# Patient Record
Sex: Male | Born: 1945
Health system: Southern US, Community
[De-identification: ages and names within clinical notes are randomized; demographics above are authoritative.]

## PROBLEM LIST (undated history)

## (undated) DIAGNOSIS — F988 Other specified behavioral and emotional disorders with onset usually occurring in childhood and adolescence: Secondary | ICD-10-CM

## (undated) DIAGNOSIS — F419 Anxiety disorder, unspecified: Secondary | ICD-10-CM

## (undated) DIAGNOSIS — E785 Hyperlipidemia, unspecified: Secondary | ICD-10-CM

## (undated) DIAGNOSIS — B019 Varicella without complication: Secondary | ICD-10-CM

## (undated) DIAGNOSIS — I10 Essential (primary) hypertension: Secondary | ICD-10-CM

## (undated) DIAGNOSIS — K635 Polyp of colon: Secondary | ICD-10-CM

## (undated) DIAGNOSIS — M1990 Unspecified osteoarthritis, unspecified site: Secondary | ICD-10-CM

## (undated) DIAGNOSIS — R011 Cardiac murmur, unspecified: Secondary | ICD-10-CM

## (undated) DIAGNOSIS — J302 Other seasonal allergic rhinitis: Secondary | ICD-10-CM

## (undated) DIAGNOSIS — Z5189 Encounter for other specified aftercare: Secondary | ICD-10-CM

## (undated) DIAGNOSIS — K8020 Calculus of gallbladder without cholecystitis without obstruction: Secondary | ICD-10-CM

## (undated) DIAGNOSIS — E039 Hypothyroidism, unspecified: Secondary | ICD-10-CM

## (undated) DIAGNOSIS — R739 Hyperglycemia, unspecified: Secondary | ICD-10-CM

## (undated) DIAGNOSIS — E119 Type 2 diabetes mellitus without complications: Secondary | ICD-10-CM

## (undated) DIAGNOSIS — K5790 Diverticulosis of intestine, part unspecified, without perforation or abscess without bleeding: Secondary | ICD-10-CM

## (undated) HISTORY — DX: Unspecified osteoarthritis, unspecified site: M19.90

## (undated) HISTORY — DX: Varicella without complication: B01.9

## (undated) HISTORY — DX: Polyp of colon: K63.5

## (undated) HISTORY — DX: Essential (primary) hypertension: I10

## (undated) HISTORY — DX: Hyperlipidemia, unspecified: E78.5

## (undated) HISTORY — DX: Hyperglycemia, unspecified: R73.9

## (undated) HISTORY — DX: Other specified behavioral and emotional disorders with onset usually occurring in childhood and adolescence: F98.8

## (undated) HISTORY — PX: OTHER SURGICAL HISTORY: SHX169

## (undated) HISTORY — DX: Anxiety disorder, unspecified: F41.9

## (undated) HISTORY — DX: Other seasonal allergic rhinitis: J30.2

## (undated) HISTORY — DX: Cardiac murmur, unspecified: R01.1

## (undated) HISTORY — DX: Calculus of gallbladder without cholecystitis without obstruction: K80.20

## (undated) HISTORY — DX: Encounter for other specified aftercare: Z51.89

## (undated) HISTORY — DX: Diverticulosis of intestine, part unspecified, without perforation or abscess without bleeding: K57.90

## (undated) HISTORY — DX: Hypothyroidism, unspecified: E03.9

## (undated) HISTORY — DX: Type 2 diabetes mellitus without complications: E11.9

---
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
## 2006-01-21 HISTORY — PX: BACK SURGERY: SHX140

## 2007-01-22 HISTORY — PX: OTHER SURGICAL HISTORY: SHX169

## 2010-08-02 ENCOUNTER — Encounter: Payer: Self-pay | Admitting: Family Medicine

## 2012-01-22 HISTORY — PX: OTHER SURGICAL HISTORY: SHX169

## 2012-01-22 HISTORY — PX: CHOLECYSTECTOMY: SHX55

## 2013-07-07 LAB — HM DEXA SCAN

## 2013-10-18 LAB — HM COLONOSCOPY

## 2015-06-30 LAB — HEPATIC FUNCTION PANEL
ALT: 40 (ref 10–40)
AST: 40 (ref 14–40)
BILIRUBIN, TOTAL: 1.3

## 2015-06-30 LAB — BASIC METABOLIC PANEL
BUN: 18 (ref 4–21)
Creatinine: 0.8 (ref 0.6–1.3)
POTASSIUM: 4.5 (ref 3.4–5.3)
Sodium: 137 (ref 137–147)

## 2016-11-01 ENCOUNTER — Encounter: Payer: Self-pay | Admitting: Family Medicine

## 2017-04-08 ENCOUNTER — Encounter: Payer: Self-pay | Admitting: Family Medicine

## 2017-04-08 LAB — HEPATIC FUNCTION PANEL
ALT: 52 — AB (ref 10–40)
AST: 42 — AB (ref 14–40)
Alkaline Phosphatase: 71 (ref 25–125)
Bilirubin, Total: 0.9

## 2017-04-08 LAB — BASIC METABOLIC PANEL
BUN: 18 (ref 4–21)
Creatinine: 0.8 (ref 0.6–1.3)
Glucose: 84
Potassium: 4 (ref 3.4–5.3)
SODIUM: 140 (ref 137–147)

## 2017-04-08 LAB — CBC AND DIFFERENTIAL
HEMATOCRIT: 45 (ref 41–53)
HEMOGLOBIN: 14.9 (ref 13.5–17.5)
Neutrophils Absolute: 3
Platelets: 243 (ref 150–399)
WBC: 5.6

## 2017-04-08 LAB — HEMOGLOBIN A1C: Hemoglobin A1C: 5.9

## 2017-04-08 LAB — PSA: PSA: 1.22

## 2017-04-08 LAB — LIPID PANEL
CHOLESTEROL: 139 (ref 0–200)
HDL: 44 (ref 35–70)
LDL Cholesterol: 84
Triglycerides: 53 (ref 40–160)

## 2017-04-08 LAB — TSH: TSH: 0.02 — AB (ref 0.41–5.90)

## 2017-05-13 ENCOUNTER — Encounter: Payer: Self-pay | Admitting: Family Medicine

## 2017-06-02 ENCOUNTER — Telehealth: Payer: Self-pay | Admitting: Surgical

## 2017-06-02 NOTE — Telephone Encounter (Signed)
This is the patient that I spoke with you about. I have scheduled him to see you on 07/07/17 at 2:45 PM. Thank you for seeing this patient. His daughter is going to come sign release of records to try to get records before appointment.

## 2017-06-02 NOTE — Telephone Encounter (Signed)
Perfect! Thanks. I am willing to accept patient.

## 2017-07-07 ENCOUNTER — Ambulatory Visit (INDEPENDENT_AMBULATORY_CARE_PROVIDER_SITE_OTHER): Payer: PPO | Admitting: Family Medicine

## 2017-07-07 ENCOUNTER — Encounter: Payer: Self-pay | Admitting: Family Medicine

## 2017-07-07 VITALS — BP 126/82 | HR 90 | Temp 98.3°F | Ht 69.0 in | Wt 171.8 lb

## 2017-07-07 DIAGNOSIS — F988 Other specified behavioral and emotional disorders with onset usually occurring in childhood and adolescence: Secondary | ICD-10-CM | POA: Diagnosis not present

## 2017-07-07 DIAGNOSIS — R739 Hyperglycemia, unspecified: Secondary | ICD-10-CM | POA: Diagnosis not present

## 2017-07-07 DIAGNOSIS — E039 Hypothyroidism, unspecified: Secondary | ICD-10-CM | POA: Insufficient documentation

## 2017-07-07 DIAGNOSIS — K635 Polyp of colon: Secondary | ICD-10-CM | POA: Insufficient documentation

## 2017-07-07 DIAGNOSIS — R011 Cardiac murmur, unspecified: Secondary | ICD-10-CM | POA: Diagnosis not present

## 2017-07-07 DIAGNOSIS — J302 Other seasonal allergic rhinitis: Secondary | ICD-10-CM | POA: Insufficient documentation

## 2017-07-07 DIAGNOSIS — I10 Essential (primary) hypertension: Secondary | ICD-10-CM | POA: Diagnosis not present

## 2017-07-07 DIAGNOSIS — M1990 Unspecified osteoarthritis, unspecified site: Secondary | ICD-10-CM | POA: Insufficient documentation

## 2017-07-07 DIAGNOSIS — Z1159 Encounter for screening for other viral diseases: Secondary | ICD-10-CM | POA: Diagnosis not present

## 2017-07-07 DIAGNOSIS — E785 Hyperlipidemia, unspecified: Secondary | ICD-10-CM | POA: Insufficient documentation

## 2017-07-07 DIAGNOSIS — Z8669 Personal history of other diseases of the nervous system and sense organs: Secondary | ICD-10-CM | POA: Diagnosis not present

## 2017-07-07 NOTE — Assessment & Plan Note (Signed)
S:States neurology diagnosed and psychologist confirmed. adderall 30mg - 15mg  BID prescribed by prior pcp A/P: we will try to get records. discussed controlled substance contract, UDS, need for original diagnosis by psychologist or psychiatrist

## 2017-07-07 NOTE — Progress Notes (Signed)
Phone: [phone]  Subjective:  Patient presents today to establish care.  Prior patient in [place], [place]. Chief complaint-noted.   See problem oriented charting  The following were reviewed and entered/updated in epic: Past Medical History:  Diagnosis Date  . Arthritis   . Attention deficit disorder (ADD) in adult    Adderall 30mg  tablet- half tablet twice a day.   . Chicken pox   . Colon polyp    dad died of colon cancer- q5 year colonoscopy. 2018  . Encounter for blood transfusion    related to overtreatment with PCN. became very ill.   [name] Kitchen Heart murmur    Sees cardiology October each year due to murmur after fall- get records. Seen for heart murmur heard 2010. We discused may not need to see cardiology. Every other year stres test  . Hyperglycemia    a1c 5.9 in 03/2017  . Hyperlipidemia    atorvastatin 40mg   . Hypertension    losartan 50, hctz 12.5mg   . Hypothyroidism    levothyroxine 100 mcg 6 days a week as of mid 2019.   . Seasonal allergies    prn antihistamine   Patient Active Problem List   Diagnosis Date Noted  . Attention deficit disorder (ADD) in adult     Priority: High  . Hypertension     Priority: Medium  . Hyperlipidemia     Priority: Medium  . Hypothyroidism     Priority: Medium  . Hyperglycemia     Priority: Medium  . Arthritis 07/07/2017  . Heart murmur   . Seasonal allergies   . Colon polyp    Past Surgical History:  Procedure Laterality Date  . Arthroscopic surgery Left 1986-1996   Damaged cartilage Left knee  . BACK SURGERY Bilateral 2008   Injury due to a fall. 2 fractured vertebrae, 3 fractured ribs fracture L scapulla, mild concussion  . CHOLECYSTECTOMY  2014  . Relocation surgery Right    For right elbow ulna nerve   . REPAIR of torn retina Right 2014  . TONSILECTOMY, ADENOIDECTOMY, BILATERAL MYRINGOTOMY AND TUBES  1958  . vena Laser   2009   Remaoval of varicous veins    Family History  Problem Relation Age of Onset  .  Arthritis Mother        passed at age 34  . Miscarriages / [place] Mother   . Hearing loss Father   . Colon cancer Father        died at 54  . Other Daughter        diabetes educator  . Heart disease Paternal Grandmother   . Miscarriages / [place] Daughter   . Diabetes Mellitus I Daughter        in [place]    Medications- reviewed and updated Current Outpatient Medications  Medication Sig Dispense Refill  . amphetamine-dextroamphetamine (ADDERALL XR) 30 MG 24 hr capsule Take 30 mg by mouth daily.    [name] Kitchen atorvastatin (LIPITOR) 40 MG tablet Take 40 mg by mouth daily.    . calcium carbonate (TUMS - DOSED IN MG ELEMENTAL CALCIUM) 500 MG chewable tablet Chew 1 tablet by mouth daily.    . CHONDROITIN SULFATE PO Take 200 mg by mouth daily.    . DHA-EPA-GLA PO Take 644 mg by mouth daily.    . diclofenac sodium (VOLTAREN) 1 % GEL Apply topically at bedtime.    . Glucosamine HCl 1500 MG TABS Take 1,500 mg by mouth daily.    [name] Kitchen Hyaluronic Acid-Vitamin C (HYALURONIC  ACID PO) Take 3.3 mg by mouth daily.    . Investigational vitamin D 600 UNITS capsule SWOG [id number] Take 625 Units by mouth daily. Take with food.    [name] Kitchen levothyroxine (SYNTHROID, LEVOTHROID) 100 MCG tablet Take 100 mcg by mouth daily before breakfast.    . losartan-hydrochlorothiazide (HYZAAR) 50-12.5 MG tablet Take 1 tablet by mouth daily.    . Magnesium Gluconate (MAGNESIUM 27) 500 (27 Mg) MG TABS Take 20 mg by mouth daily.    . Methylsulfonylmethane (MSM) 750 MG CAPS Take 750 mg by mouth daily.    [name] Kitchen VITAMIN A PO Take 120 mcg by mouth daily.     No current facility-administered medications for this visit.     Allergies-reviewed and updated Not on File  Social History   Social History Narrative   Married. Twin daughters. 4 grandkids (2 each)   Moved from [place] [employer] [place], [place]      Retired from Conservation officer, historic buildings      Hobbies: time with grandkids, enjoys travel    ROS--Full ROS was completed Review of  Systems  Constitutional: Negative for chills and fever.  HENT: Negative for ear discharge and ear pain.   Eyes: Negative for blurred vision and double vision.  Respiratory: Negative for cough and shortness of breath.   Cardiovascular: Negative for chest pain and palpitations.  Gastrointestinal: Negative for abdominal pain and vomiting.  Genitourinary: Negative for dysuria and urgency.  Musculoskeletal: Positive for joint pain. Negative for falls.  Skin: Negative for itching and rash.  Neurological: Negative for tingling and tremors.  Endo/Heme/Allergies: Negative for polydipsia. Does not bruise/bleed easily.  Psychiatric/Behavioral: Negative for hallucinations and substance abuse.   Objective: BP 126/82 (BP Location: Left Arm, Patient Position: Sitting, Cuff Size: Normal)   Pulse 90   Temp 98.3 F (36.8 C) (Oral)   Ht 5\' 9"  (1.753 m)   Wt 171 lb 12.8 oz (77.9 kg)   SpO2 97%   BMI 25.37 kg/m  Gen: NAD, resting comfortably HEENT: Mucous membranes are moist. Oropharynx normal. TM normal. Eyes: sclera and lids normal, PERRLA Neck: no thyromegaly, no cervical lymphadenopathy CV: RRR other than occasional ectopic beat. no murmurs rubs or gallops Lungs: CTAB no crackles, wheeze, rhonchi Abdomen: soft/nontender/nondistended/normal bowel sounds. No rebound or guarding.  Ext: no edema Skin: warm, dry Neuro: 5/5 strength in upper and lower extremities, normal gait, normal reflexes  Assessment/Plan:  Other notes: 1.Patient has been trying to get records from  [facility] in [place], [place]. Epic would not let me request records from outside vendor/careeverywhere. Patient reports website [email or web address]. Hoping we have records by follow up 2. Patient states Gets blood work 6 months and due  sept 2019- tsh, lipids, liver) 3. Reports 03/2017 lumbar, thoracic, cervical films- t11 compression fracture s/p post plate and screw fixation, multilevel cervical degerenative disc  disease, some degenerative disc disease lumbar spine 4. High LFTs- Vitamin E per Dr. Almond Lint 800 IU per day and PCP continued in 2014. Patient wants to continue  Hypertension S: controlled on losartan 50, hctz 12.5mg . Does not want combo pill BP Readings from Last 3 Encounters:  07/07/17 126/82  A/P: We discussed blood pressure goal of <140/90. Continue current meds    Hyperlipidemia S:  controlled on atorvastatin 40mg . Last LDL was 84  A/P: continue current medication. We discussed LFTs being checked with this- he has had to drop atorvastatin level in the past due to LFT elevation and apparently  took vitamin E     Hypothyroidism S: On thyroid medication-levothyroxine 100 mcg 6 days a week- was on 7 days a week but tsh was low A/P: update tsh today  Attention deficit disorder (ADD) in adult S:States neurology diagnosed and psychologist confirmed. adderall 30mg - 15mg  BID prescribed by prior pcp A/P: we will try to get records. discussed controlled substance contract, UDS, need for original diagnosis by psychologist or psychiatrist   Hyperglycemia S:a1c 5.9 in 03/2017.  Has been as high as 6.1 in recent years A/P: Likely update A1c with labs next visit   Heart murmur Sees cardiology October each year due to murmur after fall- get records. Seen for heart murmur heard 2010. We discused may not need to see cardiology. Every other year stress test reported need futher history to determine if needed in future. He seems strongly interested in seeing cardiology  He also mentions possible irregular heartbeat-denies atrial fibrillation-we will get history from records.   Future Appointments  Date Time Provider [place]  10/10/2017  8:15 AM [name], MD [facility]   Return in about 3 months (around 10/07/2017) for physical, come fasting.   Lab/Order associations: History of retinal tear - Plan: Ambulatory referral to Ophthalmology  Hypothyroidism, unspecified  type - Plan: TSH  Encounter for hepatitis C screening test for low risk patient - Plan: Hepatitis C antibody  Return precautions advised. [name], MD

## 2017-07-07 NOTE — Assessment & Plan Note (Signed)
S:  controlled on atorvastatin 40mg . Last LDL was 84  A/P: continue current medication. We discussed LFTs being checked with this- he has had to drop atorvastatin level in the past due to LFT elevation and apparently took vitamin E

## 2017-07-07 NOTE — Assessment & Plan Note (Signed)
S: controlled on losartan 50, hctz 12.5mg . Does not want combo pill BP Readings from Last 3 Encounters:  07/07/17 126/82  A/P: We discussed blood pressure goal of <140/90. Continue current meds

## 2017-07-07 NOTE — Assessment & Plan Note (Signed)
S:a1c 5.9 in 03/2017.  Has been as high as 6.1 in recent years A/P: Likely update A1c with labs next visit

## 2017-07-07 NOTE — Patient Instructions (Addendum)
Health Maintenance Due  Topic Date Due  . Hepatitis C Screening - Today at office visit 11-Feb-1945  . TETANUS/TDAP -Patient Report 2015. Will get records 08/27/1963  . COLONOSCOPY - Patient reported 2018. Will get records 08/27/1994  . PNA vac Low Risk Adult (1 of 2 - PCV13) - Patient Reported 2015. Will get records 08/26/2009   We will call you within two weeks about your referral to Dr. Zenia Resides office. If you do not hear within 3 weeks, give [place] a call.   Please stop by lab before you go

## 2017-07-07 NOTE — Assessment & Plan Note (Addendum)
Sees cardiology October each year due to murmur after fall- get records. Seen for heart murmur heard 2010. We discused may not need to see cardiology. Every other year stress test reported need futher history to determine if needed in future. He seems strongly interested in seeing cardiology  He also mentions possible irregular heartbeat-denies atrial fibrillation-we will get history from records.

## 2017-07-07 NOTE — Assessment & Plan Note (Signed)
S: On thyroid medication-levothyroxine 100 mcg 6 days a week- was on 7 days a week but tsh was low A/P: update tsh today

## 2017-07-08 LAB — HEPATITIS C ANTIBODY
HEP C AB: NONREACTIVE
SIGNAL TO CUT-OFF: 0.04 (ref <1.00)

## 2017-07-08 LAB — TSH: TSH: 0.06 u[IU]/mL — AB (ref 0.35–4.50)

## 2017-07-08 NOTE — Progress Notes (Signed)
Your Hepatitis C test was negative.   Thyroid remains slightly overtreated. Please adjust levothyroxine to 75 mcg and help her set up a repeat tsh under hypothyroidism in 6-8 weeks. Please discontinue his 100 mcg dose.

## 2017-07-10 ENCOUNTER — Other Ambulatory Visit: Payer: Self-pay

## 2017-07-10 ENCOUNTER — Encounter: Payer: Self-pay | Admitting: Family Medicine

## 2017-07-10 DIAGNOSIS — E0590 Thyrotoxicosis, unspecified without thyrotoxic crisis or storm: Secondary | ICD-10-CM

## 2017-07-10 MED ORDER — LEVOTHYROXINE SODIUM 75 MCG PO TABS: 75.0000 ug | ORAL_TABLET | Freq: Every day | ORAL | 3 refills | Status: DC

## 2017-07-10 NOTE — Telephone Encounter (Signed)
I have adjusted the records request for the patient and had a front office representative at [facility] scan the document in the system. Request has been faxed to the information provided by the patient.

## 2017-07-17 ENCOUNTER — Encounter: Payer: Self-pay | Admitting: Family Medicine

## 2017-08-07 ENCOUNTER — Encounter (INDEPENDENT_AMBULATORY_CARE_PROVIDER_SITE_OTHER): Payer: PPO | Admitting: Ophthalmology

## 2017-08-07 DIAGNOSIS — I10 Essential (primary) hypertension: Secondary | ICD-10-CM | POA: Diagnosis not present

## 2017-08-07 DIAGNOSIS — H2513 Age-related nuclear cataract, bilateral: Secondary | ICD-10-CM | POA: Diagnosis not present

## 2017-08-07 DIAGNOSIS — H33021 Retinal detachment with multiple breaks, right eye: Secondary | ICD-10-CM | POA: Diagnosis not present

## 2017-08-07 DIAGNOSIS — H43813 Vitreous degeneration, bilateral: Secondary | ICD-10-CM | POA: Diagnosis not present

## 2017-08-07 DIAGNOSIS — H35033 Hypertensive retinopathy, bilateral: Secondary | ICD-10-CM | POA: Diagnosis not present

## 2017-08-12 ENCOUNTER — Encounter: Payer: Self-pay | Admitting: Family Medicine

## 2017-08-13 ENCOUNTER — Other Ambulatory Visit: Payer: Self-pay

## 2017-08-13 DIAGNOSIS — Z0100 Encounter for examination of eyes and vision without abnormal findings: Secondary | ICD-10-CM

## 2017-08-21 ENCOUNTER — Other Ambulatory Visit: Payer: PPO

## 2017-08-29 ENCOUNTER — Other Ambulatory Visit (INDEPENDENT_AMBULATORY_CARE_PROVIDER_SITE_OTHER): Payer: PPO

## 2017-08-29 DIAGNOSIS — E0590 Thyrotoxicosis, unspecified without thyrotoxic crisis or storm: Secondary | ICD-10-CM | POA: Diagnosis not present

## 2017-08-29 LAB — TSH: TSH: 1.02 u[IU]/mL (ref 0.35–4.50)

## 2017-09-06 ENCOUNTER — Encounter: Payer: Self-pay | Admitting: Family Medicine

## 2017-09-08 ENCOUNTER — Encounter: Payer: Self-pay | Admitting: Family Medicine

## 2017-09-08 ENCOUNTER — Other Ambulatory Visit: Payer: Self-pay

## 2017-09-08 MED ORDER — AMPHETAMINE-DEXTROAMPHETAMINE 30 MG PO TABS: 15.0000 mg | ORAL_TABLET | Freq: Two times a day (BID) | ORAL | 0 refills | Status: DC

## 2017-09-08 MED ORDER — LOSARTAN POTASSIUM-HCTZ 50-12.5 MG PO TABS: 1.0000 | ORAL_TABLET | Freq: Every day | ORAL | 3 refills | Status: DC

## 2017-09-08 NOTE — Telephone Encounter (Signed)
Pt last seen 07/07/2017. See message.

## 2017-09-11 ENCOUNTER — Other Ambulatory Visit: Payer: Self-pay

## 2017-09-11 MED ORDER — LOSARTAN POTASSIUM 50 MG PO TABS: 50.0000 mg | ORAL_TABLET | Freq: Every day | ORAL | 3 refills | Status: DC

## 2017-09-11 MED ORDER — HYDROCHLOROTHIAZIDE 12.5 MG PO TABS: 12.5000 mg | ORAL_TABLET | Freq: Every day | ORAL | 3 refills | Status: DC

## 2017-09-25 ENCOUNTER — Encounter: Payer: Self-pay | Admitting: Family Medicine

## 2017-10-07 ENCOUNTER — Encounter: Payer: Self-pay | Admitting: Family Medicine

## 2017-10-10 ENCOUNTER — Encounter: Payer: PPO | Admitting: Family Medicine

## 2017-10-10 ENCOUNTER — Ambulatory Visit (INDEPENDENT_AMBULATORY_CARE_PROVIDER_SITE_OTHER): Payer: PPO

## 2017-10-10 DIAGNOSIS — Z23 Encounter for immunization: Secondary | ICD-10-CM | POA: Diagnosis not present

## 2017-10-10 NOTE — Patient Instructions (Signed)
Health Maintenance Due  Topic Date Due  . TETANUS/TDAP  08/27/1963  . COLONOSCOPY  08/27/1994  . PNA vac Low Risk Adult (1 of 2 - PCV13) 08/26/2009    No flowsheet data found.

## 2017-10-10 NOTE — Progress Notes (Signed)
Patient in today for Flu shot. VIS given. Administered in left arm. Patient tolerated well

## 2017-10-13 ENCOUNTER — Ambulatory Visit: Payer: PPO

## 2017-10-23 ENCOUNTER — Ambulatory Visit (INDEPENDENT_AMBULATORY_CARE_PROVIDER_SITE_OTHER): Payer: PPO | Admitting: Family Medicine

## 2017-10-23 ENCOUNTER — Encounter: Payer: Self-pay | Admitting: Family Medicine

## 2017-10-23 VITALS — BP 138/78 | HR 71 | Temp 98.0°F | Ht 69.0 in | Wt 174.8 lb

## 2017-10-23 DIAGNOSIS — Z0000 Encounter for general adult medical examination without abnormal findings: Secondary | ICD-10-CM

## 2017-10-23 DIAGNOSIS — Z125 Encounter for screening for malignant neoplasm of prostate: Secondary | ICD-10-CM

## 2017-10-23 DIAGNOSIS — R739 Hyperglycemia, unspecified: Secondary | ICD-10-CM | POA: Diagnosis not present

## 2017-10-23 DIAGNOSIS — R011 Cardiac murmur, unspecified: Secondary | ICD-10-CM

## 2017-10-23 DIAGNOSIS — Z79899 Other long term (current) drug therapy: Secondary | ICD-10-CM | POA: Diagnosis not present

## 2017-10-23 DIAGNOSIS — E039 Hypothyroidism, unspecified: Secondary | ICD-10-CM | POA: Diagnosis not present

## 2017-10-23 DIAGNOSIS — E785 Hyperlipidemia, unspecified: Secondary | ICD-10-CM

## 2017-10-23 DIAGNOSIS — M79671 Pain in right foot: Secondary | ICD-10-CM

## 2017-10-23 LAB — LIPID PANEL
Cholesterol: 131 mg/dL (ref 0–200)
HDL: 42.9 mg/dL (ref >39.00)
LDL Cholesterol: 73 mg/dL (ref 0–99)
NONHDL: 88.59
Total CHOL/HDL Ratio: 3
Triglycerides: 79 mg/dL (ref 0.0–149.0)
VLDL: 15.8 mg/dL (ref 0.0–40.0)

## 2017-10-23 LAB — COMPREHENSIVE METABOLIC PANEL
ALK PHOS: 49 U/L (ref 39–117)
ALT: 39 U/L (ref 0–53)
AST: 40 U/L — AB (ref 0–37)
Albumin: 4.1 g/dL (ref 3.5–5.2)
BILIRUBIN TOTAL: 1 mg/dL (ref 0.2–1.2)
BUN: 17 mg/dL (ref 6–23)
CO2: 35 mEq/L — ABNORMAL HIGH (ref 19–32)
CREATININE: 1 mg/dL (ref 0.40–1.50)
Calcium: 9 mg/dL (ref 8.4–10.5)
Chloride: 99 mEq/L (ref 96–112)
GFR: 77.82 mL/min (ref >60.00)
GLUCOSE: 89 mg/dL (ref 70–99)
Potassium: 3.9 mEq/L (ref 3.5–5.1)
SODIUM: 137 meq/L (ref 135–145)
TOTAL PROTEIN: 6.9 g/dL (ref 6.0–8.3)

## 2017-10-23 LAB — CBC
HCT: 44.8 % (ref 39.0–52.0)
HEMOGLOBIN: 15.1 g/dL (ref 13.0–17.0)
MCHC: 33.7 g/dL (ref 30.0–36.0)
MCV: 89.9 fl (ref 78.0–100.0)
Platelets: 259 10*3/uL (ref 150.0–400.0)
RBC: 4.98 Mil/uL (ref 4.22–5.81)
RDW: 13.9 % (ref 11.5–15.5)
WBC: 5.2 10*3/uL (ref 4.0–10.5)

## 2017-10-23 LAB — TSH: TSH: 0.91 u[IU]/mL (ref 0.35–4.50)

## 2017-10-23 LAB — HEMOGLOBIN A1C: Hgb A1c MFr Bld: 6 % (ref 4.6–6.5)

## 2017-10-23 MED ORDER — ATORVASTATIN CALCIUM 40 MG PO TABS: 40.0000 mg | ORAL_TABLET | Freq: Every day | ORAL | 3 refills | Status: DC

## 2017-10-23 NOTE — Progress Notes (Addendum)
Phone: [phone]  Subjective:  Patient presents today for their annual physical. Chief complaint-noted.   See problem oriented charting- ROS- full  review of systems was completed and negative except for: post nasal drip  The following were reviewed and entered/updated in epic: Past Medical History:  Diagnosis Date  . Arthritis   . Attention deficit disorder (ADD) in adult    Adderall 53m tablet- half tablet twice a day.   . Chicken pox   . Colon polyp    dad died of colon cancer- q5 year colonoscopy. 2018  . Encounter for blood transfusion    related to overtreatment with PCN. became very ill.   .[name] KitchenHeart murmur    Sees cardiology October each year due to murmur after fall- get records. Seen for heart murmur heard 2010. We discused may not need to see cardiology. Every other year stres test  . Hyperglycemia    a1c 5.9 in 03/2017  . Hyperlipidemia    atorvastatin 459m . Hypertension    losartan 50, hctz 12.42m442m. Hypothyroidism    levothyroxine 100 mcg 6 days a week as of mid 2019.   . Seasonal allergies    prn antihistamine   Patient Active Problem List   Diagnosis Date Noted  . Attention deficit disorder (ADD) in adult     Priority: High  . Heart murmur     Priority: High  . Arthritis 07/07/2017    Priority: Medium  . Hypertension     Priority: Medium  . Hyperlipidemia     Priority: Medium  . Hypothyroidism     Priority: Medium  . Hyperglycemia     Priority: Medium  . Seasonal allergies     Priority: Low  . Colon polyp     Priority: Low   Past Surgical History:  Procedure Laterality Date  . Arthroscopic surgery Left 1986-1996   Damaged cartilage Left knee  . BACK SURGERY Bilateral 2008   Injury due to a fall. 2 fractured vertebrae, 3 fractured ribs fracture L scapulla, mild concussion  . CHOLECYSTECTOMY  2014  . Relocation surgery Right    For right elbow ulna nerve   . REPAIR of torn retina Right 2014  . TONSILECTOMY, ADENOIDECTOMY, BILATERAL  MYRINGOTOMY AND TUBES  1958  . vena Laser   2009   Remaoval of varicous veins    Family History  Problem Relation Age of Onset  . Arthritis Mother        passed at age 67 49 Miscarriages / [place]   . Hearing loss Father   . Colon cancer Father        died at 69 78 Other Daughter        diabetes educator  . Heart disease Paternal Grandmother   . Miscarriages / [place]   . Diabetes Mellitus [place] Daughter        [place] Medications- reviewed and updated Current Outpatient Medications  Medication Sig Dispense Refill  . aspirin EC 81 MG tablet Take 81 mg by mouth daily.    . aMarland Kitchenorvastatin (LIPITOR) 40 MG tablet Take 40 mg by mouth daily.    . calcium carbonate (TUMS - DOSED IN MG ELEMENTAL CALCIUM) 500 MG chewable tablet Chew 1 tablet by mouth daily.    . CHONDROITIN SULFATE PO Take 200 mg by mouth daily.    . DHA-EPA-GLA PO Take 690 mg by mouth daily.     . diclofenac sodium (VOLTAREN) 1 % GEL  Apply topically at bedtime.    . Glucosamine HCl 1500 MG TABS Take 1,500 mg by mouth daily.    [name] Kitchen Hyaluronic Acid-Vitamin C (HYALURONIC ACID PO) Take 3.3 mg by mouth daily.    . hydrochlorothiazide (HYDRODIURIL) 12.5 MG tablet Take 1 tablet (12.5 mg total) by mouth daily. 90 tablet 3  . Investigational vitamin D 600 UNITS capsule SWOG S0812 Take 625 Units by mouth daily. Take with food.    [name] Kitchen levothyroxine (SYNTHROID, LEVOTHROID) 75 MCG tablet Take 1 tablet (75 mcg total) by mouth daily. 90 tablet 3  . losartan (COZAAR) 50 MG tablet Take 1 tablet (50 mg total) by mouth daily. 90 tablet 3  . Magnesium Gluconate (MAGNESIUM 27) 500 (27 Mg) MG TABS Take 20 mg by mouth daily.    . Misc Natural Products (TART CHERRY ADVANCED PO) Take 800 mg by mouth daily.    . Probiotic Product (PROBIOTIC ADVANCED PO) Take 1 tablet by mouth daily.    . vitamin E 400 UNIT capsule Take 800 Units by mouth daily.    [name] Kitchen amphetamine-dextroamphetamine (ADDERALL) 30 MG tablet Take 0.5 tablets by  mouth 2 (two) times daily. 30 tablet 0  . amphetamine-dextroamphetamine (ADDERALL) 30 MG tablet Take 0.5 tablets by mouth 2 (two) times daily. 30 tablet 0  . amphetamine-dextroamphetamine (ADDERALL) 30 MG tablet Take 0.5 tablets by mouth 2 (two) times daily. 30 tablet 0   No current facility-administered medications for this visit.     Allergies-reviewed and updated No Known Allergies  Social History   Social History Narrative   Married. Twin daughters. 4 grandkids (2 each)   Moved from [place], [place]      Retired from Production manager: time with grandkids, enjoys travel    Objective: BP 138/78   Pulse 71   Temp 98 F (36.7 C) (Oral)   Ht '5\' 9"'$  (1.753 m)   Wt 174 lb 12.8 oz (79.3 kg)   SpO2 98%   BMI 25.81 kg/m  Gen: NAD, resting comfortably HEENT: Mucous membranes are moist. Oropharynx normal Neck: no thyromegaly CV: RRR no murmurs rubs or gallops Lungs: CTAB no crackles, wheeze, rhonchi Abdomen: soft/nontender/nondistended/normal bowel sounds. No rebound or guarding.  Ext: no edema Skin: warm, dry Neuro: grossly normal, moves all extremities, PERRLA MSK: corn noted at 1st MTP right foot- mild pain outside of this   Rectal deferred  Assessment/Plan:  72 y.o. male presenting for annual physical.  Health Maintenance counseling: 1. Anticipatory guidance: Patient counseled regarding regular dental exams -q6 months (looking at options), eye exams -yearly with [facility], regular optho later this month with [facility], wearing seatbelts.  2. Risk factor reduction:  Advised patient of need for regular exercise and diet rich and fruits and vegetables to reduce risk of heart attack and stroke. Exercise- active in house and yard- no intentional exercise- encouraged him to start this. Diet-Weight up 3 lbs since last visit, thinks weight up due to being slightly less active.  Wt Readings from Last 3 Encounters:  10/23/17 174 lb 12.8 oz  (79.3 kg)  07/07/17 171 lb 12.8 oz (77.9 kg)  3. Immunizations/screenings/ancillary studies- discussed shingrix - had first and will get repeat in 2-6 months..  Immunization History  Administered Date(s) Administered  . Influenza, High Dose Seasonal PF 10/10/2017  . MMR 04/14/2013  . Pneumococcal Conjugate-13 07/01/2013  . Pneumococcal Polysaccharide-23 01/03/2012  . Tdap 05/17/2013  . Zoster 08/02/2010  . Zoster Recombinat (  Shingrix) 10/12/2017  4. Prostate cancer screening- he would like to do at least once a year- we discussed guidelines stop at 70- sounds like he at least wants to go through 75.  PSA trend is low risk- will opt out of rectal until next CPE Lab Results  Component Value Date   PSA 1.22 04/08/2017   5. Colon cancer screening - 2018 colonoscopy with 3 year repeat per prior MD due to father with colon cancer. He will want to do that locally.  6. Skin cancer screening- looking at options for dermatology locally. advised regular sunscreen use. Denies worrisome, changing, or new skin lesions.   Status of chronic or acute concerns   Adult ADD- patient is on 15 mg BID of adderall (splits 45m tablet). This is controlling ADD well- can call in 2 months for next refill -Dr. HBlane Oharaof psychology diagnosed him.  - controlled substance contract today  [place] 1[place] rx through me has been filled  HTN- controlled on hctz 12.574mand losartan 5039mOn repeat- high normal so want to add exercise to get lower on ADD meds. Does not want combo pill.   HLD- on atorvastatin 68m37mpdate lipid panel. Had to drop statin in past due to LFT elevations. Wants to monitor q6 months. Has had liver ultrasound- apparently had gallstone and had to have gallbladder removed  Hypothyroidism- controlled on 75 mcg levothyroxine. Had been on 100 mcg 6 days a week per prior MD  Hyperglycemia- a1c 5.29 March 2017. Update today, encouraged exercise  Heart murmur- has seen  cardiology each October. Echo 10/2016 shows mild mitral regurgitation only. I told him he   Right foot pain S: ruptured tibialis anterior tendon. Has been through PT. Dr. MeekBlanchie Serve prior PCP sent him to orthopedist- CathPhilomena Dohenyatient had molded arch supports made as well as AFO brace- he couldn't wear the AFO. Apparently pronation was cast into mold- started to get corns and callouses at 1st MTP joint. He has customized his own insoles A/P:  Can continue his own custom insoles for now but will refer to podiatry for further evaluation and consideration of new custom insoles.   Future Appointments  Date Time Provider [place]24/2020  8:00 AM MattHayden Pedro [facility] None   Return in about 6 months (around 04/24/2018).  Lab/Order associations: Fasting Preventative health care - Plan: CBC, Comprehensive metabolic panel, Lipid panel, Hemoglobin A1c, TSH, Pain Mgmt, Profile 8 w/Conf, U  Hyperlipidemia, unspecified hyperlipidemia type - Plan: CBC, Comprehensive metabolic panel, Lipid panel  Right foot pain - Plan: Ambulatory referral to Sports Medicine  Screening for prostate cancer  Hyperglycemia - Plan: Hemoglobin A1c  Hypothyroidism, unspecified type - Plan: TSH  High risk medication use - Plan: Pain Mgmt, Profile 8 w/Conf, U  Heart murmur - Plan: Ambulatory referral to Cardiology  Return precautions advised.  StepGarret Reddish

## 2017-10-23 NOTE — Patient Instructions (Addendum)
Please stop by lab before you go  We will call you within two weeks about your referral to Dr. Caryl Comes (they may put you with a different cardiologist but I did specifically request him). If you do not hear within 3 weeks, give [place] a call.   Please schedule a visit with our excellent sports medicine physician Dr. Paulla Fore before you leave at the check out desk so he can further evaluate your right foot pain/callous formation/potential inserts needs  Lets work on exercise to see if we can keep BP even lower so we can remain on ADD meds  If I dont write anything about your atorvastatin new prescription on mychart- send me a message back   Team- please completed controlled substance contract for ADD meds

## 2017-10-23 NOTE — Addendum Note (Signed)
Addended by: [name] on: 10/23/2017 09:07 PM   Modules accepted: Orders

## 2017-10-26 LAB — PAIN MGMT, PROFILE 8 W/CONF, U
6 Acetylmorphine: NEGATIVE ng/mL (ref <10)
ALCOHOL METABOLITES: NEGATIVE ng/mL (ref <500)
Amphetamine: 2485 ng/mL — ABNORMAL HIGH (ref <250)
Amphetamines: POSITIVE ng/mL — AB (ref <500)
BENZODIAZEPINES: NEGATIVE ng/mL (ref <100)
Buprenorphine, Urine: NEGATIVE ng/mL (ref <5)
CREATININE: 135.3 mg/dL
Cocaine Metabolite: NEGATIVE ng/mL (ref <150)
MDMA: NEGATIVE ng/mL (ref <500)
METHAMPHETAMINE: NEGATIVE ng/mL (ref <250)
Marijuana Metabolite: NEGATIVE ng/mL (ref <20)
OXYCODONE: NEGATIVE ng/mL (ref <100)
Opiates: NEGATIVE ng/mL (ref <100)
Oxidant: NEGATIVE ug/mL (ref <200)
PH: 6.84 (ref 4.5–9.0)

## 2017-10-30 ENCOUNTER — Encounter: Payer: Self-pay | Admitting: Sports Medicine

## 2017-10-30 ENCOUNTER — Ambulatory Visit (INDEPENDENT_AMBULATORY_CARE_PROVIDER_SITE_OTHER): Payer: PPO | Admitting: Sports Medicine

## 2017-10-30 VITALS — BP 140/84 | HR 78 | Ht 69.0 in | Wt 178.0 lb

## 2017-10-30 DIAGNOSIS — M7741 Metatarsalgia, right foot: Secondary | ICD-10-CM | POA: Diagnosis not present

## 2017-10-30 DIAGNOSIS — M216X1 Other acquired deformities of right foot: Secondary | ICD-10-CM | POA: Diagnosis not present

## 2017-10-30 DIAGNOSIS — S86811S Strain of other muscle(s) and tendon(s) at lower leg level, right leg, sequela: Secondary | ICD-10-CM

## 2017-10-30 DIAGNOSIS — S86811A Strain of other muscle(s) and tendon(s) at lower leg level, right leg, initial encounter: Secondary | ICD-10-CM | POA: Insufficient documentation

## 2017-10-30 NOTE — Progress Notes (Signed)
Juanda Bond. Cabria Micalizzi, [place] at [facility] [phone]  Marvell Fuller Leevon Upperman. - 72 y.o. male MRN [id number]  Date of birth: 10-25-1945  Visit Date: 10/30/2017  PCP: [name], MD   Referred by: [name], MD   Scribe(s) for today's visit: Josepha Pigg, CMA  SUBJECTIVE:  Aundria Mems. is here for Follow-up (R foot pain)  Referred by: Dr. [name]  HPI: His R foot pain symptoms INITIALLY: Began 2016-2017 and began after tibialis anterior tendon rupture.  Described as mild-severe pain depending on orthotics and activities. He reports a lot of pain when walking while wearing AFO brace.   Worsened with bad insoles.  Improved with appropriate pressure on the ball his foot.  Additional associated symptoms include: Hx of ruptured tibialis anterior tendon, tried AFO brace, caused corns and calloses at first MTP, tenderness to touch skin. He also reports sensitivity on the top of his foot at times. He denies pain when bare footed. Dr. Frann Rider (orhto) said that he was beyond tendon repair.     At this time symptoms are improving compared to onset. He just made adj to current insoles and so fare sx have improved. He reports that this has happened in the past with other adjustments with his insoles but he normally only gets about 1 week of relief until the pain returns.  He has been to PT, tried molded arch supports. He has been wearing a corn pad recently and this seems to have helped with his corns. His current insole does help with pronation. Before he made the last adjustment he started noticing that the pain was spreading across his foot.    REVIEW OF SYSTEMS: Denies night time disturbances. Denies fevers, chills, or night sweats. Denies unexplained weight loss. Denies personal history of cancer. Denies changes in bowel or bladder habits. Denies recent unreported falls. Denies new or worsening dyspnea or  wheezing. Denies headaches or dizziness.  Denies numbness, tingling or weakness  In the extremities.  Denies dizziness or presyncopal episodes Denies lower extremity edema    HISTORY:  Prior history reviewed and updated per electronic medical record.  Social History   Occupational History  . Occupation: Retired   Tobacco Use  . Smoking status: Never Smoker  . Smokeless tobacco: Never Used  Substance and Sexual Activity  . Alcohol use: Not Currently  . Drug use: Never  . Sexual activity: Not Currently   Social History   Social History Narrative   Married. Twin daughters. 4 grandkids (2 each)   Moved from [place], [place]      Retired from Conservation officer, historic buildings      Hobbies: time with grandkids, enjoys travel     Past Medical History:  Diagnosis Date  . Arthritis   . Attention deficit disorder (ADD) in adult    Adderall 30mg  tablet- half tablet twice a day.   . Chicken pox   . Colon polyp    dad died of colon cancer- q5 year colonoscopy. 2018  . Encounter for blood transfusion    related to overtreatment with PCN. became very ill.   [name] Kitchen Heart murmur    Sees cardiology October each year due to murmur after fall- get records. Seen for heart murmur heard 2010. We discused may not need to see cardiology. Every other year stres test  . Hyperglycemia    a1c 5.9 in 03/2017  . Hyperlipidemia  atorvastatin 40mg   . Hypertension    losartan 50, hctz 12.5mg   . Hypothyroidism    levothyroxine 100 mcg 6 days a week as of mid 2019.   . Seasonal allergies    prn antihistamine   Past Surgical History:  Procedure Laterality Date  . Arthroscopic surgery Left 1986-1996   Damaged cartilage Left knee  . BACK SURGERY Bilateral 2008   Injury due to a fall. 2 fractured vertebrae, 3 fractured ribs fracture L scapulla, mild concussion  . CHOLECYSTECTOMY  2014  . Relocation surgery Right    For right elbow ulna nerve   . REPAIR of torn retina Right 2014  . TONSILECTOMY,  ADENOIDECTOMY, BILATERAL MYRINGOTOMY AND TUBES  1958  . vena Laser   2009   Remaoval of varicous veins   family history includes Arthritis in his mother; Colon cancer in his father; Diabetes Mellitus I in his daughter; Hearing loss in his father; Heart disease in his paternal grandmother; 58 / [place] in his daughter and mother; Other in his daughter.  DATA OBTAINED & REVIEWED:   Recent Labs    04/08/17 10/23/17 0858  HGBA1C 5.9 6.0   .   OBJECTIVE:  VS:  HT:5\' 9"  (175.3 cm)   WT:178 lb (80.7 kg)  BMI:26.27    BP:140/84  HR:78bpm  TEMP: ( )  RESP:97 %   PHYSICAL EXAM: CONSTITUTIONAL: Well-developed, Well-nourished and In no acute distress PSYCHIATRIC: Alert & appropriately interactive. and Not depressed or anxious appearing. RESPIRATORY: No increased work of breathing and Trachea Midline EYES: Pupils are equal., EOM intact without nystagmus. and No scleral icterus.  VASCULAR EXAM: Warm and well perfused NEURO: unremarkable  MSK Exam: Right Foot & Ankle Exam: No significant rashes/lesions/ulcerations overlying the examined area No overlying erythema/ecchymosis.  normal nails without lesions General Alignment: Normal alignment & Contours Long Arch: High, rigid Hind Foot Alignment: Normal Posterior Tibialis Recruitment: normal Transverse Arch:   Splay Toe present: No  Hammer Toes present: early Bunion present: early Bunionette present: No  Morton's Callus present: Yes  Palpation:   Navicular: nontender Base of the 5th metatarsal: nontender Posterior aspect of the MEDIAL Malleolus: nontender Posterior aspect of the LATERAL Malleolus: nontender  ROM: Normal, no significant limitations Strength: No significant weakness with Inversion, eversion, dorsiflexion and plantar flexion Ankle Stablity: stable to testing and No significant pain with midfoot abduction  Moderate midfoot bossing.  No focal TTP  ASSESSMENT   1. Metatarsalgia of right foot   2.  Loss of transverse plantar arch of right foot   3. Strain of right tibialis anterior muscle, sequela     PLAN:  Pertinent additional documentation may be included in corresponding procedure notes, imaging studies, problem based documentation and patient instructions.  Procedures:  . None  Medications:  No orders of the defined types were placed in this encounter.  Discussion/Instructions: No problem-specific Assessment & Plan notes found for this encounter.  . Moderate midfoot breakdown. Anterior tib tendon is slightly irregular but inact.  Good home padding construction mickicking a longitudinal/transverse arch support. Continue padding. . Discussed red flag symptoms that warrant earlier emergent evaluation and patient voices understanding. . Activity modifications and the importance of avoiding exacerbating activities (limiting pain to no more than a 4 / 10 during or following activity) recommended and discussed. . >50% of this 30 minutes minute visit spent in direct patient counseling and/or coordination of care. Discussion was focused on education regarding the in discussing the pathoetiology and anticipated clinical course of the above condition.  Follow-up:  . Return if symptoms worsen or fail to improve, for as needed for ongoing issues.   . If any lack of improvement consider: further diagnostic evaluation with plain film X-rays of the foot and/or MSK US of the foot/ankle - Consider custom insoles if persistent symptoms      CMA/ATC served as scribe during this visit. History, Physical, and Plan performed by medical provider. Documentation and orders reviewed and attested to.      Gerda Diss, [place] [facility] Physician

## 2017-10-30 NOTE — Patient Instructions (Signed)
It was great to meet you.  Look [email or web address] for insole options  We can also do custom cushion insoles for you if you are continuing to have issues.

## 2017-11-07 DIAGNOSIS — H43813 Vitreous degeneration, bilateral: Secondary | ICD-10-CM | POA: Diagnosis not present

## 2017-11-07 DIAGNOSIS — H2513 Age-related nuclear cataract, bilateral: Secondary | ICD-10-CM | POA: Diagnosis not present

## 2017-12-29 ENCOUNTER — Encounter: Payer: Self-pay | Admitting: Family Medicine

## 2017-12-29 ENCOUNTER — Ambulatory Visit: Payer: PPO | Admitting: Cardiovascular Disease

## 2017-12-29 ENCOUNTER — Encounter: Payer: Self-pay | Admitting: Cardiovascular Disease

## 2017-12-29 VITALS — BP 138/84 | HR 72 | Ht 68.5 in | Wt 174.4 lb

## 2017-12-29 DIAGNOSIS — I10 Essential (primary) hypertension: Secondary | ICD-10-CM | POA: Diagnosis not present

## 2017-12-29 DIAGNOSIS — E7800 Pure hypercholesterolemia, unspecified: Secondary | ICD-10-CM

## 2017-12-29 DIAGNOSIS — R011 Cardiac murmur, unspecified: Secondary | ICD-10-CM | POA: Diagnosis not present

## 2017-12-29 DIAGNOSIS — I491 Atrial premature depolarization: Secondary | ICD-10-CM | POA: Diagnosis not present

## 2017-12-29 NOTE — Progress Notes (Signed)
Cardiology Office Note:    Date:  01/03/2018   ID:  Darius Fuller Keigan Tafoya., DOB November 08, 1945, MRN [id number]  PCP:  [name], MD  Cardiologist:  No primary care provider on file.  Electrophysiologist:  None   Referring MD: [name], MD   Chief Complaint  Patient presents with  . New Patient (Initial Visit)    referred by Dr [name] for heart murmur. noted ~10 years ago. moved from [place] this May.  Darius Long. is a 72 y.o. male who is being seen today for the evaluation of murmur at the request of [name], MD.   History of Present Illness:    Darius Trier. is a 72 y.o. male with a hx of a cardiac murmur that has been followed by his cardiologist in [place] for the last 10 years (Dr. Ashley Mariner, [facility]).  He reports having yearly echoes and a stress test every other year for this.  Additional problems include hypercholesterolemia, mild hyperglycemia, systemic hypertension, treated hypothyroidism.  Unsure about the results of his cardiac work-up and the cause of the murmur.  He is a retired Civil engineer, contracting.  The patient specifically denies any chest pain at rest exertion, dyspnea at rest or with exertion, orthopnea, paroxysmal nocturnal dyspnea, syncope, palpitations, focal neurological deficits, intermittent claudication, lower extremity edema, unexplained weight gain, cough, hemoptysis or wheezing.  He relates his murmur to a severe fall off a balcony that occurred about 10 years ago.  The murmur was detected shortly after that.  He wonders whether trauma could have caused it.  Additional medical problems include adult ADD, prediabetes, hyperlipidemia on statin therapy but he does not have a history of CAD or peripheral vascular disease, stroke, arrhythmia, syncope.  He had laser vein surgery for varicose veins of his lower extremities in 2009.  He did not have evidence of carotid stenosis on duplex ultrasound performed in  2016.  His most recent labs show a hemoglobin A1c of 5.9%, total cholesterol 139, HDL 44, LDL 84, triglycerides 53 (April 08, 2017).  He has treated hypothyroidism and the dose of thyroid supplement was recently adjusted.    He has had a variety of musculoskeletal problems and is seeing a sports medicine specialist.  He has had arthroscopic surgery at least twice in his left knee.  He had multiple fractured vertebrae and a fracture of the left scapula after his fall 10 years ago  Past Medical History:  Diagnosis Date  . Arthritis   . Attention deficit disorder (ADD) in adult    Adderall 30mg  tablet- half tablet twice a day.   . Chicken pox   . Colon polyp    dad died of colon cancer- q5 year colonoscopy. 2018  . Encounter for blood transfusion    related to overtreatment with PCN. became very ill.   [name] Kitchen Heart murmur    Sees cardiology October each year due to murmur after fall- get records. Seen for heart murmur heard 2010. We discused may not need to see cardiology. Every other year stres test  . Hyperglycemia    a1c 5.9 in 03/2017  . Hyperlipidemia    atorvastatin 40mg   . Hypertension    losartan 50, hctz 12.5mg   . Hypothyroidism    levothyroxine 100 mcg 6 days a week as of mid 2019.   . Seasonal allergies    prn antihistamine    Past Surgical History:  Procedure Laterality Date  . Arthroscopic surgery Left  [id number]   Damaged cartilage Left knee  . BACK SURGERY Bilateral 2008   Injury due to a fall. 2 fractured vertebrae, 3 fractured ribs fracture L scapulla, mild concussion  . CHOLECYSTECTOMY  2014  . Relocation surgery Right    For right elbow ulna nerve   . REPAIR of torn retina Right 2014  . TONSILECTOMY, ADENOIDECTOMY, BILATERAL MYRINGOTOMY AND TUBES  1958  . vena Laser   2009   Remaoval of varicous veins    Current Medications: Current Meds  Medication Sig  . amphetamine-dextroamphetamine (ADDERALL) 30 MG tablet Take 0.5 tablets by mouth 2 (two) times daily.  [name] Kitchen  aspirin EC 81 MG tablet Take 81 mg by mouth daily.  [name] Kitchen atorvastatin (LIPITOR) 40 MG tablet Take 1 tablet (40 mg total) by mouth daily.  . calcium carbonate (TUMS - DOSED IN MG ELEMENTAL CALCIUM) 500 MG chewable tablet Chew 1 tablet by mouth daily.  . CHONDROITIN SULFATE PO Take 200 mg by mouth daily.  . DHA-EPA-GLA PO Take 690 mg by mouth daily.   . diclofenac sodium (VOLTAREN) 1 % GEL Apply topically at bedtime.  . Glucosamine HCl 1500 MG TABS Take 1,500 mg by mouth daily.  [name] Kitchen Hyaluronic Acid-Vitamin C (HYALURONIC ACID PO) Take 3.3 mg by mouth daily.  . hydrochlorothiazide (HYDRODIURIL) 12.5 MG tablet Take 1 tablet (12.5 mg total) by mouth daily.  . Investigational vitamin D 600 UNITS capsule SWOG B3419 Take 625 Units by mouth daily. Take with food.  [name] Kitchen levothyroxine (SYNTHROID, LEVOTHROID) 75 MCG tablet Take 1 tablet (75 mcg total) by mouth daily.  [name] Kitchen losartan (COZAAR) 50 MG tablet Take 1 tablet (50 mg total) by mouth daily.  . Magnesium Gluconate (MAGNESIUM 27) 500 (27 Mg) MG TABS Take 20 mg by mouth daily.  . Misc Natural Products (TART CHERRY ADVANCED PO) Take 800 mg by mouth daily.  . Probiotic Product (PROBIOTIC ADVANCED PO) Take 1 tablet by mouth daily.  . vitamin E 400 UNIT capsule Take 800 Units by mouth daily.     Allergies:   Patient has no known allergies.   Social History   Socioeconomic History  . Marital status: Married    Spouse name: Not on file  . Number of children: Not on file  . Years of education: Not on file  . Highest education level: Not on file  Occupational History  . Occupation: Retired   Scientific laboratory technician  . Financial resource strain: Not on file  . Food insecurity:    Worry: Not on file    Inability: Not on file  . Transportation needs:    Medical: Not on file    Non-medical: Not on file  Tobacco Use  . Smoking status: Never Smoker  . Smokeless tobacco: Never Used  Substance and Sexual Activity  . Alcohol use: Not Currently  . Drug use: Never  .  Sexual activity: Not Currently  Lifestyle  . Physical activity:    Days per week: Not on file    Minutes per session: Not on file  . Stress: Not on file  Relationships  . Social connections:    Talks on phone: Not on file    Gets together: Not on file    Attends religious service: Not on file    Active member of club or organization: Not on file    Attends meetings of clubs or organizations: Not on file    Relationship status: Not on file  Other Topics Concern  . Not on file  Social  History Narrative   Married. Twin daughters. 4 grandkids (2 each)   Moved from [place], [place]      Retired from Conservation officer, historic buildings      Hobbies: time with grandkids, enjoys travel     Family History: The patient's family history includes Arthritis in his mother; Colon cancer in his father; Diabetes Mellitus I in his daughter; Hearing loss in his father; Heart disease in his paternal grandmother; Miscarriages / [place] in his daughter and mother; Other in his daughter.  ROS:   Please see the history of present illness.     All other systems reviewed and are negative.  EKGs/Labs/Other Studies Reviewed:    The following studies were reviewed today: Notes from his primary care provider from [place], labs.  His echocardiograms and stress test are not available for review, no notes available from his cardiologist in [place].  EKG:  EKG is  ordered today.  The ekg ordered today demonstrates sinus rhythm with atrial bigeminy and rightward axis, otherwise normal tracing, QTC 438 ms  Recent Labs: 10/23/2017: ALT 39; BUN 17; Creatinine, Ser 1.00; Hemoglobin 15.1; Platelets 259.0; Potassium 3.9; Sodium 137; TSH 0.91  Recent Lipid Panel    Component Value Date/Time   CHOL 131 10/23/2017 0858   TRIG 79.0 10/23/2017 0858   HDL 42.90 10/23/2017 0858   CHOLHDL 3 10/23/2017 0858   VLDL 15.8 10/23/2017 0858   LDLCALC 73 10/23/2017 0858    Physical Exam:    VS:  BP 138/84    Pulse 72   Ht 5' 8.5" (1.74 m)   Wt 174 lb 6.4 oz (79.1 kg)   BMI 26.13 kg/m     Wt Readings from Last 3 Encounters:  01/01/18 174 lb 3.2 oz (79 kg)  12/29/17 174 lb 6.4 oz (79.1 kg)  10/30/17 178 lb (80.7 kg)     GEN: Appears fit and younger than stated age, well nourished, well developed in no acute distress HEENT: Normal NECK: No JVD; No carotid bruits LYMPHATICS: No lymphadenopathy CARDIAC: Bigeminal rhythm alternating with regular rhythm, early peaking 2/6 aortic ejection murmur radiating towards the carotids but not quite reaching the neck, no diastolic murmurs, rubs, gallops RESPIRATORY:  Clear to auscultation without rales, wheezing or rhonchi  ABDOMEN: Soft, non-tender, non-distended MUSCULOSKELETAL:  No edema; No deformity  SKIN: Warm and dry NEUROLOGIC:  Alert and oriented x 3 PSYCHIATRIC:  Normal affect   ASSESSMENT:    1. Cardiac murmur   2. Hypercholesterolemia   3. Premature atrial contractions   4. Essential hypertension    PLAN:    In order of problems listed above:  1. Murmur: Compatible with aortic sclerosis of mild aortic stenosis (presume the latter since he has had such frequent echoes).  He reports it has been a year since his last study so we will reevaluate with another echocardiogram here. 2. HLP: All lipid parameters are at target range for a patient without known vascular disease. 3. PACs: Asymptomatic, they do not require specific therapy or further investigation at this time. 4. HTN: Slightly above target of 130/80.  At his appointment with Dr. Yong Channel his blood pressure was close to the target range at 126/82.   Medication Adjustments/Labs and Tests Ordered: Current medicines are reviewed at length with the patient today.  Concerns regarding medicines are outlined above.  Orders Placed This Encounter  Procedures  . EKG 12-Lead  . ECHOCARDIOGRAM COMPLETE   No orders of the defined types were placed in this  encounter.   Patient  Instructions  Medication Instructions:  Dr Sallyanne Kuster recommends that you continue on your current medications as directed. Please refer to the Current Medication list given to you today.  If you need a refill on your cardiac medications before your next appointment, please call your pharmacy.   Testing/Procedures: Your physician has requested that you have an echocardiogram. Echocardiography is a painless test that uses sound waves to create images of your heart. It provides your doctor with information about the size and shape of your heart and how well your heart's chambers and valves are working. This procedure takes approximately one hour. There are no restrictions for this procedure.  >> This will be performed at our [facility] location [place], [place] [address] [phone]  Follow-Up: Dr Sallyanne Kuster recommends that you follow-up with him as needed.    Signed, Sanda Klein, MD  01/03/2018 12:44 PM    [place]

## 2017-12-29 NOTE — Telephone Encounter (Signed)
See note  Copied from [place] [phone]. Topic: General - Other >> Dec 29, 2017  1:07 PM Cecelia Byars, NT wrote: Reason for CRM: Marcie Bal from [facility] called and would like a return call the regarding the patient . He states Dr Yong Channel has received his records from his previous provider . Please call her at   , [phone]

## 2017-12-29 NOTE — Patient Instructions (Signed)
Medication Instructions:  Dr Sallyanne Kuster recommends that you continue on your current medications as directed. Please refer to the Current Medication list given to you today.  If you need a refill on your cardiac medications before your next appointment, please call your pharmacy.   Testing/Procedures: Your physician has requested that you have an echocardiogram. Echocardiography is a painless test that uses sound waves to create images of your heart. It provides your doctor with information about the size and shape of your heart and how well your heart's chambers and valves are working. This procedure takes approximately one hour. There are no restrictions for this procedure.  >> This will be performed at our [facility] location [place], [place] [address] [phone]  Follow-Up: Dr Sallyanne Kuster recommends that you follow-up with him as needed.

## 2017-12-30 NOTE — Telephone Encounter (Signed)
Called and spoke with patient and explained that HIM nor Dr Yong Channel have his records. I suggested that he go to [facility] to sign a new Records Release in order for his new cardiologist to get these records. Patient stated understanding.

## 2017-12-30 NOTE — Telephone Encounter (Signed)
Called and spoke with Marcie Bal at [facility]. Neither her or I see records in media tab that have been scanned in. Patient told Marcie Bal that Dr Yong Channel had his records in his hand at this office visit. Will send to Dr Yong Channel to see if he has records.

## 2018-01-01 ENCOUNTER — Encounter: Payer: Self-pay | Admitting: Family Medicine

## 2018-01-01 ENCOUNTER — Ambulatory Visit (INDEPENDENT_AMBULATORY_CARE_PROVIDER_SITE_OTHER): Payer: PPO | Admitting: Family Medicine

## 2018-01-01 VITALS — BP 136/78 | HR 66 | Temp 97.9°F | Ht 68.5 in | Wt 174.2 lb

## 2018-01-01 DIAGNOSIS — I10 Essential (primary) hypertension: Secondary | ICD-10-CM | POA: Diagnosis not present

## 2018-01-01 DIAGNOSIS — F429 Obsessive-compulsive disorder, unspecified: Secondary | ICD-10-CM | POA: Diagnosis not present

## 2018-01-01 MED ORDER — ESCITALOPRAM OXALATE 10 MG PO TABS: 10.0000 mg | ORAL_TABLET | Freq: Every day | ORAL | 2 refills | Status: DC

## 2018-01-01 NOTE — Patient Instructions (Addendum)
I want you to start Lexapro 5 mg-I sent in 10 mg tablets but I want you to break these in half if you can.  Lets reevaluate in 4 to 6 weeks.  If you feel like you are doing really well at 2 weeks without increased anxiety and want to increase to 10 mg then you can  Blood pressure looks okay-continue current medications for now-hopefully will improve with improvements in anxiety  Also want you to try to start regular exercise even if just 10 minutes 5 days a week with goal of 30 minutes 5 days a week  Consider counseling with either [facility] or someone else in the area you feel comfortable with-would love if you have been established by next visit  Taking the medicine as directed and not missing any doses is one of the best things you can do to treat your depression/anxiety/OCD.  Here are some things to keep in mind:  1) Side effects (stomach upset, some increased anxiety) may happen before you notice a benefit.  These side effects typically go away over time. 2) Changes to your dose of medicine or a change in medication all together is sometimes necessary 3) Most people need to be on medication at least 6-12 months 4) Many people will notice an improvement within two weeks but the full effect of the medication can take up to 4-6 weeks 5) Stopping the medication when you start feeling better often results in a return of symptoms 6) If you start having thoughts of hurting yourself or others after starting this medicine, call our office immediately at [phone] or seek care through 911.

## 2018-01-01 NOTE — Assessment & Plan Note (Signed)
S: controlled on losartan 50 mg, hydrochlorothiazide 12.5 mg.  Patient is on ADD medications which can raise blood pressure. Goal last visit was to add exercise- but he has only done this minimally.   Home #s in 130s or low 161W and diastolics in the 96E.  BP Readings from Last 3 Encounters:  01/01/18 136/78  12/29/17 138/84  10/30/17 140/84  A/P: We discussed blood pressure goal of <140/90. Continue current meds: Wants to work on anxiety to see if improved levels will lower blood pressure further

## 2018-01-01 NOTE — Progress Notes (Signed)
Subjective:  Darius Long. is a 72 y.o. year old very pleasant male patient who presents for/with See problem oriented charting ROS-admits to anxiety.  Denies chest pain or shortness of breath.  No fever or chills reported.  No edema.  Past Medical History-  Patient Active Problem List   Diagnosis Date Noted  . Attention deficit disorder (ADD) in adult     Priority: High  . Heart murmur     Priority: High  . Arthritis 07/07/2017    Priority: Medium  . Hypertension     Priority: Medium  . Hyperlipidemia     Priority: Medium  . Hypothyroidism     Priority: Medium  . Hyperglycemia     Priority: Medium  . Seasonal allergies     Priority: Low  . Colon polyp     Priority: Low  . OCD (obsessive compulsive disorder) 01/01/2018  . Strain of right tibialis anterior muscle 10/30/2017    Medications- reviewed and updated Current Outpatient Medications  Medication Sig Dispense Refill  . aspirin EC 81 MG tablet Take 81 mg by mouth daily.    [name] Kitchen atorvastatin (LIPITOR) 40 MG tablet Take 1 tablet (40 mg total) by mouth daily. 90 tablet 3  . calcium carbonate (TUMS - DOSED IN MG ELEMENTAL CALCIUM) 500 MG chewable tablet Chew 1 tablet by mouth daily.    . calcium citrate-vitamin D (CITRACAL+D) 315-200 MG-UNIT tablet Take 1 tablet by mouth 2 (two) times daily.    . CHONDROITIN SULFATE PO Take 200 mg by mouth daily.    . DHA-EPA-GLA PO Take 690 mg by mouth daily.     . diclofenac sodium (VOLTAREN) 1 % GEL Apply topically at bedtime.    . Glucosamine HCl 1500 MG TABS Take 1,500 mg by mouth daily.    [name] Kitchen Hyaluronic Acid-Vitamin C (HYALURONIC ACID PO) Take 3.3 mg by mouth daily.    . hydrochlorothiazide (HYDRODIURIL) 12.5 MG tablet Take 1 tablet (12.5 mg total) by mouth daily. 90 tablet 3  . Investigational vitamin D 600 UNITS capsule SWOG S0812 Take 625 Units by mouth daily. Take with food.    [name] Kitchen levothyroxine (SYNTHROID, LEVOTHROID) 75 MCG tablet Take 1 tablet (75 mcg total) by mouth daily.  90 tablet 3  . losartan (COZAAR) 50 MG tablet Take 1 tablet (50 mg total) by mouth daily. 90 tablet 3  . Magnesium Gluconate (MAGNESIUM 27) 500 (27 Mg) MG TABS Take 20 mg by mouth daily.    . Misc Natural Products (TART CHERRY ADVANCED PO) Take 800 mg by mouth daily.    . Probiotic Product (PROBIOTIC ADVANCED PO) Take 1 tablet by mouth daily.    . vitamin E 400 UNIT capsule Take 800 Units by mouth daily.    [name] Kitchen amphetamine-dextroamphetamine (ADDERALL) 30 MG tablet Take 0.5 tablets by mouth 2 (two) times daily. 30 tablet 0  . escitalopram (LEXAPRO) 10 MG tablet Take 1 tablet (10 mg total) by mouth daily. 30 tablet 2   No current facility-administered medications for this visit.     Objective: BP 136/78 (BP Location: Left Arm, Patient Position: Sitting, Cuff Size: Large)   Pulse 66   Temp 97.9 F (36.6 C) (Oral)   Ht 5' 8.5" (1.74 m)   Wt 174 lb 3.2 oz (79 kg)   SpO2 95%   BMI 26.10 kg/m  Gen: NAD, resting comfortably CV: RRR no murmurs rubs or gallops Lungs: CTAB no crackles, wheeze, rhonchi Abdomen: soft/nontender/nondistended/normal bowel sounds. No rebound or guarding.  Ext:  no edema Skin: warm, dry Neuro: Speech normal, moves all extremities Appears somewhat anxious  Assessment/Plan:  Hypertension S: controlled on losartan 50 mg, hydrochlorothiazide 12.5 mg.  Patient is on ADD medications which can raise blood pressure. Goal last visit was to add exercise- but he has only done this minimally.   Home #s in 130s or low 993Z and diastolics in the 16R.  BP Readings from Last 3 Encounters:  01/01/18 136/78  12/29/17 138/84  10/30/17 140/84  A/P: We discussed blood pressure goal of <140/90. Continue current meds: Wants to work on anxiety to see if improved levels will lower blood pressure further  OCD (obsessive compulsive disorder) S: patient admits to high stress in general. He has not felt like he can deal with stress in productive manner for last 1-2 years if not longer per  wife. In the last 4 months he feels it has been worse and feels like blood pressure has been higher.  Apparently had some anger issues in past. Also treated for ocd/agitation in past with lexapro and helpful- had a stressful event around 2012. Takes it out on his wife when he gets frustrated. Seems to get overfocused on events. Had tried other meds but lexapro most helpful. Does worsen his ED  Counseling in [place] helpful to certain extent.  A/P: Patient reports prior diagnosis of OCD- appears to have poor control right now.  Also appears to have some anxiety-with GAD-7 of 7 and I suspect underreported as well as mild depressed mood with PHQ 9 of 4.  -Add counseling - Start Lexapro 5 mg-see AVS with intention to go to 10 mg - 4 to 6-week follow-up - Add exercise -Discussed possibly reducing Adderall to help with anxiety and blood pressure   Future Appointments  Date Time Provider [place]  01/02/2018  9:30 AM [facility] ECHO 5 [id number]  05/15/2018  8:00 AM Hayden Pedro, MD [facility] None   Meds ordered this encounter  Medications  . escitalopram (LEXAPRO) 10 MG tablet    Sig: Take 1 tablet (10 mg total) by mouth daily.    Dispense:  30 tablet    Refill:  2   Return precautions advised.  [name], MD

## 2018-01-01 NOTE — Assessment & Plan Note (Addendum)
S: patient admits to high stress in general. He has not felt like he can deal with stress in productive manner for last 1-2 years if not longer per wife. In the last 4 months he feels it has been worse and feels like blood pressure has been higher.  Apparently had some anger issues in past. Also treated for ocd/agitation in past with lexapro and helpful- had a stressful event around 2012. Takes it out on his wife when he gets frustrated. Seems to get overfocused on events. Had tried other meds but lexapro most helpful. Does worsen his ED  Counseling in [place] helpful to certain extent.  A/P: Patient reports prior diagnosis of OCD- appears to have poor control right now.  Also appears to have some anxiety-with GAD-7 of 7 and I suspect underreported as well as mild depressed mood with PHQ 9 of 4.  -Add counseling - Start Lexapro 5 mg-see AVS with intention to go to 10 mg - 4 to 6-week follow-up - Add exercise -Discussed possibly reducing Adderall to help with anxiety and blood pressure

## 2018-01-02 ENCOUNTER — Other Ambulatory Visit: Payer: Self-pay

## 2018-01-02 ENCOUNTER — Ambulatory Visit (HOSPITAL_COMMUNITY): Payer: PPO | Attending: Cardiology

## 2018-01-02 DIAGNOSIS — R011 Cardiac murmur, unspecified: Secondary | ICD-10-CM | POA: Diagnosis not present

## 2018-01-12 ENCOUNTER — Encounter: Payer: Self-pay | Admitting: Family Medicine

## 2018-01-15 MED ORDER — AMPHETAMINE-DEXTROAMPHETAMINE 30 MG PO TABS: 15.0000 mg | ORAL_TABLET | Freq: Two times a day (BID) | ORAL | 0 refills | Status: DC

## 2018-02-05 ENCOUNTER — Encounter: Payer: Self-pay | Admitting: Family Medicine

## 2018-02-05 ENCOUNTER — Ambulatory Visit (INDEPENDENT_AMBULATORY_CARE_PROVIDER_SITE_OTHER): Payer: PPO | Admitting: Family Medicine

## 2018-02-05 VITALS — BP 138/72 | HR 82 | Temp 98.3°F | Ht 68.5 in | Wt 175.0 lb

## 2018-02-05 DIAGNOSIS — I10 Essential (primary) hypertension: Secondary | ICD-10-CM | POA: Diagnosis not present

## 2018-02-05 DIAGNOSIS — F988 Other specified behavioral and emotional disorders with onset usually occurring in childhood and adolescence: Secondary | ICD-10-CM

## 2018-02-05 DIAGNOSIS — F429 Obsessive-compulsive disorder, unspecified: Secondary | ICD-10-CM | POA: Diagnosis not present

## 2018-02-05 NOTE — Assessment & Plan Note (Signed)
S: Patient seems to be doing well on Adderall 15 mg twice a day.  He states being able to focus helps him with his anxiety A/P: Stable-continue current medications.  Watching blood pressure carefully-see hypertension section.  Would prefer to reduce ADD meds instead of add blood pressure medication if needed

## 2018-02-05 NOTE — Progress Notes (Signed)
Subjective:  Darius Long. is a 73 y.o. year old very pleasant male patient who presents for/with See problem oriented charting ROS-anxiety improved, no edema.  No suicidal ideation.  No chest pain reported.  Past Medical History-  Patient Active Problem List   Diagnosis Date Noted  . Attention deficit disorder (ADD) in adult     Priority: High  . Heart murmur     Priority: High  . Arthritis 07/07/2017    Priority: Medium  . Hypertension     Priority: Medium  . Hyperlipidemia     Priority: Medium  . Hypothyroidism     Priority: Medium  . Hyperglycemia     Priority: Medium  . Seasonal allergies     Priority: Low  . Colon polyp     Priority: Low  . OCD (obsessive compulsive disorder) 01/01/2018  . Strain of right tibialis anterior muscle 10/30/2017    Medications- reviewed and updated Current Outpatient Medications  Medication Sig Dispense Refill  . amphetamine-dextroamphetamine (ADDERALL) 30 MG tablet Take 0.5 tablets by mouth 2 (two) times daily. 90 tablet 0  . aspirin EC 81 MG tablet Take 81 mg by mouth daily.    [name] Kitchen atorvastatin (LIPITOR) 40 MG tablet Take 1 tablet (40 mg total) by mouth daily. 90 tablet 3  . calcium carbonate (TUMS - DOSED IN MG ELEMENTAL CALCIUM) 500 MG chewable tablet Chew 1 tablet by mouth daily.    . calcium citrate-vitamin D (CITRACAL+D) 315-200 MG-UNIT tablet Take 1 tablet by mouth 2 (two) times daily.    . CHONDROITIN SULFATE PO Take 200 mg by mouth daily.    . DHA-EPA-GLA PO Take 690 mg by mouth daily.     . diclofenac sodium (VOLTAREN) 1 % GEL Apply topically at bedtime.    [name] Kitchen escitalopram (LEXAPRO) 10 MG tablet Take 1 tablet (10 mg total) by mouth daily. 30 tablet 2  . Glucosamine HCl 1500 MG TABS Take 1,500 mg by mouth daily.    [name] Kitchen Hyaluronic Acid-Vitamin C (HYALURONIC ACID PO) Take 3.3 mg by mouth daily.    . hydrochlorothiazide (HYDRODIURIL) 12.5 MG tablet Take 1 tablet (12.5 mg total) by mouth daily. 90 tablet 3  . Investigational  vitamin D 600 UNITS capsule SWOG S0812 Take 625 Units by mouth daily. Take with food.    [name] Kitchen levothyroxine (SYNTHROID, LEVOTHROID) 75 MCG tablet Take 1 tablet (75 mcg total) by mouth daily. 90 tablet 3  . losartan (COZAAR) 50 MG tablet Take 1 tablet (50 mg total) by mouth daily. 90 tablet 3  . Misc Natural Products (TART CHERRY ADVANCED PO) Take 800 mg by mouth daily.    . Probiotic Product (PROBIOTIC ADVANCED PO) Take 1 tablet by mouth daily.    . vitamin E 400 UNIT capsule Take 800 Units by mouth daily.     No current facility-administered medications for this visit.     Objective: BP 138/72 (BP Location: Left Arm, Patient Position: Sitting, Cuff Size: Large)   Pulse 82   Temp 98.3 F (36.8 C) (Oral)   Ht 5' 8.5" (1.74 m)   Wt 175 lb (79.4 kg)   SpO2 97%   BMI 26.22 kg/m  Gen: NAD, resting comfortably CV: RRR no murmurs rubs or gallops Lungs: CTAB no crackles, wheeze, rhonchi Abdomen: soft/nontender/nondistended Ext: no edema Skin: warm, dry  Assessment/Plan:  Hypertension S: controlled on  losartan 50mg , hcz 12.5mg . monitoring closely due to ADD meds- home #s usaully under 140 after some rest (only time that didn't  happen is when he held diuretic for a few days because he didn't have as easy access to the bathroom BP Readings from Last 3 Encounters:  02/05/18 138/72  01/01/18 136/78  12/29/17 138/84  A/P: We discussed blood pressure goal of <140/90. Continue current meds: He asks about increasing medication-I told him lets focus on regular exercise and healthy eating.  I would more strongly consider stopping Adderall and adding more blood pressure medicine- he is worried if he stops adderall that it will increase his anxiety  OCD (obsessive compulsive disorder) S:  Patient has noted a moderate tempering of his anxiety since last visit on 10mg  of lexapro (started at 5 mg).  Anxiety likely related OCD.  PHQ 9 not elevated.  No suicidal ideation - set up 3 appointments with Trey Paula in February - tried to be more active but his objective was to get to [facility] twice a week and walk if couldn't do that- he is discouraged as doesn't feel he has made much progress. Wife has orthopedic issue he has had to help her with - he is going to try not to schedule things in the morning to give him time for exercise.  A/P: Appears slightly improved- we are going to give this some more time in addition to adding counseling.  We opted to follow-up in 2 months to reassess after his visits with Trey Paula of [place] [facility]  Attention deficit disorder (ADD) in adult S: Patient seems to be doing well on Adderall 15 mg twice a day.  He states being able to focus helps him with his anxiety A/P: Stable-continue current medications.  Watching blood pressure carefully-see hypertension section.  Would prefer to reduce ADD meds instead of add blood pressure medication if needed   Future Appointments  Date Time Provider Tool  02/23/2018  2:00 PM Shelor Sherrilyn Rist, [place] [facility] None  03/02/2018 11:00 AM Shelor Sherrilyn Rist, [place] [facility] None  03/09/2018  2:00 PM Shelor Sherrilyn Rist, [place] [facility] None  03/23/2018  1:40 PM [name], MD [facility]  05/15/2018  8:00 AM Hayden Pedro, MD [facility] None   Return precautions advised.  [name], MD

## 2018-02-05 NOTE — Patient Instructions (Addendum)
Glad blood pressure is staying <140/90 overall- thanks for the great job on home monitoring  Seems like lexapro is helping anxiety some. Lets see how things go with Lattie Haw and then check back in 2-3 months from today

## 2018-02-05 NOTE — Assessment & Plan Note (Signed)
S:  Patient has noted a moderate tempering of his anxiety since last visit on 10mg  of lexapro (started at 5 mg).  Anxiety likely related OCD.  PHQ 9 not elevated.  No suicidal ideation - set up 3 appointments with Trey Paula in February - tried to be more active but his objective was to get to [facility] twice a week and walk if couldn't do that- he is discouraged as doesn't feel he has made much progress. Wife has orthopedic issue he has had to help her with - he is going to try not to schedule things in the morning to give him time for exercise.  A/P: Appears slightly improved- we are going to give this some more time in addition to adding counseling.  We opted to follow-up in 2 months to reassess after his visits with Trey Paula of [place] [facility]

## 2018-02-05 NOTE — Assessment & Plan Note (Signed)
S: controlled on  losartan 50mg , hcz 12.5mg . monitoring closely due to ADD meds- home #s usaully under 140 after some rest (only time that didn't happen is when he held diuretic for a few days because he didn't have as easy access to the bathroom BP Readings from Last 3 Encounters:  02/05/18 138/72  01/01/18 136/78  12/29/17 138/84  A/P: We discussed blood pressure goal of <140/90. Continue current meds: He asks about increasing medication-I told him lets focus on regular exercise and healthy eating.  I would more strongly consider stopping Adderall and adding more blood pressure medicine- he is worried if he stops adderall that it will increase his anxiety

## 2018-02-23 ENCOUNTER — Ambulatory Visit (INDEPENDENT_AMBULATORY_CARE_PROVIDER_SITE_OTHER): Payer: PPO | Admitting: Psychology

## 2018-02-23 DIAGNOSIS — F411 Generalized anxiety disorder: Secondary | ICD-10-CM

## 2018-03-02 ENCOUNTER — Ambulatory Visit: Payer: PPO | Admitting: Psychology

## 2018-03-02 DIAGNOSIS — F411 Generalized anxiety disorder: Secondary | ICD-10-CM

## 2018-03-09 ENCOUNTER — Ambulatory Visit: Payer: PPO | Admitting: Psychology

## 2018-03-09 DIAGNOSIS — F411 Generalized anxiety disorder: Secondary | ICD-10-CM

## 2018-03-23 ENCOUNTER — Encounter: Payer: Self-pay | Admitting: Family Medicine

## 2018-03-23 ENCOUNTER — Ambulatory Visit (INDEPENDENT_AMBULATORY_CARE_PROVIDER_SITE_OTHER): Payer: PPO | Admitting: Family Medicine

## 2018-03-23 VITALS — BP 122/72 | HR 69 | Temp 98.4°F | Ht 68.5 in | Wt 179.0 lb

## 2018-03-23 DIAGNOSIS — E785 Hyperlipidemia, unspecified: Secondary | ICD-10-CM

## 2018-03-23 DIAGNOSIS — I10 Essential (primary) hypertension: Secondary | ICD-10-CM

## 2018-03-23 DIAGNOSIS — E039 Hypothyroidism, unspecified: Secondary | ICD-10-CM | POA: Diagnosis not present

## 2018-03-23 DIAGNOSIS — F429 Obsessive-compulsive disorder, unspecified: Secondary | ICD-10-CM | POA: Diagnosis not present

## 2018-03-23 DIAGNOSIS — R739 Hyperglycemia, unspecified: Secondary | ICD-10-CM | POA: Diagnosis not present

## 2018-03-23 MED ORDER — AMPHETAMINE-DEXTROAMPHETAMINE 30 MG PO TABS: 15.0000 mg | ORAL_TABLET | Freq: Two times a day (BID) | ORAL | 0 refills | Status: DC

## 2018-03-23 MED ORDER — ESCITALOPRAM OXALATE 10 MG PO TABS: 10.0000 mg | ORAL_TABLET | Freq: Every day | ORAL | 3 refills | Status: DC

## 2018-03-23 NOTE — Assessment & Plan Note (Signed)
S: Patient has had several meetings with Mirna Mires of [place] [facility].  He remains on Lexapro 10 mg.  He still has OCD tendencies but improved.  Anxiety related to OCD.  GAD7 down from 13 to 9 today. He feels like snapping with his wife has been better with above treatment. He is enjoying working with Trey Paula.  A/P: improved control- opted to continue lexapro 10mg  and continue follow up with Trey Paula, LCSW as this seems to have been helpful.

## 2018-03-23 NOTE — Assessment & Plan Note (Signed)
S: well controlled on atorvastatin 40mg  late last year A/P: Stable. Continue current medications.

## 2018-03-23 NOTE — Patient Instructions (Addendum)
10/24/2018 or later for physical  You can do your annual wellness visit now but we do not yet have a new nurse/healthcoach hired for these yet.   Schedule a lab visit for sometime in April for 6 month labs  No other changes today

## 2018-03-23 NOTE — Progress Notes (Signed)
Phone [phone]   Subjective:  Darius Ibe. is a 73 y.o. year old very pleasant male patient who presents for/with See problem oriented charting ROS- No chest pain or shortness of breath. No headache or blurry vision. No SI.   Past Medical History-  Patient Active Problem List   Diagnosis Date Noted  . OCD (obsessive compulsive disorder) 01/01/2018    Priority: High  . Attention deficit disorder (ADD) in adult     Priority: High  . Heart murmur     Priority: High  . Arthritis 07/07/2017    Priority: Medium  . Hypertension     Priority: Medium  . Hyperlipidemia     Priority: Medium  . Hypothyroidism     Priority: Medium  . Hyperglycemia     Priority: Medium  . Seasonal allergies     Priority: Low  . Colon polyp     Priority: Low  . Strain of right tibialis anterior muscle 10/30/2017    Medications- reviewed and updated Current Outpatient Medications  Medication Sig Dispense Refill  . aspirin EC 81 MG tablet Take 81 mg by mouth daily.    [name] Kitchen atorvastatin (LIPITOR) 40 MG tablet Take 1 tablet (40 mg total) by mouth daily. 90 tablet 3  . calcium carbonate (TUMS - DOSED IN MG ELEMENTAL CALCIUM) 500 MG chewable tablet Chew 1 tablet by mouth daily.    . CHONDROITIN SULFATE PO Take 200 mg by mouth daily.    . DHA-EPA-GLA PO Take 690 mg by mouth daily.     . diclofenac sodium (VOLTAREN) 1 % GEL Apply topically at bedtime.    [name] Kitchen escitalopram (LEXAPRO) 10 MG tablet Take 1 tablet (10 mg total) by mouth daily. 30 tablet 2  . Glucosamine HCl 1500 MG TABS Take 1,500 mg by mouth daily.    [name] Kitchen Hyaluronic Acid-Vitamin C (HYALURONIC ACID PO) Take 3.3 mg by mouth daily.    . hydrochlorothiazide (HYDRODIURIL) 12.5 MG tablet Take 1 tablet (12.5 mg total) by mouth daily. 90 tablet 3  . Investigational vitamin D 600 UNITS capsule SWOG S0812 Take 625 Units by mouth daily. Take with food.    [name] Kitchen levothyroxine (SYNTHROID, LEVOTHROID) 75 MCG tablet Take 1 tablet (75 mcg total) by mouth  daily. 90 tablet 3  . losartan (COZAAR) 50 MG tablet Take 1 tablet (50 mg total) by mouth daily. 90 tablet 3  . Misc Natural Products (TART CHERRY ADVANCED PO) Take 800 mg by mouth daily.    . Probiotic Product (PROBIOTIC ADVANCED PO) Take 1 tablet by mouth daily.    . vitamin E 400 UNIT capsule Take 800 Units by mouth daily.    [name] Kitchen amphetamine-dextroamphetamine (ADDERALL) 30 MG tablet Take 0.5 tablets by mouth 2 (two) times daily. 90 tablet 0   No current facility-administered medications for this visit.      Objective:  BP 122/72 (BP Location: Left Arm, Patient Position: Sitting, Cuff Size: Large)   Pulse 69   Temp 98.4 F (36.9 C) (Oral)   Ht 5' 8.5" (1.74 m)   Wt 179 lb (81.2 kg)   SpO2 97%   BMI 26.82 kg/m  Gen: NAD, resting comfortably CV: RRR no murmurs rubs or gallops Lungs: CTAB no crackles, wheeze, rhonchi Abdomen: soft/nontender/nondistended/slightly overweight Ext: no edema Skin: warm, dry Neuro: normal gait and speech    Assessment and Plan  #Hypertension S: Compliant with losartan 50 mg, hydrochlorothiazide 12.5 mg (already makes urinating more difficult at times. Home readings with wrist cuff primarily  in 130s and 140s- to me appears average is still under 140. Averaged over 33 and was 138.9  He is going to [facility] 3x a week to try to help A/P: controlled today. Continue current rx.  He is going to try an upper arm cuff instead of wrist cuff on home readings   OCD (obsessive compulsive disorder)  S: Patient has had several meetings with Mirna Mires of [place] [facility].  He remains on Lexapro 10 mg.  He still has OCD tendencies but improved.  Anxiety related to OCD.  GAD7 down from 13 to 9 today. He feels like snapping with his wife has been better with above treatment. He is enjoying working with Trey Paula.  A/P: improved control- opted to continue lexapro 10mg  and continue follow up with Trey Paula, LCSW as this seems to have been helpful.    Hyperlipidemia S: well controlled on atorvastatin 40mg  late last year A/P: Stable. Continue current medications.    Future Appointments  Date Time Provider [place]  04/20/2018  1:00 PM Shelor Sherrilyn Rist, [place] [facility] None  04/27/2018  1:00 PM Shelor Sherrilyn Rist, [place] [facility] None  05/15/2018  8:00 AM Hayden Pedro, MD [facility] None   No follow-ups on file.  Lab/Order associations: Obsessive-compulsive disorder, unspecified type  Hyperlipidemia, unspecified hyperlipidemia type  No orders of the defined types were placed in this encounter.   Return precautions advised.  [name], MD

## 2018-03-25 ENCOUNTER — Encounter: Payer: Self-pay | Admitting: Family Medicine

## 2018-04-19 ENCOUNTER — Encounter: Payer: Self-pay | Admitting: Family Medicine

## 2018-04-20 ENCOUNTER — Ambulatory Visit: Payer: PPO | Admitting: Psychology

## 2018-04-22 ENCOUNTER — Other Ambulatory Visit: Payer: Self-pay

## 2018-04-22 MED ORDER — AMPHETAMINE-DEXTROAMPHETAMINE 30 MG PO TABS: 15.0000 mg | ORAL_TABLET | Freq: Two times a day (BID) | ORAL | 0 refills | Status: DC

## 2018-04-22 NOTE — Telephone Encounter (Signed)
Adderall 30mg   #90/0

## 2018-04-27 ENCOUNTER — Ambulatory Visit: Payer: PPO | Admitting: Psychology

## 2018-05-07 ENCOUNTER — Other Ambulatory Visit: Payer: PPO

## 2018-05-15 ENCOUNTER — Encounter (INDEPENDENT_AMBULATORY_CARE_PROVIDER_SITE_OTHER): Payer: PPO | Admitting: Ophthalmology

## 2018-06-02 ENCOUNTER — Encounter: Payer: Self-pay | Admitting: Family Medicine

## 2018-06-09 ENCOUNTER — Encounter: Payer: Self-pay | Admitting: Family Medicine

## 2018-06-11 ENCOUNTER — Encounter (INDEPENDENT_AMBULATORY_CARE_PROVIDER_SITE_OTHER): Payer: PPO | Admitting: Ophthalmology

## 2018-06-29 ENCOUNTER — Other Ambulatory Visit: Payer: Self-pay

## 2018-06-29 ENCOUNTER — Encounter: Payer: Self-pay | Admitting: Family Medicine

## 2018-06-29 MED ORDER — LEVOTHYROXINE SODIUM 75 MCG PO TABS: 75.0000 ug | ORAL_TABLET | Freq: Every day | ORAL | 3 refills | Status: DC

## 2018-06-29 NOTE — Telephone Encounter (Signed)
You may refill and inform patient- please send to pharmacy he wants- HT was loaded- but looks like he may be requesting [place]

## 2018-06-30 MED ORDER — DICLOFENAC SODIUM 1 % TD GEL: TRANSDERMAL | 1 refills | Status: DC

## 2018-07-01 ENCOUNTER — Other Ambulatory Visit: Payer: Self-pay

## 2018-07-01 ENCOUNTER — Other Ambulatory Visit: Payer: Self-pay | Admitting: Family Medicine

## 2018-07-02 ENCOUNTER — Other Ambulatory Visit: Payer: Self-pay

## 2018-07-02 MED ORDER — DICLOFENAC SODIUM 1 % TD GEL: 2.0000 g | Freq: Every day | TRANSDERMAL | 1 refills | Status: DC

## 2018-07-15 ENCOUNTER — Encounter: Payer: Self-pay | Admitting: Family Medicine

## 2018-07-17 MED ORDER — AMPHETAMINE-DEXTROAMPHETAMINE 30 MG PO TABS: 15.0000 mg | ORAL_TABLET | Freq: Two times a day (BID) | ORAL | 0 refills | Status: DC

## 2018-07-24 ENCOUNTER — Encounter: Payer: Self-pay | Admitting: Family Medicine

## 2018-08-04 ENCOUNTER — Encounter (INDEPENDENT_AMBULATORY_CARE_PROVIDER_SITE_OTHER): Payer: PPO | Admitting: Ophthalmology

## 2018-08-05 ENCOUNTER — Encounter: Payer: Self-pay | Admitting: Family Medicine

## 2018-08-08 ENCOUNTER — Encounter: Payer: Self-pay | Admitting: Family Medicine

## 2018-08-18 ENCOUNTER — Other Ambulatory Visit (INDEPENDENT_AMBULATORY_CARE_PROVIDER_SITE_OTHER): Payer: PPO

## 2018-08-18 ENCOUNTER — Other Ambulatory Visit: Payer: Self-pay

## 2018-08-18 DIAGNOSIS — E785 Hyperlipidemia, unspecified: Secondary | ICD-10-CM | POA: Diagnosis not present

## 2018-08-18 DIAGNOSIS — E039 Hypothyroidism, unspecified: Secondary | ICD-10-CM | POA: Diagnosis not present

## 2018-08-18 DIAGNOSIS — R739 Hyperglycemia, unspecified: Secondary | ICD-10-CM | POA: Diagnosis not present

## 2018-08-18 LAB — COMPREHENSIVE METABOLIC PANEL
ALT: 39 U/L (ref 0–53)
AST: 41 U/L — ABNORMAL HIGH (ref 0–37)
Albumin: 4.2 g/dL (ref 3.5–5.2)
Alkaline Phosphatase: 48 U/L (ref 39–117)
BUN: 21 mg/dL (ref 6–23)
CO2: 34 mEq/L — ABNORMAL HIGH (ref 19–32)
Calcium: 8.9 mg/dL (ref 8.4–10.5)
Chloride: 102 mEq/L (ref 96–112)
Creatinine, Ser: 1.01 mg/dL (ref 0.40–1.50)
GFR: 72.21 mL/min (ref >60.00)
Glucose, Bld: 86 mg/dL (ref 70–99)
Potassium: 4.5 mEq/L (ref 3.5–5.1)
Sodium: 138 mEq/L (ref 135–145)
Total Bilirubin: 1.2 mg/dL (ref 0.2–1.2)
Total Protein: 6.4 g/dL (ref 6.0–8.3)

## 2018-08-18 LAB — LDL CHOLESTEROL, DIRECT: Direct LDL: 89 mg/dL

## 2018-08-18 LAB — TSH: TSH: 2.19 u[IU]/mL (ref 0.35–4.50)

## 2018-08-18 LAB — HEMOGLOBIN A1C: Hgb A1c MFr Bld: 6.3 % (ref 4.6–6.5)

## 2018-08-25 ENCOUNTER — Encounter: Payer: Self-pay | Admitting: Family Medicine

## 2018-08-27 ENCOUNTER — Encounter: Payer: Self-pay | Admitting: Family Medicine

## 2018-08-27 MED ORDER — METFORMIN HCL 500 MG PO TABS: 250.0000 mg | ORAL_TABLET | Freq: Every day | ORAL | 3 refills | Status: DC

## 2018-09-03 ENCOUNTER — Encounter: Payer: Self-pay | Admitting: Family Medicine

## 2018-09-08 ENCOUNTER — Encounter (INDEPENDENT_AMBULATORY_CARE_PROVIDER_SITE_OTHER): Payer: PPO | Admitting: Ophthalmology

## 2018-09-08 ENCOUNTER — Other Ambulatory Visit: Payer: Self-pay

## 2018-09-08 DIAGNOSIS — H43813 Vitreous degeneration, bilateral: Secondary | ICD-10-CM | POA: Diagnosis not present

## 2018-09-08 DIAGNOSIS — H33301 Unspecified retinal break, right eye: Secondary | ICD-10-CM | POA: Diagnosis not present

## 2018-09-08 DIAGNOSIS — I10 Essential (primary) hypertension: Secondary | ICD-10-CM

## 2018-09-08 DIAGNOSIS — H35033 Hypertensive retinopathy, bilateral: Secondary | ICD-10-CM | POA: Diagnosis not present

## 2018-09-08 DIAGNOSIS — H2513 Age-related nuclear cataract, bilateral: Secondary | ICD-10-CM

## 2018-09-21 ENCOUNTER — Encounter: Payer: Self-pay | Admitting: Family Medicine

## 2018-09-21 DIAGNOSIS — E039 Hypothyroidism, unspecified: Secondary | ICD-10-CM

## 2018-09-21 DIAGNOSIS — E785 Hyperlipidemia, unspecified: Secondary | ICD-10-CM

## 2018-09-21 DIAGNOSIS — Z0000 Encounter for general adult medical examination without abnormal findings: Secondary | ICD-10-CM

## 2018-09-21 DIAGNOSIS — I10 Essential (primary) hypertension: Secondary | ICD-10-CM

## 2018-09-21 DIAGNOSIS — Z79899 Other long term (current) drug therapy: Secondary | ICD-10-CM

## 2018-09-21 DIAGNOSIS — R739 Hyperglycemia, unspecified: Secondary | ICD-10-CM

## 2018-09-22 ENCOUNTER — Encounter: Payer: Self-pay | Admitting: Family Medicine

## 2018-09-22 ENCOUNTER — Ambulatory Visit (INDEPENDENT_AMBULATORY_CARE_PROVIDER_SITE_OTHER): Payer: PPO

## 2018-09-22 ENCOUNTER — Other Ambulatory Visit: Payer: Self-pay

## 2018-09-22 DIAGNOSIS — Z23 Encounter for immunization: Secondary | ICD-10-CM | POA: Diagnosis not present

## 2018-09-23 NOTE — Telephone Encounter (Signed)
Called pt, schedule lab visit and placed future lab orders.

## 2018-09-27 ENCOUNTER — Other Ambulatory Visit: Payer: Self-pay | Admitting: Family Medicine

## 2018-10-19 ENCOUNTER — Other Ambulatory Visit: Payer: Self-pay | Admitting: Family Medicine

## 2018-10-19 ENCOUNTER — Encounter: Payer: Self-pay | Admitting: Family Medicine

## 2018-10-19 MED ORDER — ATORVASTATIN CALCIUM 40 MG PO TABS: 40.0000 mg | ORAL_TABLET | Freq: Every day | ORAL | 1 refills | Status: DC

## 2018-10-19 NOTE — Telephone Encounter (Signed)
Last OV 03/23/18 Last refill 07/17/18 #90/0 Next OV 02/02/19  Forwarding to Dr. Yong Channel.

## 2018-10-20 NOTE — Telephone Encounter (Signed)
ADD meds need every 6 month office visit.  He needs to be scheduled for follow-up

## 2018-10-20 NOTE — Telephone Encounter (Signed)
Please schedule pt for 42month f/u per Dr.Hunter.

## 2018-10-21 ENCOUNTER — Encounter: Payer: Self-pay | Admitting: Family Medicine

## 2018-10-22 MED ORDER — AMPHETAMINE-DEXTROAMPHETAMINE 30 MG PO TABS: 15.0000 mg | ORAL_TABLET | Freq: Two times a day (BID) | ORAL | 0 refills | Status: DC

## 2018-10-22 NOTE — Addendum Note (Signed)
Addended by: [name] on: 10/22/2018 01:17 PM   Modules accepted: Orders

## 2018-10-27 ENCOUNTER — Encounter: Payer: PPO | Admitting: Family Medicine

## 2018-10-28 ENCOUNTER — Other Ambulatory Visit: Payer: Self-pay | Admitting: Family Medicine

## 2018-11-10 DIAGNOSIS — H2513 Age-related nuclear cataract, bilateral: Secondary | ICD-10-CM | POA: Diagnosis not present

## 2018-11-10 DIAGNOSIS — H43813 Vitreous degeneration, bilateral: Secondary | ICD-10-CM | POA: Diagnosis not present

## 2018-11-10 DIAGNOSIS — H532 Diplopia: Secondary | ICD-10-CM | POA: Diagnosis not present

## 2018-11-25 ENCOUNTER — Encounter: Payer: Self-pay | Admitting: Family Medicine

## 2018-11-27 ENCOUNTER — Other Ambulatory Visit (INDEPENDENT_AMBULATORY_CARE_PROVIDER_SITE_OTHER): Payer: PPO

## 2018-11-27 ENCOUNTER — Other Ambulatory Visit: Payer: Self-pay

## 2018-11-27 DIAGNOSIS — R739 Hyperglycemia, unspecified: Secondary | ICD-10-CM | POA: Diagnosis not present

## 2018-11-27 LAB — HEMOGLOBIN A1C: Hgb A1c MFr Bld: 5.9 % (ref 4.6–6.5)

## 2018-12-01 ENCOUNTER — Ambulatory Visit (INDEPENDENT_AMBULATORY_CARE_PROVIDER_SITE_OTHER): Payer: PPO

## 2018-12-01 ENCOUNTER — Other Ambulatory Visit: Payer: Self-pay

## 2018-12-01 ENCOUNTER — Ambulatory Visit (INDEPENDENT_AMBULATORY_CARE_PROVIDER_SITE_OTHER): Payer: PPO | Admitting: Family Medicine

## 2018-12-01 ENCOUNTER — Encounter: Payer: Self-pay | Admitting: Family Medicine

## 2018-12-01 VITALS — BP 146/84 | HR 81 | Temp 97.9°F | Ht 68.5 in | Wt 182.8 lb

## 2018-12-01 VITALS — BP 142/74 | Temp 97.9°F | Ht 68.5 in | Wt 182.8 lb

## 2018-12-01 DIAGNOSIS — F429 Obsessive-compulsive disorder, unspecified: Secondary | ICD-10-CM | POA: Diagnosis not present

## 2018-12-01 DIAGNOSIS — E039 Hypothyroidism, unspecified: Secondary | ICD-10-CM | POA: Diagnosis not present

## 2018-12-01 DIAGNOSIS — R011 Cardiac murmur, unspecified: Secondary | ICD-10-CM | POA: Diagnosis not present

## 2018-12-01 DIAGNOSIS — R739 Hyperglycemia, unspecified: Secondary | ICD-10-CM | POA: Diagnosis not present

## 2018-12-01 DIAGNOSIS — F988 Other specified behavioral and emotional disorders with onset usually occurring in childhood and adolescence: Secondary | ICD-10-CM

## 2018-12-01 DIAGNOSIS — E785 Hyperlipidemia, unspecified: Secondary | ICD-10-CM | POA: Diagnosis not present

## 2018-12-01 DIAGNOSIS — I10 Essential (primary) hypertension: Secondary | ICD-10-CM

## 2018-12-01 DIAGNOSIS — Z0000 Encounter for general adult medical examination without abnormal findings: Secondary | ICD-10-CM | POA: Diagnosis not present

## 2018-12-01 NOTE — Progress Notes (Signed)
Phone [phone] In person visit   Subjective:   Darius Long. is a 73 y.o. year old very pleasant male patient who presents for/with See problem oriented charting Chief Complaint  Patient presents with  . Follow-up    ROS- No chest pain or shortness of breath. No headache or blurry vision.    Past Medical History-  Patient Active Problem List   Diagnosis Date Noted  . OCD (obsessive compulsive disorder) 01/01/2018    Priority: High  . Attention deficit disorder (ADD) in adult     Priority: High  . Heart murmur     Priority: High  . Arthritis 07/07/2017    Priority: Medium  . Hypertension     Priority: Medium  . Hyperlipidemia     Priority: Medium  . Hypothyroidism     Priority: Medium  . Hyperglycemia     Priority: Medium  . Seasonal allergies     Priority: Low  . Colon polyp     Priority: Low  . Strain of right tibialis anterior muscle 10/30/2017    Medications- reviewed and updated Current Outpatient Medications  Medication Sig Dispense Refill  . amphetamine-dextroamphetamine (ADDERALL) 30 MG tablet Take 0.5 tablets by mouth 2 (two) times daily. 60 tablet 0  . atorvastatin (LIPITOR) 40 MG tablet Take 1 tablet (40 mg total) by mouth daily. 90 tablet 1  . calcium carbonate (TUMS - DOSED IN MG ELEMENTAL CALCIUM) 500 MG chewable tablet Chew 1 tablet by mouth daily.    . CHONDROITIN SULFATE PO Take 200 mg by mouth daily.    . DHA-EPA-GLA PO Take 690 mg by mouth daily.     . diclofenac sodium (VOLTAREN) 1 % GEL APPLY 2 GRAMS TOPICALLY AT BEDTIME 100 g 2  . escitalopram (LEXAPRO) 10 MG tablet Take 1 tablet (10 mg total) by mouth daily. 90 tablet 3  . Glucosamine HCl 1500 MG TABS Take 1,500 mg by mouth daily.    [name] Kitchen Hyaluronic Acid-Vitamin C (HYALURONIC ACID PO) Take 3.3 mg by mouth daily.    . hydrochlorothiazide (HYDRODIURIL) 12.5 MG tablet TAKE ONE TABLET BY MOUTH DAILY 90 tablet 1  . Investigational vitamin D 600 UNITS capsule SWOG S0812 Take 625 Units  by mouth daily. Take with food.    [name] Kitchen levothyroxine (SYNTHROID) 75 MCG tablet TAKE ONE TABLET BY MOUTH DAILY 90 tablet 1  . losartan (COZAAR) 50 MG tablet TAKE ONE TABLET BY MOUTH DAILY 90 tablet 0  . metFORMIN (GLUCOPHAGE) 500 MG tablet Take 0.5 tablets (250 mg total) by mouth daily with breakfast. 45 tablet 3  . Misc Natural Products (TART CHERRY ADVANCED PO) Take 800 mg by mouth daily.    . Probiotic Product (PROBIOTIC ADVANCED PO) Take 1 tablet by mouth daily.    . vitamin E 400 UNIT capsule Take 800 Units by mouth daily.     No current facility-administered medications for this visit.      Objective:  BP (!) 146/84   Pulse 81   Temp 97.9 F (36.6 C) (Temporal)   Ht 5' 8.5" (1.74 m)   Wt 182 lb 12.8 oz (82.9 kg)   SpO2 95%   BMI 27.39 kg/m  Gen: NAD, resting comfortably, wears mask CV: RRR faint murmur Lungs: CTAB no crackles, wheeze, rhonchi Abdomen: soft/nontender/nondistended Ext: no edema Skin: warm, dry    Assessment and Plan   Hyperglycemia S: Patient with significant improvement in A1c down from 6.3-5.9 on Metformin half tablet with breakfast. -weight is up 7 lbs  from earlier this year- thinks needs to cut down on portoins Lab Results  Component Value Date   HGBA1C 5.9 11/27/2018  A/P: Congratulated patient on improvement-we will continue current dose and recheck in 4 to 6 months -has labs in January but will be too soon for a1c so likely would do sometime mid next year again- perhaps June to july  #Attention deficit disorder (ADD) in adult S:Adderall 30mg  1/2 tab twice a day he does not need refill today will send my chart message when needed.  Since the last visit has the patient had any:  Appetite changes? No Unintentional weight loss? No Is medication working well ? Yes Does patient take drug holidays? No Difficulties falling to sleep or maintaining sleep? No Any anxiety?  Yes but no significant increase from baseline Any cardiac issues (fainting or  paliptations)? No Suicidal thoughts? No Changes in health since last visit? No New medications? No Any illicit substance abuse? No Has the patient taken his medication today? Yes A/P: Doing well-continue current medication-if had increase in anxiety in the future may need to consider psychiatry referral  #hyperlipidemia S: compliant with atorvastatin 40 mg daily Lab Results  Component Value Date   CHOL 131 10/23/2017   HDL 42.90 10/23/2017   LDLCALC 73 10/23/2017   LDLDIRECT 89.0 08/18/2018   TRIG 79.0 10/23/2017   CHOLHDL 3 10/23/2017   A/P: Update lipid panel in January hopefully with LDL 70 or less-likely to focus on healthy eating/regular exercise instead of increasing medicine as could also increase diabetes risk at higher dose  #hypothyroidism S: compliant On thyroid medication-levothyroxine 75 mcg Lab Results  Component Value Date   TSH 2.19 08/18/2018  A/P: Stable. Continue current medications.  Since we are getting labs in january-we will get TSH   #hypertension S: compliant with hydrochlorothiazide 12.5 mg, losartan 50 mg.  Home readings high 130s/80s.  Has been taking some naproxen BP Readings from Last 3 Encounters:  12/01/18 (!) 146/84  03/23/18 122/72  02/05/18 138/72  A/P: mild poor control From avs "Continue hctz 12.5mg , losartan 50mg . Try to cut naproxen, get weight back down, monitor home blood pressure and if still over [id number] systolic or over 90 diastolic- we will increase losartan to 100mg - can send me mychart message if running high. Also love your idea of going with family on the walks. " - did have coffee this AM and naproxen last night which could both potentially increase BP  #OCD/anxiety S: Has seen Trey Paula in the past- last visit pre covid.  Continues on Lexapro 10 mg. Feels reasonable control- projects can get to him. Does find working outside therapeutic  No SI.  A/P: Stable. Continue current medications.  - could go back to [facility] in future if  needed    Recommended follow up:  January physical planned  Future Appointments  Date Time Provider [place]  01/29/2019  9:00 AM [facility]  02/02/2019  9:40 AM [name], MD [facility]  09/14/2019  9:15 AM Hayden Pedro, MD [facility] None    Lab/Order associations:   ICD-10-CM   1. Hyperglycemia  R73.9   2. Attention deficit disorder (ADD) in adult  F98.8   3. Obsessive-compulsive disorder, unspecified type  F42.9   4. Hypothyroidism, unspecified type  E03.9   5. Essential hypertension  I10   6. Hyperlipidemia, unspecified hyperlipidemia type  E78.5    Return precautions advised.  [name], MD

## 2018-12-01 NOTE — Progress Notes (Signed)
I have reviewed and agree with note, evaluation, plan with 1 exception  Courtney-please update your note-patient does not have diabetes.  Has insulin resistance/hyperglycemia/prediabetes but not diabetes.  He is on Metformin for diabetes prevention  [name], MD

## 2018-12-01 NOTE — Progress Notes (Signed)
Subjective:   Darius Long. is a 73 y.o. male who presents for an Initial Medicare Annual Wellness Visit.  Review of Systems   Cardiac Risk Factors include: advanced age (>44mn, >>41women);male gender;hypertension;dyslipidemia   Objective:    Today's Vitals   12/01/18 1132  BP: (!) 142/74  Temp: 97.9 F (36.6 C)  Weight: 182 lb 12.2 oz (82.9 kg)  Height: 5' 8.5" (1.74 m)   Body mass index is 27.38 kg/m.  Advanced Directives 12/01/2018  Does Patient Have a Medical Advance Directive? Yes  Type of Advance Directive Living will;Healthcare Power of Attorney  Does patient want to make changes to medical advance directive? No - Patient declined  [place]? Yes - validated most recent copy scanned in chart (See row information)    Current Medications (verified) Outpatient Encounter Medications as of 12/01/2018  Medication Sig  . amphetamine-dextroamphetamine (ADDERALL) 30 MG tablet Take 0.5 tablets by mouth 2 (two) times daily.  .[name] Kitchenatorvastatin (LIPITOR) 40 MG tablet Take 1 tablet (40 mg total) by mouth daily.  . calcium carbonate (TUMS - DOSED IN MG ELEMENTAL CALCIUM) 500 MG chewable tablet Chew 1 tablet by mouth daily.  . CHONDROITIN SULFATE PO Take 200 mg by mouth daily.  . DHA-EPA-GLA PO Take 690 mg by mouth daily.   . diclofenac sodium (VOLTAREN) 1 % GEL APPLY 2 GRAMS TOPICALLY AT BEDTIME  . escitalopram (LEXAPRO) 10 MG tablet Take 1 tablet (10 mg total) by mouth daily.  . Glucosamine HCl 1500 MG TABS Take 1,500 mg by mouth daily.  .[name] KitchenHyaluronic Acid-Vitamin C (HYALURONIC ACID PO) Take 3.3 mg by mouth daily.  . hydrochlorothiazide (HYDRODIURIL) 12.5 MG tablet TAKE ONE TABLET BY MOUTH DAILY  . Investigational vitamin D 600 UNITS capsule SWOG S0812 Take 625 Units by mouth daily. Take with food.  .[name] Kitchenlevothyroxine (SYNTHROID) 75 MCG tablet TAKE ONE TABLET BY MOUTH DAILY  . losartan (COZAAR) 50 MG tablet TAKE ONE TABLET BY MOUTH  DAILY  . metFORMIN (GLUCOPHAGE) 500 MG tablet Take 0.5 tablets (250 mg total) by mouth daily with breakfast.  . Misc Natural Products (TART CHERRY ADVANCED PO) Take 800 mg by mouth daily.  . Probiotic Product (PROBIOTIC ADVANCED PO) Take 1 tablet by mouth daily.  . vitamin E 400 UNIT capsule Take 800 Units by mouth daily.   No facility-administered encounter medications on file as of 12/01/2018.     Allergies (verified) Patient has no known allergies.   History: Past Medical History:  Diagnosis Date  . Arthritis   . Attention deficit disorder (ADD) in adult    Adderall 357mtablet- half tablet twice a day.   . Chicken pox   . Colon polyp    dad died of colon cancer- q5 year colonoscopy. 2018  . Encounter for blood transfusion    related to overtreatment with PCN. became very ill.   . [name] Kitcheneart murmur    Sees cardiology October each year due to murmur after fall- get records. Seen for heart murmur heard 2010. We discused may not need to see cardiology. Every other year stres test  . Hyperglycemia    a1c 5.9 in 03/2017  . Hyperlipidemia    atorvastatin 40101m. Hypertension    losartan 50, hctz 12.5mg46m Hypothyroidism    levothyroxine 100 mcg 6 days a week as of mid 2019.   . Seasonal allergies    prn antihistamine   Past Surgical History:  Procedure Laterality Date  . Arthroscopic surgery Left 1986-1996   Damaged cartilage Left knee  . BACK SURGERY Bilateral 2008   Injury due to a fall. 2 fractured vertebrae, 3 fractured ribs fracture L scapulla, mild concussion  . CHOLECYSTECTOMY  2014  . Relocation surgery Right    For right elbow ulna nerve   . REPAIR of torn retina Right 2014  . TONSILECTOMY, ADENOIDECTOMY, BILATERAL MYRINGOTOMY AND TUBES  1958  . vena Laser   2009   Remaoval of varicous veins   Family History  Problem Relation Age of Onset  . Arthritis Mother        passed at age 14  . Miscarriages / [place] Mother   . Hearing loss Father   . Colon cancer  Father        died at 14  . Other Daughter        diabetes educator  . Heart disease Paternal Grandmother   . Miscarriages / [place] Daughter   . Diabetes Mellitus I Daughter        in [place] History   Socioeconomic History  . Marital status: Married    Spouse name: Not on file  . Number of children: Not on file  . Years of education: Not on file  . Highest education level: Not on file  Occupational History  . Occupation: Retired   Scientific laboratory technician  . Financial resource strain: Not on file  . Food insecurity    Worry: Not on file    Inability: Not on file  . Transportation needs    Medical: Not on file    Non-medical: Not on file  Tobacco Use  . Smoking status: Never Smoker  . Smokeless tobacco: Never Used  Substance and Sexual Activity  . Alcohol use: Not Currently  . Drug use: Never  . Sexual activity: Not Currently  Lifestyle  . Physical activity    Days per week: Not on file    Minutes per session: Not on file  . Stress: Not on file  Relationships  . Social Herbalist on phone: Not on file    Gets together: Not on file    Attends religious service: Not on file    Active member of club or organization: Not on file    Attends meetings of clubs or organizations: Not on file    Relationship status: Not on file  Other Topics Concern  . Not on file  Social History Narrative   Married. Twin daughters. 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 bring in CBG monitor from home?: No  How often do you need to have someone help you when you read instructions, pamphlets, or other written materials from your doctor or pharmacy?: 1 - Never  Interpreter Needed?:  No  Information entered by :: Denman George LPN  Activities of Daily Living In your present state of health, do you have any difficulty performing the following activities: 12/01/2018  Hearing? N  Vision? N  Difficulty concentrating or making decisions? N  Walking or climbing stairs? N  Dressing or bathing? N  Doing errands, shopping? N  Preparing Food  and eating ? N  Using the Toilet? N  In the past six months, have you accidently leaked urine? N  Do you have problems with loss of bowel control? N  Managing your Medications? N  Managing your Finances? N  Housekeeping or managing your Housekeeping? N  Some recent data might be hidden     Immunizations and Health Maintenance Immunization History  Administered Date(s) Administered  . Fluad Quad(high Dose 65+) 09/22/2018  . Influenza, High Dose Seasonal PF 10/10/2017  . MMR 04/14/2013  . Pneumococcal Conjugate-13 07/01/2013  . Pneumococcal Polysaccharide-23 01/03/2012  . Tdap 05/17/2013  . Zoster 08/02/2010  . Zoster Recombinat (Shingrix) 10/12/2017, 02/17/2018   There are no preventive care reminders to display for this patient.  Patient Care Team: [name], MD as PCP - General (Family Medicine) [facility] as Consulting Physician Croitoru, Dani Gobble, MD as Consulting Physician (Cardiology) Hayden Pedro, MD as Consulting Physician (Ophthalmology)  Indicate any recent Medical Services you may have received from other than [facility] providers in the past year (date may be approximate).    Assessment:   This is a routine wellness examination for Yovan.  Hearing/Vision screen No exam data present  Dietary issues and exercise activities discussed: Current Exercise Habits: Home exercise routine, Type of exercise: walking(yardwork), Time (Minutes): 45, Frequency (Times/Week): 3, Weekly Exercise (Minutes/Week): 135, Intensity: Mild  Goals   None    Depression Screen PHQ 2/9 Scores 02/05/2018  01/01/2018 10/23/2017  PHQ - 2 Score 1 1 0  PHQ- 9 Score 3 4 -    Fall Risk Fall Risk  12/01/2018 10/23/2017  Falls in the past year? 0 No  Injury with Fall? 0 -  Follow up Falls evaluation completed;Education provided;Falls prevention discussed -    Is the patient's home free of loose throw rugs in walkways, pet beds, electrical cords, etc?   yes      Grab bars in the bathroom? yes      Handrails on the stairs?   yes      Adequate lighting?   yes  Timed Get Up and Go performed: completed and within normal timeframe; no gait abnormalities noted   Cognitive Function: no cognitive concerns at this time    6CIT Screen 12/01/2018  What Year? 0 points  What month? 0 points  What time? 0 points  Count back from 20 0 points  Months in reverse 0 points  Repeat phrase 0 points  Total Score 0    Screening Tests Health Maintenance  Topic Date Due  . TETANUS/TDAP  05/18/2023  . COLONOSCOPY  10/19/2023  . INFLUENZA VACCINE  Completed  . Hepatitis C Screening  Completed  . PNA vac Low Risk Adult  Completed    Qualifies for Shingles Vaccine? Shingrix completed   Cancer Screenings: Lung: Low Dose CT Chest recommended if Age 72-80 years, 30 pack-year currently smoking OR have quit w/in 15years. Patient does not qualify. Colorectal: colonoscopy 10/18/13     Plan:  I have personally reviewed and addressed the Medicare Annual Wellness questionnaire and have noted the following in the patient's chart:  A. Medical and social history B. Use of alcohol, tobacco or illicit drugs  C. Current medications and supplements D. Functional ability and status E.  Nutritional status F.  Physical activity G. Advance directives H. List of other physicians I.  Hospitalizations, surgeries, and ER visits in previous 12 months J.  [place] such as hearing and vision if needed, cognitive and  depression L. Referrals, records requested, and appointments- none   In addition, I have reviewed  and discussed with patient certain preventive protocols, quality metrics, and best practice recommendations. A written personalized care plan for preventive services as well as general preventive health recommendations were provided to patient.   Signed,  Denman George, LPN  Nurse Health Advisor   Nurse Notes: no additional

## 2018-12-01 NOTE — Patient Instructions (Addendum)
Continue hctz 12.5mg , losartan 50mg . Try to cut naproxen out, get weight back down, monitor home blood pressure and if still over [id number] systolic or over 90 diastolic- we will increase losartan to 100mg - can send me mychart message if running high. Also love your idea of going with family on the walks.   Glad you have labs planned for January and we will follow up on those a few days later on 02/01/2018

## 2018-12-01 NOTE — Patient Instructions (Signed)
Darius Long , Thank you for taking time to come for your Medicare Wellness Visit. I appreciate your ongoing commitment to your health goals. Please review the following plan we discussed and let me know if I can assist you in the future.   Screening recommendations/referrals: Colorectal Screening: up to date; last colonscopy 10/18/13  Vision and Dental Exams: Recommended annual ophthalmology exams for early detection of glaucoma and other disorders of the eye Recommended annual dental exams for proper oral hygiene  Diabetic Exams: Diabetic Eye Exam: recommended yearly; up to date Diabetic Foot Exam: recommended yearly; done today  Vaccinations: Influenza vaccine: completed 09/22/18 /Pneumococcal vaccine: up to date; last 05/17/13 Tdap vaccine: up to date; last 05/17/13 Shingles vaccine: Shingrix completed   Advanced directives: We have received a copy of your POA (Power of [place]) and/or Living Will. These documents can be located in your chart.  Goals: Recommend to drink at least 6-8 8oz glasses of water per day and consume a balanced diet rich in fresh fruits and vegetables.   Next appointment: Please schedule your Annual Wellness Visit with your Nurse Health Advisor in one year.  Preventive Care 63 Years and Older, Male Preventive care refers to lifestyle choices and visits with your health care provider that can promote health and wellness. What does preventive care include?  A yearly physical exam. This is also called an annual well check.  Dental exams once or twice a year.  Routine eye exams. Ask your health care provider how often you should have your eyes checked.  Personal lifestyle choices, including:  Daily care of your teeth and gums.  Regular physical activity.  Eating a healthy diet.  Avoiding tobacco and drug use.  Limiting alcohol use.  Practicing safe sex.  Taking low doses of aspirin every day if recommended by your health care provider..  Taking  vitamin and mineral supplements as recommended by your health care provider. What happens during an annual well check? The services and screenings done by your health care provider during your annual well check will depend on your age, overall health, lifestyle risk factors, and family history of disease. Counseling  Your health care provider may ask you questions about your:  Alcohol use.  Tobacco use.  Drug use.  Emotional well-being.  Home and relationship well-being.  Sexual activity.  Eating habits.  History of falls.  Memory and ability to understand (cognition).  Work and work Statistician. Screening  You may have the following tests or measurements:  Height, weight, and BMI.  Blood pressure.  Lipid and cholesterol levels. These may be checked every 5 years, or more frequently if you are over 74 years old.  Skin check.  Lung cancer screening. You may have this screening every year starting at age 74 if you have a 30-pack-year history of smoking and currently smoke or have quit within the past 15 years.  Fecal occult blood test (FOBT) of the stool. You may have this test every year starting at age 74.  Flexible sigmoidoscopy or colonoscopy. You may have a sigmoidoscopy every 5 years or a colonoscopy every 10 years starting at age 74.  Prostate cancer screening. Recommendations will vary depending on your family history and other risks.  Hepatitis C blood test.  Hepatitis B blood test.  Sexually transmitted disease (STD) testing.  Diabetes screening. This is done by checking your blood sugar (glucose) after you have not eaten for a while (fasting). You may have this done every 1-3 years.  Abdominal aortic  aneurysm (AAA) screening. You may need this if you are a current or former smoker.  Osteoporosis. You may be screened starting at age 74 if you are at high risk. Talk with your health care provider about your test results, treatment options, and if  necessary, the need for more tests. Vaccines  Your health care provider may recommend certain vaccines, such as:  Influenza vaccine. This is recommended every year.  Tetanus, diphtheria, and acellular pertussis (Tdap, Td) vaccine. You may need a Td booster every 10 years.  Zoster vaccine. You may need this after age 40.  Pneumococcal 13-valent conjugate (PCV13) vaccine. One dose is recommended after age 61.  Pneumococcal polysaccharide (PPSV23) vaccine. One dose is recommended after age 61. Talk to your health care provider about which screenings and vaccines you need and how often you need them. This information is not intended to replace advice given to you by your health care provider. Make sure you discuss any questions you have with your health care provider. Document Released: 02/03/2015 Document Revised: 09/27/2015 Document Reviewed: 11/08/2014 Elsevier Interactive Patient Education  2017 [place] Prevention in the Home Falls can cause injuries. They can happen to people of all ages. There are many things you can do to make your home safe and to help prevent falls. What can I do on the outside of my home?  Regularly fix the edges of walkways and driveways and fix any cracks.  Remove anything that might make you trip as you walk through a door, such as a raised step or threshold.  Trim any bushes or trees on the path to your home.  Use bright outdoor lighting.  Clear any walking paths of anything that might make someone trip, such as rocks or tools.  Regularly check to see if handrails are loose or broken. Make sure that both sides of any steps have handrails.  Any raised decks and porches should have guardrails on the edges.  Have any leaves, snow, or ice cleared regularly.  Use sand or salt on walking paths during winter.  Clean up any spills in your garage right away. This includes oil or grease spills. What can I do in the bathroom?  Use night lights.   Install grab bars by the toilet and in the tub and shower. Do not use towel bars as grab bars.  Use non-skid mats or decals in the tub or shower.  If you need to sit down in the shower, use a plastic, non-slip stool.  Keep the floor dry. Clean up any water that spills on the floor as soon as it happens.  Remove soap buildup in the tub or shower regularly.  Attach bath mats securely with double-sided non-slip rug tape.  Do not have throw rugs and other things on the floor that can make you trip. What can I do in the bedroom?  Use night lights.  Make sure that you have a light by your bed that is easy to reach.  Do not use any sheets or blankets that are too big for your bed. They should not hang down onto the floor.  Have a firm chair that has side arms. You can use this for support while you get dressed.  Do not have throw rugs and other things on the floor that can make you trip. What can I do in the kitchen?  Clean up any spills right away.  Avoid walking on wet floors.  Keep items that you use a lot  in easy-to-reach places.  If you need to reach something above you, use a strong step stool that has a grab bar.  Keep electrical cords out of the way.  Do not use floor polish or wax that makes floors slippery. If you must use wax, use non-skid floor wax.  Do not have throw rugs and other things on the floor that can make you trip. What can I do with my stairs?  Do not leave any items on the stairs.  Make sure that there are handrails on both sides of the stairs and use them. Fix handrails that are broken or loose. Make sure that handrails are as long as the stairways.  Check any carpeting to make sure that it is firmly attached to the stairs. Fix any carpet that is loose or worn.  Avoid having throw rugs at the top or bottom of the stairs. If you do have throw rugs, attach them to the floor with carpet tape.  Make sure that you have a light switch at the top of the  stairs and the bottom of the stairs. If you do not have them, ask someone to add them for you. What else can I do to help prevent falls?  Wear shoes that:  Do not have high heels.  Have rubber bottoms.  Are comfortable and fit you well.  Are closed at the toe. Do not wear sandals.  If you use a stepladder:  Make sure that it is fully opened. Do not climb a closed stepladder.  Make sure that both sides of the stepladder are locked into place.  Ask someone to hold it for you, if possible.  Clearly mark and make sure that you can see:  Any grab bars or handrails.  First and last steps.  Where the edge of each step is.  Use tools that help you move around (mobility aids) if they are needed. These include:  Canes.  Walkers.  Scooters.  Crutches.  Turn on the lights when you go into a dark area. Replace any light bulbs as soon as they burn out.  Set up your furniture so you have a clear path. Avoid moving your furniture around.  If any of your floors are uneven, fix them.  If there are any pets around you, be aware of where they are.  Review your medicines with your doctor. Some medicines can make you feel dizzy. This can increase your chance of falling. Ask your doctor what other things that you can do to help prevent falls. This information is not intended to replace advice given to you by your health care provider. Make sure you discuss any questions you have with your health care provider. Document Released: 11/03/2008 Document Revised: 06/15/2015 Document Reviewed: 02/11/2014 Elsevier Interactive Patient Education  2017 [employer].

## 2018-12-02 NOTE — Progress Notes (Signed)
Note addended- patient is not diabetic.  Updated to reflect prediabetes

## 2018-12-02 NOTE — Progress Notes (Signed)
Note corrected

## 2018-12-16 ENCOUNTER — Other Ambulatory Visit: Payer: Self-pay

## 2018-12-20 ENCOUNTER — Encounter: Payer: Self-pay | Admitting: Family Medicine

## 2018-12-21 MED ORDER — AMPHETAMINE-DEXTROAMPHETAMINE 30 MG PO TABS: 15.0000 mg | ORAL_TABLET | Freq: Two times a day (BID) | ORAL | 0 refills | Status: DC

## 2018-12-21 MED ORDER — LOSARTAN POTASSIUM 50 MG PO TABS: 50.0000 mg | ORAL_TABLET | Freq: Every day | ORAL | 0 refills | Status: DC

## 2018-12-21 NOTE — Telephone Encounter (Signed)
F/u App 02-02-2019 Last app 12/01/2018 Last script 10/22/2018 #60 with no r/f.  Last script was for 0.5 tab twice a day but was given 60?

## 2019-01-29 ENCOUNTER — Other Ambulatory Visit (INDEPENDENT_AMBULATORY_CARE_PROVIDER_SITE_OTHER): Payer: PPO

## 2019-01-29 DIAGNOSIS — Z0000 Encounter for general adult medical examination without abnormal findings: Secondary | ICD-10-CM

## 2019-01-29 DIAGNOSIS — Z79899 Other long term (current) drug therapy: Secondary | ICD-10-CM

## 2019-01-29 DIAGNOSIS — R739 Hyperglycemia, unspecified: Secondary | ICD-10-CM

## 2019-01-29 DIAGNOSIS — E039 Hypothyroidism, unspecified: Secondary | ICD-10-CM | POA: Diagnosis not present

## 2019-01-29 DIAGNOSIS — I10 Essential (primary) hypertension: Secondary | ICD-10-CM

## 2019-01-29 DIAGNOSIS — E785 Hyperlipidemia, unspecified: Secondary | ICD-10-CM

## 2019-01-29 LAB — CBC
HCT: 43.3 % (ref 39.0–52.0)
Hemoglobin: 14.4 g/dL (ref 13.0–17.0)
MCHC: 33.3 g/dL (ref 30.0–36.0)
MCV: 91.2 fl (ref 78.0–100.0)
Platelets: 238 10*3/uL (ref 150.0–400.0)
RBC: 4.74 Mil/uL (ref 4.22–5.81)
RDW: 13.3 % (ref 11.5–15.5)
WBC: 4.6 10*3/uL (ref 4.0–10.5)

## 2019-01-29 LAB — LIPID PANEL
Cholesterol: 132 mg/dL (ref 0–200)
HDL: 41.6 mg/dL (ref >39.00)
LDL Cholesterol: 80 mg/dL (ref 0–99)
NonHDL: 90.24
Total CHOL/HDL Ratio: 3
Triglycerides: 49 mg/dL (ref 0.0–149.0)
VLDL: 9.8 mg/dL (ref 0.0–40.0)

## 2019-01-29 LAB — COMPREHENSIVE METABOLIC PANEL
ALT: 40 U/L (ref 0–53)
AST: 48 U/L — ABNORMAL HIGH (ref 0–37)
Albumin: 4.1 g/dL (ref 3.5–5.2)
Alkaline Phosphatase: 48 U/L (ref 39–117)
BUN: 19 mg/dL (ref 6–23)
CO2: 33 mEq/L — ABNORMAL HIGH (ref 19–32)
Calcium: 8.7 mg/dL (ref 8.4–10.5)
Chloride: 101 mEq/L (ref 96–112)
Creatinine, Ser: 0.91 mg/dL (ref 0.40–1.50)
GFR: 81.35 mL/min (ref >60.00)
Glucose, Bld: 94 mg/dL (ref 70–99)
Potassium: 3.8 mEq/L (ref 3.5–5.1)
Sodium: 139 mEq/L (ref 135–145)
Total Bilirubin: 0.6 mg/dL (ref 0.2–1.2)
Total Protein: 6.2 g/dL (ref 6.0–8.3)

## 2019-01-29 LAB — TSH: TSH: 1.3 u[IU]/mL (ref 0.35–4.50)

## 2019-02-01 NOTE — Progress Notes (Signed)
Phone: [phone]   Subjective:  Patient presents today for their annual physical. Chief complaint-noted.   See problem oriented charting- Review of Systems  Constitutional: Negative.   HENT: Negative.   Eyes: Negative.   Respiratory: Negative.   Cardiovascular: Negative.   Gastrointestinal: Negative.   Genitourinary: Negative.   Musculoskeletal: Negative.   Skin: Negative.   Neurological: Negative.   Endo/Heme/Allergies: Negative.   Psychiatric/Behavioral: Negative.   other than some anxiety noted  The following were reviewed and entered/updated in epic: Past Medical History:  Diagnosis Date  . Arthritis   . Attention deficit disorder (ADD) in adult    Adderall 84m tablet- half tablet twice a day.   . Chicken pox   . Colon polyp    dad died of colon cancer- q5 year colonoscopy. 2018  . Encounter for blood transfusion    related to overtreatment with PCN. became very ill.   .[name] KitchenHeart murmur    Sees cardiology October each year due to murmur after fall- get records. Seen for heart murmur heard 2010. We discused may not need to see cardiology. Every other year stres test  . Hyperglycemia    a1c 5.9 in 03/2017  . Hyperlipidemia    atorvastatin 41m . Hypertension    losartan 50, hctz 12.66m4m. Hypothyroidism    levothyroxine 100 mcg 6 days a week as of mid 2019.   . Seasonal allergies    prn antihistamine   Patient Active Problem List   Diagnosis Date Noted  . OCD (obsessive compulsive disorder) 01/01/2018    Priority: High  . Attention deficit disorder (ADD) in adult     Priority: High  . Heart murmur     Priority: High  . Arthritis 07/07/2017    Priority: Medium  . Hypertension     Priority: Medium  . Hyperlipidemia     Priority: Medium  . Hypothyroidism     Priority: Medium  . Hyperglycemia     Priority: Medium  . Strain of right tibialis anterior muscle 10/30/2017    Priority: Low  . Seasonal allergies     Priority: Low  . Colon polyp    Priority: Low   Past Surgical History:  Procedure Laterality Date  . Arthroscopic surgery Left 1986-1996   Damaged cartilage Left knee  . BACK SURGERY Bilateral 2008   Injury due to a fall. 2 fractured vertebrae, 3 fractured ribs fracture L scapulla, mild concussion  . CHOLECYSTECTOMY  2014  . Relocation surgery Right    For right elbow ulna nerve   . REPAIR of torn retina Right 2014  . TONSILECTOMY, ADENOIDECTOMY, BILATERAL MYRINGOTOMY AND TUBES  1958  . vena Laser   2009   Remaoval of varicous veins    Family History  Problem Relation Age of Onset  . Arthritis Mother        passed at age 26 54 Miscarriages / [place]   . Hearing loss Father   . Colon cancer Father        died at 69 32 Other Daughter        diabetes educator  . Heart disease Paternal Grandmother   . Miscarriages / [place]   . Diabetes Mellitus [place] D[place]        [place] Medications- reviewed and updated Current Outpatient Medications  Medication Sig Dispense Refill  . amphetamine-dextroamphetamine (ADDERALL) 30 MG tablet Take 0.5 tablets by mouth 2 (two) times daily. 60 tablet 0  .  atorvastatin (LIPITOR) 40 MG tablet Take 1 tablet (40 mg total) by mouth daily. 90 tablet 1  . calcium carbonate (TUMS - DOSED IN MG ELEMENTAL CALCIUM) 500 MG chewable tablet Chew 1 tablet by mouth daily.    . CHONDROITIN SULFATE PO Take 200 mg by mouth daily.    . DHA-EPA-GLA PO Take 690 mg by mouth daily.     . diclofenac sodium (VOLTAREN) 1 % GEL APPLY 2 GRAMS TOPICALLY AT BEDTIME 100 g 2  . escitalopram (LEXAPRO) 10 MG tablet Take 1 tablet (10 mg total) by mouth daily. 90 tablet 3  . Glucosamine HCl 1500 MG TABS Take 1,500 mg by mouth daily.    [name] Kitchen Hyaluronic Acid-Vitamin C (HYALURONIC ACID PO) Take 3.3 mg by mouth daily.    . hydrochlorothiazide (HYDRODIURIL) 12.5 MG tablet TAKE ONE TABLET BY MOUTH DAILY 90 tablet 1  . Investigational vitamin D 600 UNITS capsule SWOG S0812 Take 625 Units by  mouth daily. Take with food.    [name] Kitchen levothyroxine (SYNTHROID) 75 MCG tablet TAKE ONE TABLET BY MOUTH DAILY 90 tablet 1  . losartan (COZAAR) 50 MG tablet Take 1 tablet (50 mg total) by mouth daily. 90 tablet 0  . metFORMIN (GLUCOPHAGE) 500 MG tablet Take 0.5 tablets (250 mg total) by mouth daily with breakfast. 45 tablet 3  . Misc Natural Products (TART CHERRY ADVANCED PO) Take 800 mg by mouth daily.    . Probiotic Product (PROBIOTIC ADVANCED PO) Take 1 tablet by mouth daily.    . vitamin E 400 UNIT capsule Take 800 Units by mouth daily.     No current facility-administered medications for this visit.    Allergies-reviewed and updated No Known Allergies  Social History   Social History Narrative   Married. Twin daughters. 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 TM normal. Bridge of nose normal. Eyelids normal.  Neck: no thyromegaly or cervical lymphadenopathy  CV: RRR. faint murmur. No  rubs or gallops Lungs: CTAB no crackles, wheeze, rhonchi Abdomen: soft/nontender/nondistended/normal bowel sounds. No rebound or guarding.  Ext: no edema Skin: warm, dry Neuro: grossly normal, moves all extremities, PERRLA Msk: lipoma on right shoulder- he states unchanged from baseline    Assessment and Plan  74 y.o. male presenting for annual physical.  Health Maintenance counseling: 1. Anticipatory guidance: Patient counseled regarding regular dental exams q6 months, eye exams yearly,  avoiding smoking and second hand smoke , limiting alcohol to 2 beverages per day . Very rarely may have a glass of wine 1 time a year.   2. Risk factor reduction:   Advised patient of need for regular exercise and diet rich and fruits and vegetables to reduce risk of heart attack and stroke. Exercise- Walks as much as he can. He is very active around home. . Diet-Has cut back on carbohydrates due blood sugars. Daughter is diabetic educator and has been working with him on diet. . Weight down 3 lbs from last visit- congratulated on progress.  Wt Readings from Last 3  Encounters:  02/02/19 179 lb (81.2 kg)  12/01/18 182 lb 12.2 oz (82.9 kg)  12/01/18 182 lb 12.8 oz (82.9 kg)  3. Immunizations/screenings/ancillary studies- interested in covid vaccine when available Immunization History  Administered Date(s) Administered  . Fluad Quad(high Dose 65+) 09/22/2018  . Influenza, High Dose Seasonal PF 10/10/2017  . MMR 04/14/2013  . Pneumococcal Conjugate-13 07/01/2013  . Pneumococcal Polysaccharide-23 01/03/2012  . Tdap 05/17/2013  . Zoster 08/02/2010  . Zoster Recombinat (Shingrix) 10/12/2017, 02/17/2018  4. Prostate cancer screening- passed age based screening for prostate cancer  Lab Results  Component Value Date   PSA 1.22 04/08/2017   5. Colon cancer screening -due to family history per his records should be due in October this year. We are requesting records- need these on file to be able to schedule this. Father had colon cancer. 11/12/16 last colonoscopy in [facility]. He prefers to see Dr. Loletha Carrow  6. Skin cancer screening- has seen derm in past- has not set up locally- had been seen in [facility] advised regular sunscreen use. Denies worrisome, changing, or new skin lesions.  7. Never smoker  Status of chronic or acute concerns   Obsessive-compulsive disorder, unspecified type. Compliant with lexapro 10 mg  Attention deficit disorder (ADD) in adult- doing well on medication adderrall 30 mg half tablet twice a day. Refill provided today.  - nccsrs reviewed- only refills through [place] -[facility] was 01/29/2019- says active collected but did not result. To team  "[facility] did not process for some reason- can you check on this- have him back in if we cant get it- or can do next visit" - Dr. Blane Ohara of psychology diagnosed - Controlled substance contract signed 10/23/17  Heart murmur- from Dr. Loletha Grayer on 01/02/18 "Good news on echo. Just some very minor abnormalities related to typical changes with age. No serious valve problems to explain the murmur."  Essential hypertension- reasonable control on losartn 50 mg and hctz 12.5 mg. He brings extensive home records and looks like in general low 140s to high 130s. Range on daily averages from 129 to 149 for the most part- a few outliers. Controlled today- his only concern is waking up 2-3 x a night even with reducing PM fluid intake. - he wants to consider stopping hctz and transitioing to amlodipine 5 mg- he is going to think this over and when gets toward the end of current hctz bottle- will message me about potentially changing to amlodipine 5 mg from hctz 12.5 mg.   Hyperlipidemia, unspecified hyperlipidemia type- reasonable but not ideal control on atorvastatin 40 mg. We did not feel strongly about increasing dose especially with mild LFT elevation - he asks for full lipid panel next visit  Hypothyroidism, unspecified type- thyroid looked good on most recent check- continue levothyroxine 75 mcg Lab Results  Component Value Date   TSH 1.30 01/29/2019   Wants to consider cialis- he is going to talk to wife. Has had some success in past with this. Would want to have BP on stable regimen before change.   Hyperglycemia- a1c improved on last check- continue lifestyle changes and follow - scheduled for 6 month labs   Recommended follow up: Return in about 6 months (around 08/02/2019). Future Appointments  Date Time Provider [place]  09/14/2019  9:15 AM Hayden Pedro, MD [facility] None   Lab/Order associations: not  Fasting- already had labs done   ICD-10-CM   1. Preventative health care  Z00.00   2.  Obsessive-compulsive disorder,  unspecified type  F42.9   3. Attention deficit disorder (ADD) in adult  F98.8   4. Heart murmur  R01.1   5. Essential hypertension  I10   6. Hyperlipidemia, unspecified hyperlipidemia type  E78.5   7. Hypothyroidism, unspecified type  E03.9   8. Hyperglycemia  R73.9   9. Polyp of colon, unspecified part of colon, unspecified type  K63.5     No orders of the defined types were placed in this encounter.   Return precautions advised.  [name], MD

## 2019-02-02 ENCOUNTER — Encounter: Payer: Self-pay | Admitting: Family Medicine

## 2019-02-02 ENCOUNTER — Ambulatory Visit (INDEPENDENT_AMBULATORY_CARE_PROVIDER_SITE_OTHER): Payer: PPO | Admitting: Family Medicine

## 2019-02-02 ENCOUNTER — Other Ambulatory Visit: Payer: Self-pay

## 2019-02-02 VITALS — BP 136/64 | HR 75 | Temp 97.6°F | Ht 68.5 in | Wt 179.0 lb

## 2019-02-02 DIAGNOSIS — F429 Obsessive-compulsive disorder, unspecified: Secondary | ICD-10-CM | POA: Diagnosis not present

## 2019-02-02 DIAGNOSIS — E039 Hypothyroidism, unspecified: Secondary | ICD-10-CM | POA: Diagnosis not present

## 2019-02-02 DIAGNOSIS — Z0000 Encounter for general adult medical examination without abnormal findings: Secondary | ICD-10-CM | POA: Diagnosis not present

## 2019-02-02 DIAGNOSIS — E785 Hyperlipidemia, unspecified: Secondary | ICD-10-CM

## 2019-02-02 DIAGNOSIS — I10 Essential (primary) hypertension: Secondary | ICD-10-CM | POA: Diagnosis not present

## 2019-02-02 DIAGNOSIS — R011 Cardiac murmur, unspecified: Secondary | ICD-10-CM | POA: Diagnosis not present

## 2019-02-02 DIAGNOSIS — F988 Other specified behavioral and emotional disorders with onset usually occurring in childhood and adolescence: Secondary | ICD-10-CM

## 2019-02-02 DIAGNOSIS — R739 Hyperglycemia, unspecified: Secondary | ICD-10-CM | POA: Diagnosis not present

## 2019-02-02 DIAGNOSIS — K635 Polyp of colon: Secondary | ICD-10-CM | POA: Diagnosis not present

## 2019-02-02 LAB — PAIN MGMT, PROFILE 8 W/CONF, U
6 Acetylmorphine: NEGATIVE ng/mL
Alcohol Metabolites: NEGATIVE ng/mL (ref <500)
Amphetamine: 2075 ng/mL
Amphetamines: POSITIVE ng/mL
Benzodiazepines: NEGATIVE ng/mL
Buprenorphine, Urine: NEGATIVE ng/mL
Cocaine Metabolite: NEGATIVE ng/mL
Creatinine: 141.2 mg/dL
MDMA: NEGATIVE ng/mL
Marijuana Metabolite: NEGATIVE ng/mL
Methamphetamine: NEGATIVE ng/mL
Opiates: NEGATIVE ng/mL
Oxidant: NEGATIVE ug/mL
Oxycodone: NEGATIVE ng/mL
pH: 7 (ref 4.5–9.0)

## 2019-02-02 MED ORDER — LEVOTHYROXINE SODIUM 75 MCG PO TABS: 75.0000 ug | ORAL_TABLET | Freq: Every day | ORAL | 1 refills | Status: DC

## 2019-02-02 MED ORDER — ESCITALOPRAM OXALATE 10 MG PO TABS: 10.0000 mg | ORAL_TABLET | Freq: Every day | ORAL | 3 refills | Status: DC

## 2019-02-02 MED ORDER — AMPHETAMINE-DEXTROAMPHETAMINE 30 MG PO TABS: 15.0000 mg | ORAL_TABLET | Freq: Two times a day (BID) | ORAL | 0 refills | Status: DC

## 2019-02-02 MED ORDER — ATORVASTATIN CALCIUM 40 MG PO TABS: 40.0000 mg | ORAL_TABLET | Freq: Every day | ORAL | 1 refills | Status: DC

## 2019-02-02 MED ORDER — HYDROCHLOROTHIAZIDE 12.5 MG PO TABS: 12.5000 mg | ORAL_TABLET | Freq: Every day | ORAL | 1 refills | Status: DC

## 2019-02-02 MED ORDER — LOSARTAN POTASSIUM 50 MG PO TABS: 50.0000 mg | ORAL_TABLET | Freq: Every day | ORAL | 0 refills | Status: DC

## 2019-02-02 NOTE — Patient Instructions (Addendum)
Can schedule labs before next visit in 6 months. If we make changes in blood pressure or blood pressure trends up - we would want to see you 1-2 months after the change.   Recommended follow up: Return in about 6 months (around 08/02/2019).     We are committed to keeping you informed about the COVID-19 vaccine.  As the vaccine continues to become available for each phase, we will ensure that patients who meet the criteria receive the information they need to access vaccination opportunities. Continue to check your MyChart account and [email or web address] for updates. Please review the Phase 1b information below.  Following [place]'s guidelines for the distribution of COVID-19 vaccines, we are pleased to share our plans to begin offering vaccines to those 75 and older (Phase 1b). Here are details of those plans:  [place] COVID-19 Vaccination [facility] . Appointments required. [name] Open to those age 12 or older . Not restricted to [facility] or [facility] residents . Location: [place], [place], [place]  . Offered daily beginning: Saturday, January 30, 2019 . Hours: 8 a.m. to 1 p.m. (10 a.m. to 2 p.m. this Saturday and Sunday, Jan. 9 and 10) . Registration for vaccine clinic appointments is open as of 10 a.m., Friday, January 8 . Please visit [email or web address] to register or call [phone] . There will be no copay required for the vaccine. Insurance information will be requested if available.  [place] will offer this vaccination clinic through Sunday, January 17, after which we will switch to a larger location to offer public vaccination at a larger scale following state guidelines. We will provide additional information as details become available.   Also, people who are 75 years and older can sign up to get vaccinated against COVID-19 at clinics that begin Monday through the [facility]. The  vaccinations are open to all in that age group, regardless of their health condition or living situation.  Appointments are required and can be made beginning at 8 a.m. Friday by calling [phone] and selecting option 2. Walk-ins will not be accepted. Clinic locations are: [name], [place]; [name] [address], [place]; . [place] at [facility], [address], [id number], [place].  Participants are asked to wear a face covering at vaccination sites. Visit [email or web address] and click on the "[id number] Vaccine Info" rectangle for more information about vaccinations.  In addition to the clinics above, we are working in partnership with county health agencies in [place], [place], [place] and [place] counties to ensure continuing vaccination availability in alignment with state guidelines in the weeks and months ahead.   Information on phase 1b COVID-19 vaccination clinics being offered by local county health agencies is provided in the website links below for your convenience:   . [place]  . [place]  . [place] . [place]  . [place] phase 1b vaccination guidelines, prioritizing those 75 and over as the next eligible group to receive the COVID-19 vaccine, are detailed at [email or web address].   Additional information: Those 75 and older who register for 's COVID-19 vaccination clinic at [facility] will be provided with additional information, including the need to be observed for 15 minutes following vaccination for your safety. Our COVID-19 testing clinic will not start at this site until 2 p.m. daily to ensure no people arriving  for vaccination intersect with those seeking a COVID-19 test. [place] rooms at the [facility] are clean and safe, and our staff will  use all appropriate protective equipment to keep you safe. This vaccine clinic will be located in a completely separate area of this facility than where health care is provided. No interaction will occur between patients or care teams at this site and our vaccination clinic.   As we proceed through phases of the vaccine rollout, we remind everyone to remain vigilant in practicing the 3 W's - wear a mask, wash your hands and wait 6 feet apart from others. These safety practices are based in science and are the best tool we have to reduce the spread of the virus.   For our most current information, please visit [email or web address].

## 2019-02-04 MED ORDER — AMLODIPINE BESYLATE 5 MG PO TABS: 5.0000 mg | ORAL_TABLET | Freq: Every day | ORAL | 3 refills | Status: DC

## 2019-02-15 ENCOUNTER — Encounter: Payer: Self-pay | Admitting: Family Medicine

## 2019-02-28 ENCOUNTER — Ambulatory Visit: Payer: PPO | Attending: Internal Medicine

## 2019-02-28 DIAGNOSIS — Z23 Encounter for immunization: Secondary | ICD-10-CM | POA: Insufficient documentation

## 2019-02-28 NOTE — Progress Notes (Signed)
   [id number] Vaccination Clinic  Name:  Darius Long.    MRN: [id number] DOB: [birth year]  02/28/2019  [name] was observed post Covid-19 immunization for 15 minutes without incidence. He was provided with Vaccine Information Sheet and instruction to access the V-Safe system.   [name] was instructed to call 911 with any severe reactions post vaccine: [name] Difficulty breathing  . Swelling of your face and throat  . A fast heartbeat  . A bad rash all over your body  . Dizziness and weakness    Immunizations Administered    Name Date Dose VIS Date Route   Pfizer COVID-19 Vaccine 02/28/2019  2:16 PM 0.3 mL 01/01/2019 Intramuscular   Manufacturer: [place]   Lot: [id number]   [place]: [id number]

## 2019-03-15 ENCOUNTER — Ambulatory Visit: Payer: PPO

## 2019-03-25 ENCOUNTER — Ambulatory Visit: Payer: PPO | Attending: Internal Medicine

## 2019-03-25 DIAGNOSIS — Z23 Encounter for immunization: Secondary | ICD-10-CM

## 2019-03-25 NOTE — Progress Notes (Signed)
   [id number] Vaccination Clinic  Name:  Darius Long.    MRN: [id number] DOB: [birth year]  03/25/2019  [name] was observed post Covid-19 immunization for 15 minutes without incident. He was provided with Vaccine Information Sheet and instruction to access the V-Safe system.   [name] was instructed to call 911 with any severe reactions post vaccine: [name] Difficulty breathing  . Swelling of face and throat  . A fast heartbeat  . A bad rash all over body  . Dizziness and weakness   Immunizations Administered    Name Date Dose VIS Date Route   Pfizer COVID-19 Vaccine 03/25/2019 10:06 AM 0.3 mL 01/01/2019 Intramuscular   Manufacturer: [place]   Lot: [id number]   [place]: [id number]

## 2019-04-03 ENCOUNTER — Other Ambulatory Visit: Payer: Self-pay | Admitting: Family Medicine

## 2019-04-14 ENCOUNTER — Encounter: Payer: Self-pay | Admitting: Family Medicine

## 2019-05-10 ENCOUNTER — Encounter: Payer: Self-pay | Admitting: Family Medicine

## 2019-05-23 ENCOUNTER — Encounter: Payer: Self-pay | Admitting: Family Medicine

## 2019-05-24 ENCOUNTER — Other Ambulatory Visit: Payer: Self-pay

## 2019-05-24 MED ORDER — AMPHETAMINE-DEXTROAMPHETAMINE 30 MG PO TABS: 15.0000 mg | ORAL_TABLET | Freq: Two times a day (BID) | ORAL | 0 refills | Status: DC

## 2019-05-24 NOTE — Telephone Encounter (Signed)
Pt requesting refill Rx Adderall

## 2019-05-24 NOTE — Progress Notes (Signed)
I refilled this 

## 2019-06-02 ENCOUNTER — Encounter: Payer: Self-pay | Admitting: Family Medicine

## 2019-06-03 ENCOUNTER — Other Ambulatory Visit: Payer: Self-pay

## 2019-07-04 ENCOUNTER — Encounter: Payer: Self-pay | Admitting: Family Medicine

## 2019-07-05 ENCOUNTER — Other Ambulatory Visit: Payer: Self-pay

## 2019-07-05 MED ORDER — LOSARTAN POTASSIUM 50 MG PO TABS: 50.0000 mg | ORAL_TABLET | Freq: Every day | ORAL | 1 refills | Status: DC

## 2019-07-07 ENCOUNTER — Other Ambulatory Visit (INDEPENDENT_AMBULATORY_CARE_PROVIDER_SITE_OTHER): Payer: PPO

## 2019-07-07 ENCOUNTER — Other Ambulatory Visit: Payer: Self-pay

## 2019-07-07 DIAGNOSIS — E785 Hyperlipidemia, unspecified: Secondary | ICD-10-CM

## 2019-07-07 DIAGNOSIS — R739 Hyperglycemia, unspecified: Secondary | ICD-10-CM

## 2019-07-07 DIAGNOSIS — E039 Hypothyroidism, unspecified: Secondary | ICD-10-CM | POA: Diagnosis not present

## 2019-07-07 LAB — COMPREHENSIVE METABOLIC PANEL
ALT: 41 U/L (ref 0–53)
AST: 46 U/L — ABNORMAL HIGH (ref 0–37)
Albumin: 4.3 g/dL (ref 3.5–5.2)
Alkaline Phosphatase: 51 U/L (ref 39–117)
BUN: 18 mg/dL (ref 6–23)
CO2: 33 mEq/L — ABNORMAL HIGH (ref 19–32)
Calcium: 8.9 mg/dL (ref 8.4–10.5)
Chloride: 101 mEq/L (ref 96–112)
Creatinine, Ser: 0.88 mg/dL (ref 0.40–1.50)
GFR: 84.46 mL/min (ref >60.00)
Glucose, Bld: 95 mg/dL (ref 70–99)
Potassium: 4.1 mEq/L (ref 3.5–5.1)
Sodium: 137 mEq/L (ref 135–145)
Total Bilirubin: 0.9 mg/dL (ref 0.2–1.2)
Total Protein: 6.5 g/dL (ref 6.0–8.3)

## 2019-07-07 LAB — LIPID PANEL
Cholesterol: 147 mg/dL (ref 0–200)
HDL: 49.9 mg/dL (ref >39.00)
LDL Cholesterol: 86 mg/dL (ref 0–99)
NonHDL: 97.16
Total CHOL/HDL Ratio: 3
Triglycerides: 55 mg/dL (ref 0.0–149.0)
VLDL: 11 mg/dL (ref 0.0–40.0)

## 2019-07-07 LAB — HEMOGLOBIN A1C: Hgb A1c MFr Bld: 6 % (ref 4.6–6.5)

## 2019-07-07 LAB — TSH: TSH: 0.98 u[IU]/mL (ref 0.35–4.50)

## 2019-07-08 ENCOUNTER — Encounter: Payer: Self-pay | Admitting: Family Medicine

## 2019-07-09 ENCOUNTER — Other Ambulatory Visit: Payer: Self-pay

## 2019-07-09 ENCOUNTER — Encounter: Payer: Self-pay | Admitting: Family Medicine

## 2019-07-09 ENCOUNTER — Ambulatory Visit (INDEPENDENT_AMBULATORY_CARE_PROVIDER_SITE_OTHER): Payer: PPO | Admitting: Family Medicine

## 2019-07-09 VITALS — BP 134/62 | HR 64 | Temp 98.3°F | Ht 68.5 in | Wt 174.8 lb

## 2019-07-09 DIAGNOSIS — F988 Other specified behavioral and emotional disorders with onset usually occurring in childhood and adolescence: Secondary | ICD-10-CM | POA: Diagnosis not present

## 2019-07-09 DIAGNOSIS — F429 Obsessive-compulsive disorder, unspecified: Secondary | ICD-10-CM

## 2019-07-09 DIAGNOSIS — I10 Essential (primary) hypertension: Secondary | ICD-10-CM | POA: Diagnosis not present

## 2019-07-09 DIAGNOSIS — E785 Hyperlipidemia, unspecified: Secondary | ICD-10-CM | POA: Diagnosis not present

## 2019-07-09 DIAGNOSIS — E039 Hypothyroidism, unspecified: Secondary | ICD-10-CM

## 2019-07-09 DIAGNOSIS — R739 Hyperglycemia, unspecified: Secondary | ICD-10-CM | POA: Diagnosis not present

## 2019-07-09 MED ORDER — AMPHETAMINE-DEXTROAMPHETAMINE 30 MG PO TABS: 15.0000 mg | ORAL_TABLET | Freq: Two times a day (BID) | ORAL | 0 refills | Status: DC

## 2019-07-09 NOTE — Progress Notes (Signed)
Phone [phone] In person visit   Subjective:   Darius Long. is a 74 y.o. year old very pleasant male patient who presents for/with See problem oriented charting Chief Complaint  Patient presents with  . Hypertension  . Hyperlipidemia    This visit occurred during the SARS-CoV-2 public health emergency.  Safety protocols were in place, including screening questions prior to the visit, additional usage of staff PPE, and extensive cleaning of exam room while observing appropriate contact time as indicated for disinfecting solutions.   Past Medical History-  Patient Active Problem List   Diagnosis Date Noted  . OCD (obsessive compulsive disorder) 01/01/2018    Priority: High  . Attention deficit disorder (ADD) in adult     Priority: High  . Heart murmur     Priority: High  . Arthritis 07/07/2017    Priority: Medium  . Hypertension     Priority: Medium  . Hyperlipidemia     Priority: Medium  . Hypothyroidism     Priority: Medium  . Hyperglycemia     Priority: Medium  . Strain of right tibialis anterior muscle 10/30/2017    Priority: Low  . Seasonal allergies     Priority: Low  . Colon polyp     Priority: Low    Medications- reviewed and updated Current Outpatient Medications  Medication Sig Dispense Refill  . amLODipine (NORVASC) 5 MG tablet Take 1 tablet (5 mg total) by mouth daily. 90 tablet 3  . amphetamine-dextroamphetamine (ADDERALL) 30 MG tablet Take 0.5 tablets by mouth 2 (two) times daily. 90 tablet 0  . atorvastatin (LIPITOR) 40 MG tablet Take 1 tablet (40 mg total) by mouth daily. 90 tablet 1  . calcium carbonate (TUMS - DOSED IN MG ELEMENTAL CALCIUM) 500 MG chewable tablet Chew 1 tablet by mouth daily.    . CHONDROITIN SULFATE PO Take 200 mg by mouth daily.    . DHA-EPA-GLA PO Take 690 mg by mouth daily.     . diclofenac Sodium (VOLTAREN) 1 % GEL APPLY TWO GRAM TOPICALLY EVERY NIGHT AT BEDTIME 100 g 1  . escitalopram (LEXAPRO) 10 MG tablet  Take 1 tablet (10 mg total) by mouth daily. 90 tablet 3  . Glucosamine HCl 1500 MG TABS Take 1,500 mg by mouth daily.    [name] Hyaluronic Acid-Vitamin C (HYALURONIC ACID PO) Take 3.3 mg by mouth daily.    . Investigational vitamin D 600 UNITS capsule SWOG [id number] Take 625 Units by mouth daily. Take with food.    [name] levothyroxine (SYNTHROID) 75 MCG tablet Take 1 tablet (75 mcg total) by mouth daily. 90 tablet 1  . losartan (COZAAR) 50 MG tablet Take 1 tablet (50 mg total) by mouth daily. 90 tablet 1  . metFORMIN (GLUCOPHAGE) 500 MG tablet Take 0.5 tablets (250 mg total) by mouth daily with breakfast. 45 tablet 3  . Misc Natural Products (TART CHERRY ADVANCED PO) Take 800 mg by mouth daily.    . Probiotic Product (PROBIOTIC ADVANCED PO) Take 1 tablet by mouth daily.    . vitamin E 400 UNIT capsule Take 800 Units by mouth daily.     No current facility-administered medications for this visit.     Objective:  BP 134/62   Pulse 64   Temp 98.3 F (36.8 C) (Temporal)   Ht 5' 8.5" (1.74 m)   Wt 174 lb 12.8 oz (79.3 kg)   SpO2 97%   BMI 26.19 kg/m  Gen: NAD, resting comfortably CV: RRR slight murmur  Lungs: CTAB no crackles, wheeze, rhonchi Abdomen: soft/nontender/nondistended/normal bowel sounds. No rebound or guarding.  Ext: no edema Skin: warm, dry     Assessment and Plan   #hyperlipidemia/elevated LFTs- ast hair high at 46 S: Medication: atorvastatin 40mg   Reports ast slightly high for years.   Alcohol- small glass of wine- about 2oz a day Lab Results  Component Value Date   CHOL 147 07/07/2019   HDL 49.90 07/07/2019   LDLCALC 86 07/07/2019   LDLDIRECT 89.0 08/18/2018   TRIG 55.0 07/07/2019   CHOLHDL 3 07/07/2019   A/P: mild elevations- work on lifestyle. Continue current meds With ast high- recommended no alcohol and repeat next visist  #hypothyroidism S: compliant On thyroid medication- levothyroxine 75 mcg Lab Results  Component Value Date   TSH 0.98 07/07/2019    A/P:Stable. Continue current medications.    #hypertension S: medication: reasonable control on losartan 50 mg and Amlodipine 5mg   -prior on hctz now off Home readings #s: average systolic 951.8 on home readings BP Readings from Last 3 Encounters:  07/09/19 134/62  02/02/19 136/64  12/01/18 (!) 142/74  A/P: Reasonable control in office today and average home diastolic is similar-continue current medications  # Hyperglycemia/insulin resistance/prediabetes S:  Medication: metformin 500mg - takes half tablet in AM Exercise and diet- prior to pandemic was workign at [employer]. Has daughter and kids coming to visit and he is active around the home now but not at [employer] like he used to be. Reasonably healthy diet Lab Results  Component Value Date   HGBA1C 6.0 07/07/2019   HGBA1C 5.9 11/27/2018   HGBA1C 6.3 08/18/2018  A/P: reasonable control- continue metformin 250mg   # OCD S:medication: compliant with lexapro 10mg . Allows him to handle stress A/P: reasonable control- continue current medicines   # ADD S:remains on adderall 30mg - half tablet twice daily. Good control.   A/P: Last refill was in May-he does not need at the moment but we went ahead and did a refill-discussed should be able to pick this up anytime between now and 6 months-if pharmacy will not fill at that time I am happy to do a refill again after PDMP review - nccsrs reviewed- only refills through [place]- refills every 3 months -UDS was 01/29/2019- reassuring - Dr. Blane Ohara of psychology diagnosed - Controlled substance contract signed 10/23/17  # high co2- doesn't meet minimum criteria for cpap but could still have mild issues causing slightly high co2  Recommended follow up: January 13th or later for physical Future Appointments  Date Time Provider [place]  08/05/2019  9:40 AM [name], MD [facility]  09/14/2019  9:15 AM Hayden Pedro, MD [facility] None    Lab/Order associations:   ICD-10-CM   1.  Attention deficit disorder (ADD) in adult  F98.8   2. Essential hypertension  I10   3. Hyperlipidemia, unspecified hyperlipidemia type  E78.5   4. Hypothyroidism, unspecified type  E03.9   5. Obsessive-compulsive disorder, unspecified type  F42.9     Meds ordered this encounter  Medications  . amphetamine-dextroamphetamine (ADDERALL) 30 MG tablet    Sig: Take 0.5 tablets by mouth 2 (two) times daily.    Dispense:  90 tablet    Refill:  0   Return precautions advised.  [name], MD

## 2019-07-09 NOTE — Telephone Encounter (Signed)
Can you keep him in mind if something comes open

## 2019-07-09 NOTE — Patient Instructions (Addendum)
No changes today  Recommended follow up: January 13th or later for physical. Schedule labs a few days before.   For team- phq2  Glad you are doing well!

## 2019-07-13 ENCOUNTER — Other Ambulatory Visit: Payer: Self-pay | Admitting: Family Medicine

## 2019-08-02 ENCOUNTER — Ambulatory Visit: Payer: PPO | Admitting: Family Medicine

## 2019-08-05 ENCOUNTER — Ambulatory Visit: Payer: PPO | Admitting: Family Medicine

## 2019-08-13 ENCOUNTER — Encounter: Payer: Self-pay | Admitting: Family Medicine

## 2019-08-13 MED ORDER — METFORMIN HCL 500 MG PO TABS: 250.0000 mg | ORAL_TABLET | Freq: Every day | ORAL | 3 refills | Status: DC

## 2019-08-30 ENCOUNTER — Other Ambulatory Visit: Payer: Self-pay | Admitting: Family Medicine

## 2019-09-13 ENCOUNTER — Encounter: Payer: Self-pay | Admitting: Family Medicine

## 2019-09-14 ENCOUNTER — Encounter (INDEPENDENT_AMBULATORY_CARE_PROVIDER_SITE_OTHER): Payer: PPO | Admitting: Ophthalmology

## 2019-09-14 ENCOUNTER — Other Ambulatory Visit: Payer: Self-pay

## 2019-09-14 DIAGNOSIS — I10 Essential (primary) hypertension: Secondary | ICD-10-CM | POA: Diagnosis not present

## 2019-09-14 DIAGNOSIS — H35033 Hypertensive retinopathy, bilateral: Secondary | ICD-10-CM

## 2019-09-14 DIAGNOSIS — D229 Melanocytic nevi, unspecified: Secondary | ICD-10-CM

## 2019-09-14 DIAGNOSIS — H33301 Unspecified retinal break, right eye: Secondary | ICD-10-CM

## 2019-09-14 DIAGNOSIS — H43813 Vitreous degeneration, bilateral: Secondary | ICD-10-CM

## 2019-09-30 ENCOUNTER — Other Ambulatory Visit: Payer: Self-pay | Admitting: Family Medicine

## 2019-10-13 ENCOUNTER — Encounter: Payer: Self-pay | Admitting: Family Medicine

## 2019-10-13 ENCOUNTER — Ambulatory Visit (INDEPENDENT_AMBULATORY_CARE_PROVIDER_SITE_OTHER): Payer: PPO

## 2019-10-13 ENCOUNTER — Other Ambulatory Visit: Payer: Self-pay

## 2019-10-13 DIAGNOSIS — Z23 Encounter for immunization: Secondary | ICD-10-CM | POA: Diagnosis not present

## 2019-10-17 ENCOUNTER — Other Ambulatory Visit: Payer: Self-pay | Admitting: Family Medicine

## 2019-11-12 DIAGNOSIS — H532 Diplopia: Secondary | ICD-10-CM | POA: Diagnosis not present

## 2019-11-12 DIAGNOSIS — E119 Type 2 diabetes mellitus without complications: Secondary | ICD-10-CM | POA: Diagnosis not present

## 2019-11-12 DIAGNOSIS — H2513 Age-related nuclear cataract, bilateral: Secondary | ICD-10-CM | POA: Diagnosis not present

## 2019-11-12 DIAGNOSIS — H40013 Open angle with borderline findings, low risk, bilateral: Secondary | ICD-10-CM | POA: Diagnosis not present

## 2019-11-12 DIAGNOSIS — H43813 Vitreous degeneration, bilateral: Secondary | ICD-10-CM | POA: Diagnosis not present

## 2019-11-17 DIAGNOSIS — M9903 Segmental and somatic dysfunction of lumbar region: Secondary | ICD-10-CM | POA: Diagnosis not present

## 2019-11-17 DIAGNOSIS — Q667 Congenital pes cavus, unspecified foot: Secondary | ICD-10-CM | POA: Diagnosis not present

## 2019-11-17 DIAGNOSIS — M79672 Pain in left foot: Secondary | ICD-10-CM | POA: Diagnosis not present

## 2019-11-17 DIAGNOSIS — R531 Weakness: Secondary | ICD-10-CM | POA: Diagnosis not present

## 2019-11-17 DIAGNOSIS — M2021 Hallux rigidus, right foot: Secondary | ICD-10-CM | POA: Diagnosis not present

## 2019-11-17 DIAGNOSIS — M9906 Segmental and somatic dysfunction of lower extremity: Secondary | ICD-10-CM | POA: Diagnosis not present

## 2019-11-17 DIAGNOSIS — M19071 Primary osteoarthritis, right ankle and foot: Secondary | ICD-10-CM | POA: Diagnosis not present

## 2019-11-17 DIAGNOSIS — M25552 Pain in left hip: Secondary | ICD-10-CM | POA: Diagnosis not present

## 2019-11-17 DIAGNOSIS — M19072 Primary osteoarthritis, left ankle and foot: Secondary | ICD-10-CM | POA: Diagnosis not present

## 2019-11-17 DIAGNOSIS — M9905 Segmental and somatic dysfunction of pelvic region: Secondary | ICD-10-CM | POA: Diagnosis not present

## 2019-11-17 DIAGNOSIS — M9904 Segmental and somatic dysfunction of sacral region: Secondary | ICD-10-CM | POA: Diagnosis not present

## 2019-11-19 ENCOUNTER — Encounter: Payer: Self-pay | Admitting: Family Medicine

## 2019-11-24 ENCOUNTER — Other Ambulatory Visit: Payer: Self-pay

## 2019-11-24 MED ORDER — HYDROCHLOROTHIAZIDE 12.5 MG PO CAPS: 12.5000 mg | ORAL_CAPSULE | Freq: Every day | ORAL | 3 refills | Status: DC

## 2019-11-29 ENCOUNTER — Encounter: Payer: Self-pay | Admitting: Family Medicine

## 2019-11-29 DIAGNOSIS — D1801 Hemangioma of skin and subcutaneous tissue: Secondary | ICD-10-CM | POA: Diagnosis not present

## 2019-11-29 DIAGNOSIS — L812 Freckles: Secondary | ICD-10-CM | POA: Diagnosis not present

## 2019-11-29 DIAGNOSIS — L821 Other seborrheic keratosis: Secondary | ICD-10-CM | POA: Diagnosis not present

## 2019-11-30 ENCOUNTER — Encounter: Payer: Self-pay | Admitting: Family Medicine

## 2019-11-30 MED ORDER — AMPHETAMINE-DEXTROAMPHETAMINE 30 MG PO TABS: 15.0000 mg | ORAL_TABLET | Freq: Two times a day (BID) | ORAL | 0 refills | Status: DC

## 2019-11-30 NOTE — Telephone Encounter (Signed)
LR: 07-09-2019 Qty: 79 w 0 refills  Last office visit: 07-09-2019 Upcoming appointment: No pending appt

## 2019-11-30 NOTE — Telephone Encounter (Signed)
Patient has a appt scheduled for 12-28-2019.

## 2019-12-01 DIAGNOSIS — M9903 Segmental and somatic dysfunction of lumbar region: Secondary | ICD-10-CM | POA: Diagnosis not present

## 2019-12-01 DIAGNOSIS — M9904 Segmental and somatic dysfunction of sacral region: Secondary | ICD-10-CM | POA: Diagnosis not present

## 2019-12-01 DIAGNOSIS — M19072 Primary osteoarthritis, left ankle and foot: Secondary | ICD-10-CM | POA: Diagnosis not present

## 2019-12-01 DIAGNOSIS — M25552 Pain in left hip: Secondary | ICD-10-CM | POA: Diagnosis not present

## 2019-12-01 DIAGNOSIS — M2021 Hallux rigidus, right foot: Secondary | ICD-10-CM | POA: Diagnosis not present

## 2019-12-01 DIAGNOSIS — M9905 Segmental and somatic dysfunction of pelvic region: Secondary | ICD-10-CM | POA: Diagnosis not present

## 2019-12-01 DIAGNOSIS — M19071 Primary osteoarthritis, right ankle and foot: Secondary | ICD-10-CM | POA: Diagnosis not present

## 2019-12-01 DIAGNOSIS — M9906 Segmental and somatic dysfunction of lower extremity: Secondary | ICD-10-CM | POA: Diagnosis not present

## 2019-12-01 DIAGNOSIS — R531 Weakness: Secondary | ICD-10-CM | POA: Diagnosis not present

## 2019-12-01 DIAGNOSIS — Q667 Congenital pes cavus, unspecified foot: Secondary | ICD-10-CM | POA: Diagnosis not present

## 2019-12-01 DIAGNOSIS — M79672 Pain in left foot: Secondary | ICD-10-CM | POA: Diagnosis not present

## 2019-12-18 ENCOUNTER — Other Ambulatory Visit: Payer: Self-pay | Admitting: Family Medicine

## 2019-12-20 ENCOUNTER — Encounter: Payer: Self-pay | Admitting: Family Medicine

## 2019-12-26 NOTE — Progress Notes (Signed)
Phone [phone] In person visit   Subjective:   Darius Long. is a 74 y.o. year old very pleasant male patient who presents for/with See problem oriented charting Chief Complaint  Patient presents with  . Weight Loss    pt is concerned that he's lost too much weight   . Blood Pressure Follow Up    pt states that he taskes his BP about 6 times in one sitting with 20 seconds inbetween each reading and then averages his BP out .   [name] Posture    Wife states that his posture is getting more and more "humped" over    This visit occurred during the SARS-CoV-2 public health emergency.  Safety protocols were in place, including screening questions prior to the visit, additional usage of staff PPE, and extensive cleaning of exam room while observing appropriate contact time as indicated for disinfecting solutions.   Past Medical History-  Patient Active Problem List   Diagnosis Date Noted  . OCD (obsessive compulsive disorder) 01/01/2018    Priority: High  . Attention deficit disorder (ADD) in adult     Priority: High  . Heart murmur     Priority: High  . Arthritis 07/07/2017    Priority: Medium  . Hypertension     Priority: Medium  . Hyperlipidemia     Priority: Medium  . Hypothyroidism     Priority: Medium  . Hyperglycemia     Priority: Medium  . Strain of right tibialis anterior muscle 10/30/2017    Priority: Low  . Seasonal allergies     Priority: Low  . Colon polyp     Priority: Low    Medications- reviewed and updated Current Outpatient Medications  Medication Sig Dispense Refill  . amphetamine-dextroamphetamine (ADDERALL) 30 MG tablet Take 0.5 tablets by mouth 2 (two) times daily. 90 tablet 0  . atorvastatin (LIPITOR) 40 MG tablet TAKE ONE TABLET BY MOUTH DAILY 90 tablet 1  . bimatoprost (LUMIGAN) 0.01 % SOLN 1 drop at bedtime.    . calcium carbonate (TUMS - DOSED IN MG ELEMENTAL CALCIUM) 500 MG chewable tablet Chew 1 tablet by mouth daily.    .  CHONDROITIN SULFATE PO Take 200 mg by mouth daily.    . DHA-EPA-GLA PO Take 690 mg by mouth daily.     . diclofenac Sodium (VOLTAREN) 1 % GEL APPLY 2 GRAMS TO AFFECTED AREA(S) TOPICALLY EVERY NIGHT AT BEDTIME 100 g 2  . escitalopram (LEXAPRO) 10 MG tablet Take 1 tablet (10 mg total) by mouth daily. 90 tablet 3  . Glucosamine HCl 1500 MG TABS Take 1,500 mg by mouth daily.    [name] Hyaluronic Acid-Vitamin C (HYALURONIC ACID PO) Take 3.3 mg by mouth daily.    . hydrochlorothiazide (MICROZIDE) 12.5 MG capsule Take 1 capsule (12.5 mg total) by mouth daily. 90 capsule 3  . Investigational vitamin D 600 UNITS capsule SWOG S0812 Take 625 Units by mouth daily. Take with food.    [name] Kitchen levothyroxine (SYNTHROID) 75 MCG tablet TAKE ONE TABLET BY MOUTH DAILY 90 tablet 1  . losartan (COZAAR) 100 MG tablet Take 1 tablet (100 mg total) by mouth daily. 90 tablet 3  . Misc Natural Products (TART CHERRY ADVANCED PO) Take 800 mg by mouth daily.    . Probiotic Product (PROBIOTIC ADVANCED PO) Take 1 tablet by mouth daily.    . vitamin E 400 UNIT capsule Take 800 Units by mouth daily.     No current facility-administered medications for this visit.  Objective:  BP (!) 152/70   Pulse 80   Temp 98.7 F (37.1 C) (Temporal)   Ht 5\' 9"  (1.753 m)   Wt 154 lb 12.8 oz (70.2 kg)   SpO2 99%   BMI 22.86 kg/m  Gen: NAD, resting comfortably CV: RRR no murmurs rubs or gallops Lungs: CTAB no crackles, wheeze, rhonchi Abdomen: soft/nontender/nondistended/normal bowel sounds. No rebound or guarding.  Ext: no edema Skin: warm, dry Neuro: grossly normal, moves all extremities, normal gait    Assessment and Plan   #hypertension S: medication: Losartan 50Mg , hctz 12.5 mg -remains off amlodipine (urination was not better on this). Continued frequency issues but wonders if aches and pains that he wakes up with are whats causing this- then he just gets up and goes. States overall better from august.  Home readings #s: average  systolic in last 25 days was 139.9. then in last week has wbeen 141.9 BP Readings from Last 3 Encounters:  12/28/19 (!) 152/70  07/09/19 134/62  02/02/19 136/64  A/P: blood pressure trending up at home and higher in office today on losartan 50mg  and hctz 12.5mg . had been better on amlodipine instead of hctz but we opted to try losartan 100mg  and continue hctz 12.5 mg and see if that helps. Follow up in approximately a month  #hyperlipidemia- last ast slightly high at 46 (intermittent issues for years) S: Medication: atorvastatin 40mg   Lab Results  Component Value Date   CHOL 147 07/07/2019   HDL 49.90 07/07/2019   LDLCALC 86 07/07/2019   LDLDIRECT 89.0 08/18/2018   TRIG 55.0 07/07/2019   CHOLHDL 3 07/07/2019   A/P: lipids reasonably well controlled on atorvastatin 40 mg-  - we have recommended no alcohol and recheck- continues small 2 oz of wine daily Lab Results  Component Value Date   ALT 41 07/07/2019   AST 46 (H) 07/07/2019   ALKPHOS 51 07/07/2019   BILITOT 0.9 07/07/2019   #hypothyroidism S: compliant On thyroid medication- levothyroxine  75 mcg takes around 2 am when wakes up to pee Lab Results  Component Value Date   TSH 0.98 07/07/2019   A/P:hopefully stable- update tsh- worried about this with weight loss   # Hyperglycemia/insulin resistance/prediabetes S:  Medication: metformin 500mg  - takes just half tablet in AM Lab Results  Component Value Date   HGBA1C 6.0 07/07/2019   HGBA1C 5.9 11/27/2018   HGBA1C 6.3 08/18/2018   A/P: reasonable control- update a1c. With weight loss stop metformin- would readjust this plan if a1c very high and cause of weight loss.   #OCD- compliant with lexapro 10mg   # adult add S:on adderall 30mg  half tablet twice dialy with reasonable control.   A/P:  Reasonable control but I am concerned abotu weight loss  - nccsrs reviewed- only refills through [place] every 6 months -uds 01/29/19 reassuring -Dr. Blane Ohara of psychology original  diagnosis -controlledsusbstance contract signed 10/23/17  # unintentional weight loss S:down 20 lbs in 6 months on our scales. Had not been below 160 since HS. At first thought weight loss was caused by his activity level but then went out of state and this continued to progress.   Wife reports he is eating reasonably healthy - has loosened up diet and is trying boost. Doing some scrambled eggs in diet. Has non fat frozen yogurt in evening with caramel or chocolate so certainly getting more calories. Drinking skim milk with high protein Wt Readings from Last 3 Encounters:  12/28/19 154 lb 12.8 oz (70.2 kg)  07/09/19 174 lb 12.8 oz (79.3 kg)  02/02/19 179 lb (81.2 kg)  A/P: concerning weight loss 20 lbs in 6 months. workup per labs below. Stop adderall and metformin  And recheck 1 month. Depending on labs may need further workup.   # wife is concerned about posture- offered dexa but not sure if this is covered under bad posture- prior bone density in 2015. In the end- preferred to not check after discussion   Recommended follow up: Return in about 1 month (around 01/28/2020) for follow up- or sooner if needed. Future Appointments  Date Time Provider [place]  02/07/2020  8:15 AM [facility]  02/11/2020  8:20 AM [name], MD [facility]  08/31/2020  9:15 AM Hayden Pedro, MD [facility] None   Lab/Order associations: skipped lunch   ICD-10-CM   1. Primary hypertension  I10 CBC with Differential/Platelet    Comprehensive metabolic panel  2. Unintentional weight loss  R63.4 CBC with Differential/Platelet    Comprehensive metabolic panel    Hemoglobin A1c    TSH    Fecal occult blood, imunochemical    HIV Antibody (routine testing w rflx)    Hepatitis C antibody    DG Chest 2 View    Sedimentation rate    C-reactive protein    POCT Urinalysis Dipstick (Automated)  3. Hyperglycemia  R73.9 Hemoglobin A1c  4. Hypothyroidism, unspecified type  E03.9 TSH  5.  Nocturia  R35.1 POCT Urinalysis Dipstick (Automated)  6. Bad posture  R29.3 CANCELED: DG Bone Density    Meds ordered this encounter  Medications  . losartan (COZAAR) 100 MG tablet    Sig: Take 1 tablet (100 mg total) by mouth daily.    Dispense:  90 tablet    Refill:  3   Return precautions advised.  [name], MD

## 2019-12-27 ENCOUNTER — Encounter: Payer: Self-pay | Admitting: Family Medicine

## 2019-12-27 DIAGNOSIS — H2513 Age-related nuclear cataract, bilateral: Secondary | ICD-10-CM | POA: Diagnosis not present

## 2019-12-27 DIAGNOSIS — H532 Diplopia: Secondary | ICD-10-CM | POA: Diagnosis not present

## 2019-12-27 DIAGNOSIS — H43813 Vitreous degeneration, bilateral: Secondary | ICD-10-CM | POA: Diagnosis not present

## 2019-12-27 DIAGNOSIS — E119 Type 2 diabetes mellitus without complications: Secondary | ICD-10-CM | POA: Diagnosis not present

## 2019-12-27 DIAGNOSIS — H40013 Open angle with borderline findings, low risk, bilateral: Secondary | ICD-10-CM | POA: Diagnosis not present

## 2019-12-28 ENCOUNTER — Other Ambulatory Visit: Payer: Self-pay

## 2019-12-28 ENCOUNTER — Encounter: Payer: Self-pay | Admitting: Family Medicine

## 2019-12-28 ENCOUNTER — Ambulatory Visit (INDEPENDENT_AMBULATORY_CARE_PROVIDER_SITE_OTHER): Payer: PPO | Admitting: Family Medicine

## 2019-12-28 VITALS — BP 144/74 | HR 80 | Temp 98.7°F | Ht 69.0 in | Wt 154.8 lb

## 2019-12-28 DIAGNOSIS — R634 Abnormal weight loss: Secondary | ICD-10-CM

## 2019-12-28 DIAGNOSIS — R739 Hyperglycemia, unspecified: Secondary | ICD-10-CM | POA: Diagnosis not present

## 2019-12-28 DIAGNOSIS — R351 Nocturia: Secondary | ICD-10-CM

## 2019-12-28 DIAGNOSIS — E039 Hypothyroidism, unspecified: Secondary | ICD-10-CM

## 2019-12-28 DIAGNOSIS — I10 Essential (primary) hypertension: Secondary | ICD-10-CM

## 2019-12-28 DIAGNOSIS — R293 Abnormal posture: Secondary | ICD-10-CM

## 2019-12-28 LAB — POC URINALSYSI DIPSTICK (AUTOMATED)
Bilirubin, UA: NEGATIVE
Blood, UA: NEGATIVE
Glucose, UA: NEGATIVE
Ketones, UA: NEGATIVE
Leukocytes, UA: NEGATIVE
Nitrite, UA: NEGATIVE
Protein, UA: NEGATIVE
Spec Grav, UA: 1.02 (ref 1.010–1.025)
Urobilinogen, UA: 0.2 E.U./dL
pH, UA: 6 (ref 5.0–8.0)

## 2019-12-28 MED ORDER — LOSARTAN POTASSIUM 100 MG PO TABS
100.0000 mg | ORAL_TABLET | Freq: Every day | ORAL | 3 refills | Status: DC
Start: 2019-12-28 — End: 2020-02-02

## 2019-12-28 NOTE — Patient Instructions (Addendum)
blood pressure trending up at home and higher in office today on losartan 50mg  and hctz 12.5mg . had been better on amlodipine instead of hctz but we opted to try losartan 100mg  and continue hctz 12.5 mg and see if that helps. Follow up in approximately a month  Stop metformin as associated with weight loss (we may change this plan if a1c very high and causing frequent urination)   Hold adderall for now though I am not taking off of med list yet.   Please stop by lab before you go- Team please process urine He also needs stool cards to bring back If you have mychart- we will send your results within 3 business days of [place] receiving them.  If you do not have mychart- we will call you about results within 5 business days of [place] receiving them.  *please note we are currently using [facility] which has a longer processing time than [place] typically so labs may not come back as quickly as in the past *please also note that you will see labs on mychart as soon as they post. I will later go in and write notes on them- will say "notes from Dr. Yong Channel"  Please go to [facility] (updated 03/18/2019) - located [address] across the street from [place] - in the basement - Hours: 8:30-5:00 PM M-F (with lunch from 12:30- 1 PM). You do NOT need an appointment.  - Please ensure you are covid symptom free before going in(No fever, chills, cough, congestion, runny nose, shortness of breath, fatigue, body aches, sore throat, headache, nausea, vomiting, diarrhea, or new loss of taste or smell. No known contacts with covid 19 or someone being tested for covid 19)  Recommended follow up: Return in about 1 month (around 01/28/2020) for follow up- or sooner if needed.  If blood work and workup unremarkable but continue to lose weight despite loosening diet and doing boost one or two times a day we may need to do further workup.

## 2019-12-28 NOTE — Addendum Note (Signed)
Addended by: Thomes Cake on: 12/28/2019 04:49 PM   Modules accepted: Orders

## 2019-12-29 ENCOUNTER — Other Ambulatory Visit: Payer: Self-pay | Admitting: Family Medicine

## 2019-12-29 ENCOUNTER — Ambulatory Visit (INDEPENDENT_AMBULATORY_CARE_PROVIDER_SITE_OTHER)
Admission: RE | Admit: 2019-12-29 | Discharge: 2019-12-29 | Disposition: A | Discharge status: Home / Self Care | Payer: PPO | Source: Ambulatory Visit | Attending: Family Medicine | Admitting: Family Medicine

## 2019-12-29 DIAGNOSIS — M9904 Segmental and somatic dysfunction of sacral region: Secondary | ICD-10-CM | POA: Diagnosis not present

## 2019-12-29 DIAGNOSIS — M19072 Primary osteoarthritis, left ankle and foot: Secondary | ICD-10-CM | POA: Diagnosis not present

## 2019-12-29 DIAGNOSIS — M25552 Pain in left hip: Secondary | ICD-10-CM | POA: Diagnosis not present

## 2019-12-29 DIAGNOSIS — Q667 Congenital pes cavus, unspecified foot: Secondary | ICD-10-CM | POA: Diagnosis not present

## 2019-12-29 DIAGNOSIS — R634 Abnormal weight loss: Secondary | ICD-10-CM

## 2019-12-29 DIAGNOSIS — M9903 Segmental and somatic dysfunction of lumbar region: Secondary | ICD-10-CM | POA: Diagnosis not present

## 2019-12-29 DIAGNOSIS — J9811 Atelectasis: Secondary | ICD-10-CM | POA: Diagnosis not present

## 2019-12-29 DIAGNOSIS — M9905 Segmental and somatic dysfunction of pelvic region: Secondary | ICD-10-CM | POA: Diagnosis not present

## 2019-12-29 DIAGNOSIS — M9906 Segmental and somatic dysfunction of lower extremity: Secondary | ICD-10-CM | POA: Diagnosis not present

## 2019-12-29 DIAGNOSIS — M79672 Pain in left foot: Secondary | ICD-10-CM | POA: Diagnosis not present

## 2019-12-29 DIAGNOSIS — R531 Weakness: Secondary | ICD-10-CM | POA: Diagnosis not present

## 2019-12-29 LAB — HEMOGLOBIN A1C
Hgb A1c MFr Bld: 5.7 % of total Hgb — ABNORMAL HIGH (ref <5.7)
Mean Plasma Glucose: 117 mg/dL
eAG (mmol/L): 6.5 mmol/L

## 2019-12-29 LAB — CBC WITH DIFFERENTIAL/PLATELET
Absolute Monocytes: 708 cells/uL (ref 200–950)
Basophils Absolute: 18 cells/uL (ref 0–200)
Basophils Relative: 0.3 %
Eosinophils Absolute: 159 cells/uL (ref 15–500)
Eosinophils Relative: 2.7 %
HCT: 43.1 % (ref 38.5–50.0)
Hemoglobin: 14.3 g/dL (ref 13.2–17.1)
Lymphs Abs: 2614 cells/uL (ref 850–3900)
MCH: 28.8 pg (ref 27.0–33.0)
MCHC: 33.2 g/dL (ref 32.0–36.0)
MCV: 86.9 fL (ref 80.0–100.0)
MPV: 11 fL (ref 7.5–12.5)
Monocytes Relative: 12 %
Neutro Abs: 2401 cells/uL (ref 1500–7800)
Neutrophils Relative %: 40.7 %
Platelets: 268 10*3/uL (ref 140–400)
RBC: 4.96 10*6/uL (ref 4.20–5.80)
RDW: 12.5 % (ref 11.0–15.0)
Total Lymphocyte: 44.3 %
WBC: 5.9 10*3/uL (ref 3.8–10.8)

## 2019-12-29 LAB — COMPREHENSIVE METABOLIC PANEL
AG Ratio: 1.6 (calc) (ref 1.0–2.5)
ALT: 51 U/L — ABNORMAL HIGH (ref 9–46)
AST: 38 U/L — ABNORMAL HIGH (ref 10–35)
Albumin: 4 g/dL (ref 3.6–5.1)
Alkaline phosphatase (APISO): 72 U/L (ref 35–144)
BUN/Creatinine Ratio: 35 (calc) — ABNORMAL HIGH (ref 6–22)
BUN: 23 mg/dL (ref 7–25)
CO2: 32 mmol/L (ref 20–32)
Calcium: 9.3 mg/dL (ref 8.6–10.3)
Chloride: 99 mmol/L (ref 98–110)
Creat: 0.65 mg/dL — ABNORMAL LOW (ref 0.70–1.18)
Globulin: 2.5 g/dL (calc) (ref 1.9–3.7)
Glucose, Bld: 95 mg/dL (ref 65–99)
Potassium: 4.1 mmol/L (ref 3.5–5.3)
Sodium: 138 mmol/L (ref 135–146)
Total Bilirubin: 1 mg/dL (ref 0.2–1.2)
Total Protein: 6.5 g/dL (ref 6.1–8.1)

## 2019-12-29 LAB — SEDIMENTATION RATE: Sed Rate: 9 mm/h (ref 0–20)

## 2019-12-29 LAB — TSH: TSH: <0.01 mIU/L — ABNORMAL LOW (ref 0.40–4.50)

## 2019-12-29 LAB — HIV ANTIBODY (ROUTINE TESTING W REFLEX): HIV 1&2 Ab, 4th Generation: NONREACTIVE

## 2019-12-29 LAB — HEPATITIS C ANTIBODY
Hepatitis C Ab: NONREACTIVE
SIGNAL TO CUT-OFF: 0.04 (ref <1.00)

## 2019-12-29 LAB — C-REACTIVE PROTEIN: CRP: 1.6 mg/L (ref <8.0)

## 2019-12-30 ENCOUNTER — Telehealth: Payer: Self-pay

## 2019-12-30 NOTE — Telephone Encounter (Signed)
Patient is returning a call from [place].  

## 2019-12-31 ENCOUNTER — Encounter: Payer: Self-pay | Admitting: Family Medicine

## 2019-12-31 ENCOUNTER — Other Ambulatory Visit (INDEPENDENT_AMBULATORY_CARE_PROVIDER_SITE_OTHER): Payer: PPO

## 2019-12-31 DIAGNOSIS — R634 Abnormal weight loss: Secondary | ICD-10-CM | POA: Diagnosis not present

## 2019-12-31 LAB — FECAL OCCULT BLOOD, IMMUNOCHEMICAL: Fecal Occult Bld: NEGATIVE

## 2019-12-31 NOTE — Telephone Encounter (Signed)
Pt returning call

## 2020-01-01 ENCOUNTER — Encounter: Payer: Self-pay | Admitting: Family Medicine

## 2020-01-02 ENCOUNTER — Encounter: Payer: Self-pay | Admitting: Family Medicine

## 2020-01-03 ENCOUNTER — Telehealth: Payer: Self-pay

## 2020-01-03 ENCOUNTER — Other Ambulatory Visit: Payer: Self-pay

## 2020-01-03 MED ORDER — LEVOTHYROXINE SODIUM 50 MCG PO TABS: 50.0000 ug | ORAL_TABLET | Freq: Every day | ORAL | 5 refills | Status: DC

## 2020-01-03 NOTE — Telephone Encounter (Signed)
Rx sent 

## 2020-01-03 NOTE — Telephone Encounter (Signed)
MEDICATION: 30mcg levothyroxine   PHARMACY:  [name] [address], [place] Phone:  [phone]  Fax:  [phone]       Comments: Patient states that it should be sent in for 30 days.     **Let patient know to contact pharmacy at the end of the day to make sure medication is ready. **  ** Please notify patient to allow 48-72 hours to process**  **Encourage patient to contact the pharmacy for refills or they can request refills through [facility]**

## 2020-01-05 ENCOUNTER — Telehealth: Payer: Self-pay | Admitting: Family Medicine

## 2020-01-05 NOTE — Chronic Care Management (AMB) (Signed)
°  Chronic Care Management   Note  01/05/2020 Name: Darius Long. MRN: [id number] DOB: [birth year]  Darius Long. is a 74 y.o. year old male who is a primary care patient of [name], MD. I reached out to Aundria Mems. by phone today in response to a referral sent by [name]'s PCP, Yong Channel Brayton Mars, MD.   Darius Long was given information about Chronic Care Management services today including:  1. CCM service includes personalized support from designated clinical staff supervised by his physician, including individualized plan of care and coordination with other care providers 2. 24/7 contact phone numbers for assistance for urgent and routine care needs. 3. Service will only be billed when office clinical staff spend 20 minutes or more in a month to coordinate care. 4. Only one practitioner may furnish and bill the service in a calendar month. 5. The patient may stop CCM services at any time (effective at the end of the month) by phone call to the office staff.   Patient agreed to services and verbal consent obtained.   Follow up plan:   Lauretta Grill [employer] Scheduler

## 2020-01-06 ENCOUNTER — Encounter: Payer: Self-pay | Admitting: Family Medicine

## 2020-01-10 ENCOUNTER — Ambulatory Visit
Admission: RE | Admit: 2020-01-10 | Discharge: 2020-01-10 | Disposition: A | Discharge status: Home / Self Care | Payer: PPO | Source: Ambulatory Visit | Attending: Sports Medicine | Admitting: Sports Medicine

## 2020-01-10 ENCOUNTER — Other Ambulatory Visit: Payer: Self-pay | Admitting: Sports Medicine

## 2020-01-10 DIAGNOSIS — M79672 Pain in left foot: Secondary | ICD-10-CM

## 2020-01-13 ENCOUNTER — Other Ambulatory Visit: Payer: Self-pay | Admitting: Family Medicine

## 2020-01-18 NOTE — Telephone Encounter (Signed)
Called patient and changed all his appts.

## 2020-01-24 ENCOUNTER — Other Ambulatory Visit: Payer: PPO

## 2020-01-24 DIAGNOSIS — Z20822 Contact with and (suspected) exposure to covid-19: Secondary | ICD-10-CM | POA: Diagnosis not present

## 2020-01-25 LAB — NOVEL CORONAVIRUS, NAA: SARS-CoV-2, NAA: NOT DETECTED

## 2020-01-25 LAB — SARS-COV-2, NAA 2 DAY TAT

## 2020-01-27 ENCOUNTER — Other Ambulatory Visit: Payer: Self-pay | Admitting: Family Medicine

## 2020-02-01 ENCOUNTER — Other Ambulatory Visit: Payer: Self-pay

## 2020-02-01 DIAGNOSIS — M9906 Segmental and somatic dysfunction of lower extremity: Secondary | ICD-10-CM | POA: Diagnosis not present

## 2020-02-01 DIAGNOSIS — M25552 Pain in left hip: Secondary | ICD-10-CM | POA: Diagnosis not present

## 2020-02-01 DIAGNOSIS — M79672 Pain in left foot: Secondary | ICD-10-CM | POA: Diagnosis not present

## 2020-02-01 DIAGNOSIS — M9903 Segmental and somatic dysfunction of lumbar region: Secondary | ICD-10-CM | POA: Diagnosis not present

## 2020-02-01 DIAGNOSIS — M9904 Segmental and somatic dysfunction of sacral region: Secondary | ICD-10-CM | POA: Diagnosis not present

## 2020-02-01 DIAGNOSIS — H2513 Age-related nuclear cataract, bilateral: Secondary | ICD-10-CM | POA: Diagnosis not present

## 2020-02-01 DIAGNOSIS — H43813 Vitreous degeneration, bilateral: Secondary | ICD-10-CM | POA: Diagnosis not present

## 2020-02-01 DIAGNOSIS — M19071 Primary osteoarthritis, right ankle and foot: Secondary | ICD-10-CM | POA: Diagnosis not present

## 2020-02-01 DIAGNOSIS — M2021 Hallux rigidus, right foot: Secondary | ICD-10-CM | POA: Diagnosis not present

## 2020-02-01 DIAGNOSIS — H40013 Open angle with borderline findings, low risk, bilateral: Secondary | ICD-10-CM | POA: Diagnosis not present

## 2020-02-01 DIAGNOSIS — M9905 Segmental and somatic dysfunction of pelvic region: Secondary | ICD-10-CM | POA: Diagnosis not present

## 2020-02-01 DIAGNOSIS — M19072 Primary osteoarthritis, left ankle and foot: Secondary | ICD-10-CM | POA: Diagnosis not present

## 2020-02-01 DIAGNOSIS — H532 Diplopia: Secondary | ICD-10-CM | POA: Diagnosis not present

## 2020-02-01 DIAGNOSIS — R531 Weakness: Secondary | ICD-10-CM | POA: Diagnosis not present

## 2020-02-01 NOTE — Patient Instructions (Addendum)
would prefer to get home blood pressure <135/85 at home. We are going to stop losartan 100mg  and start valsartan 320mg  and continue HCTZ.   Please stop by lab before you go If you have mychart- we will send your results within 3 business days of [place] receiving them.  If you do not have mychart- we will call you about results within 5 business days of [place] receiving them.  *please also note that you will see labs on mychart as soon as they post. I will later go in and write notes on them- will say "notes from Dr. Yong Channel"  Recommended follow up: Return in about 1 month (around 03/04/2020) for follow up- or sooner if needed. - schedule next available physical probably 4-5 months from now- after 07/07/19

## 2020-02-01 NOTE — Progress Notes (Signed)
Phone [phone] In person visit   Subjective:   Darius Long. is a 75 y.o. year old very pleasant male patient who presents for/with See problem oriented charting Chief Complaint  Patient presents with  . Hypertension   This visit occurred during the SARS-CoV-2 public health emergency.  Safety protocols were in place, including screening questions prior to the visit, additional usage of staff PPE, and extensive cleaning of exam room while observing appropriate contact time as indicated for disinfecting solutions.   Past Medical History-  Patient Active Problem List   Diagnosis Date Noted  . OCD (obsessive compulsive disorder) 01/01/2018    Priority: High  . Attention deficit disorder (ADD) in adult     Priority: High  . Heart murmur     Priority: High  . Arthritis 07/07/2017    Priority: Medium  . Hypertension     Priority: Medium  . Hyperlipidemia     Priority: Medium  . Hypothyroidism     Priority: Medium  . Hyperglycemia     Priority: Medium  . Strain of right tibialis anterior muscle 10/30/2017    Priority: Low  . Seasonal allergies     Priority: Low  . Colon polyp     Priority: Low    Medications- reviewed and updated Current Outpatient Medications  Medication Sig Dispense Refill  . amphetamine-dextroamphetamine (ADDERALL) 30 MG tablet Take 0.5 tablets by mouth 2 (two) times daily. 90 tablet 0  . atorvastatin (LIPITOR) 40 MG tablet TAKE ONE TABLET BY MOUTH DAILY 90 tablet 1  . bimatoprost (LUMIGAN) 0.01 % SOLN 1 drop at bedtime.    . calcium carbonate (TUMS - DOSED IN MG ELEMENTAL CALCIUM) 500 MG chewable tablet Chew 1 tablet by mouth daily.    . CHONDROITIN SULFATE PO Take 200 mg by mouth daily.    . DHA-EPA-GLA PO Take 690 mg by mouth daily.     . diclofenac Sodium (VOLTAREN) 1 % GEL APPLY 2 GRAMS TO THE AFFECTED AREA(S) EVERY NIGHT AT BEDTIME 100 g 2  . escitalopram (LEXAPRO) 10 MG tablet Take 1 tablet (10 mg total) by mouth daily. 90 tablet 3   . Glucosamine HCl 1500 MG TABS Take 1,500 mg by mouth daily.    [name] Kitchen Hyaluronic Acid-Vitamin C (HYALURONIC ACID PO) Take 3.3 mg by mouth daily.    . hydrochlorothiazide (MICROZIDE) 12.5 MG capsule Take 1 capsule (12.5 mg total) by mouth daily. 90 capsule 3  . Investigational vitamin D 600 UNITS capsule SWOG S0812 Take 625 Units by mouth daily. Take with food.    . latanoprost (XALATAN) 0.005 % ophthalmic solution Place 1 drop into both eyes at bedtime.    [name] Kitchen levothyroxine (SYNTHROID) 50 MCG tablet Take 1 tablet (50 mcg total) by mouth daily. 30 tablet 5  . Misc Natural Products (TART CHERRY ADVANCED PO) Take 800 mg by mouth daily.    . Probiotic Product (PROBIOTIC ADVANCED PO) Take 1 tablet by mouth daily.    . valsartan (DIOVAN) 320 MG tablet Take 1 tablet (320 mg total) by mouth daily. 90 tablet 3  . vitamin E 400 UNIT capsule Take 800 Units by mouth daily.     No current facility-administered medications for this visit.     Objective:  BP 138/80   Pulse 71   Temp 97.9 F (36.6 C) (Temporal)   Ht 5\' 9"  (1.753 m)   Wt 156 lb 3.2 oz (70.9 kg)   SpO2 96%   BMI 23.07 kg/m  Gen: NAD,  resting comfortably CV: RRR no murmurs rubs or gallops Lungs: CTAB no crackles, wheeze, rhonchi Ext: no edema Skin: warm, dry     Assessment and Plan   #hypertension S: medication: Losartan 100Mg , HCTZ 12.5Mg  -previously blood pressure seem to do better on amlodipine  -Adderall not ideal for blood pressure but has been off since last visit- he has been off since last visit and not noted a big difference though  Home readings #s: average of last 9 readings- which are an average of 3 checks he does at home.  avg 139.7375 63.975   BP Readings from Last 3 Encounters:  02/02/20 138/80  12/28/19 (!) 144/74  07/09/19 134/62  A/P: would prefer to get home blood pressure <135/85 at home. We are going to stop losartan 100mg  and start valsartan 320mg  and continue HCTZ.   # ADD S:patient previously on a  generic extended release adderall before he stopped work. That seemed to help more. He has been off IR adderall 30mg  half tablet twice daily and he has not noted significant worsening off of medication.  A/P: thrilled he is feeling ok off or medicine- would recommend remaining off of medicine.    #hyperlipidemia-last AST slightly high at 46-intermittent issues for years S: Medication:Atorvastatin 40 mg Lab Results  Component Value Date   CHOL 147 07/07/2019   HDL 49.90 07/07/2019   LDLCALC 86 07/07/2019   LDLDIRECT 89.0 08/18/2018   TRIG 55.0 07/07/2019   CHOLHDL 3 07/07/2019   A/P: Reasonable control on last check- ideally would be LDL under 70 but discussed at least would want 30% reduction from peak LDL- he will see if he can find old labs from his old notes (I do not have access)- unlikely to change dose as still on high dose.  - He does a 2 ounce glass of wine daily-we recommended no alcohol   #hypothyroidism S: compliant On thyroid medication-down to 50 mcg daily as overtreated last visit . May have contributed to weight loss Lab Results  Component Value Date   TSH <0.01 (L) 12/28/2019   A/P:hopefully improved- update TSH   # Hyperglycemia/insulin resistance/prediabetes S:  Medication: now off metformin. Metformin 500 mg-takes half tablet in the morning last visit Lab Results  Component Value Date   HGBA1C 5.7 (H) 12/28/2019   HGBA1C 6.0 07/07/2019   HGBA1C 5.9 11/27/2018    A/P: diet loosened and weight up some but we wanted to avoid weight loss- check a1c In 6 months- may or june   #Unintentional weight loss-Thankfully gained 2 pounds from last visit.  He had stopped Adderall and metformin.  We have discussed possible further work-up if not improving.  Previously down 20 pounds in 6 months. Continue to monitor.  Wt Readings from Last 3 Encounters:  02/02/20 156 lb 3.2 oz (70.9 kg)  12/28/19 154 lb 12.8 oz (70.2 kg)  07/09/19 174 lb 12.8 oz (79.3 kg)    Recommended  follow up: Return in about 1 month (around 03/04/2020) for follow up- or sooner if needed.  - schedule next available physical probably 4-5 months from now- after 07/07/19 Future Appointments  Date Time Provider [place]  02/03/2020  5:30 PM [facility] TESTING [facility] None  02/28/2020 10:30 AM [facility] CCM PHARMACIST [facility]  03/03/2020  2:30 PM [facility] HEALTH COACH [facility]  08/31/2020  9:15 AM Hayden Pedro, MD [facility] None   Lab/Order associations:   ICD-10-CM   1. Primary hypertension  I10   2. Hypothyroidism, unspecified type  E03.9 TSH  3. Hyperlipidemia, unspecified hyperlipidemia type  E78.5 Comprehensive metabolic panel    Meds ordered this encounter  Medications  . valsartan (DIOVAN) 320 MG tablet    Sig: Take 1 tablet (320 mg total) by mouth daily.    Dispense:  90 tablet    Refill:  3    Change from losartan 100mg     Return precautions advised.  , MD

## 2020-02-02 ENCOUNTER — Encounter: Payer: Self-pay | Admitting: Family Medicine

## 2020-02-02 ENCOUNTER — Ambulatory Visit (INDEPENDENT_AMBULATORY_CARE_PROVIDER_SITE_OTHER): Payer: PPO | Admitting: Family Medicine

## 2020-02-02 ENCOUNTER — Other Ambulatory Visit: Payer: Self-pay

## 2020-02-02 VITALS — BP 138/80 | HR 71 | Temp 97.9°F | Ht 69.0 in | Wt 156.2 lb

## 2020-02-02 DIAGNOSIS — R739 Hyperglycemia, unspecified: Secondary | ICD-10-CM

## 2020-02-02 DIAGNOSIS — F988 Other specified behavioral and emotional disorders with onset usually occurring in childhood and adolescence: Secondary | ICD-10-CM | POA: Diagnosis not present

## 2020-02-02 DIAGNOSIS — I10 Essential (primary) hypertension: Secondary | ICD-10-CM

## 2020-02-02 DIAGNOSIS — E785 Hyperlipidemia, unspecified: Secondary | ICD-10-CM | POA: Diagnosis not present

## 2020-02-02 DIAGNOSIS — E039 Hypothyroidism, unspecified: Secondary | ICD-10-CM

## 2020-02-02 DIAGNOSIS — F909 Attention-deficit hyperactivity disorder, unspecified type: Secondary | ICD-10-CM | POA: Diagnosis not present

## 2020-02-02 LAB — COMPREHENSIVE METABOLIC PANEL
ALT: 59 U/L — ABNORMAL HIGH (ref 0–53)
AST: 37 U/L (ref 0–37)
Albumin: 4 g/dL (ref 3.5–5.2)
Alkaline Phosphatase: 70 U/L (ref 39–117)
BUN: 23 mg/dL (ref 6–23)
CO2: 29 mEq/L (ref 19–32)
Calcium: 9.5 mg/dL (ref 8.4–10.5)
Chloride: 105 mEq/L (ref 96–112)
Creatinine, Ser: 0.64 mg/dL (ref 0.40–1.50)
GFR: 92.65 mL/min (ref >60.00)
Glucose, Bld: 99 mg/dL (ref 70–99)
Potassium: 4.2 mEq/L (ref 3.5–5.1)
Sodium: 138 mEq/L (ref 135–145)
Total Bilirubin: 0.9 mg/dL (ref 0.2–1.2)
Total Protein: 6.8 g/dL (ref 6.0–8.3)

## 2020-02-02 LAB — TSH: TSH: <0.01 u[IU]/mL — ABNORMAL LOW (ref 0.35–4.50)

## 2020-02-02 MED ORDER — VALSARTAN 320 MG PO TABS: 320.0000 mg | ORAL_TABLET | Freq: Every day | ORAL | 3 refills | Status: DC

## 2020-02-03 ENCOUNTER — Encounter: Payer: PPO | Admitting: Family Medicine

## 2020-02-03 ENCOUNTER — Other Ambulatory Visit: Payer: PPO

## 2020-02-03 DIAGNOSIS — Z20822 Contact with and (suspected) exposure to covid-19: Secondary | ICD-10-CM | POA: Diagnosis not present

## 2020-02-06 LAB — NOVEL CORONAVIRUS, NAA: SARS-CoV-2, NAA: NOT DETECTED

## 2020-02-07 ENCOUNTER — Other Ambulatory Visit: Payer: PPO

## 2020-02-07 ENCOUNTER — Telehealth: Payer: PPO

## 2020-02-11 ENCOUNTER — Ambulatory Visit: Payer: PPO

## 2020-02-11 ENCOUNTER — Encounter: Payer: PPO | Admitting: Family Medicine

## 2020-02-15 ENCOUNTER — Other Ambulatory Visit: Payer: Self-pay | Admitting: Family Medicine

## 2020-02-17 ENCOUNTER — Other Ambulatory Visit: Payer: Self-pay | Admitting: Family Medicine

## 2020-02-25 ENCOUNTER — Telehealth: Payer: Self-pay

## 2020-02-25 NOTE — Chronic Care Management (AMB) (Signed)
Chronic Care Management Pharmacy Assistant   Name: Darius Long.  MRN: [id number] DOB: December 01, 1945  Reason for Encounter: Medication Review / Initial Call  Darius Mems.,  75 y.o. , male presents for their Initial CCM visit with the clinical pharmacist via telephone due to COVID-19 Pandemic.  PCP : Darius Olp, MD  Allergies:  No Known Allergies  Medications: Outpatient Encounter Medications as of 02/25/2020  Medication Sig  . amphetamine-dextroamphetamine (ADDERALL) 30 MG tablet Take 0.5 tablets by mouth 2 (two) times daily.  [name] Kitchen atorvastatin (LIPITOR) 40 MG tablet TAKE ONE TABLET BY MOUTH DAILY  . bimatoprost (LUMIGAN) 0.01 % SOLN 1 drop at bedtime.  . calcium carbonate (TUMS - DOSED IN MG ELEMENTAL CALCIUM) 500 MG chewable tablet Chew 1 tablet by mouth daily.  . CHONDROITIN SULFATE PO Take 200 mg by mouth daily.  . DHA-EPA-GLA PO Take 690 mg by mouth daily.   . diclofenac Sodium (VOLTAREN) 1 % GEL APPLY 2 GRAMS TO THE AFFECTED AREA(S) EVERY NIGHT AT BEDTIME  . escitalopram (LEXAPRO) 10 MG tablet Take 1 tablet (10 mg total) by mouth daily.  . Glucosamine HCl 1500 MG TABS Take 1,500 mg by mouth daily.  [name] Kitchen Hyaluronic Acid-Vitamin C (HYALURONIC ACID PO) Take 3.3 mg by mouth daily.  . hydrochlorothiazide (MICROZIDE) 12.5 MG capsule Take 1 capsule (12.5 mg total) by mouth daily.  . Investigational vitamin D 600 UNITS capsule SWOG [id number] Take 625 Units by mouth daily. Take with food.  . latanoprost (XALATAN) 0.005 % ophthalmic solution Place 1 drop into both eyes at bedtime.  [name] Kitchen levothyroxine (SYNTHROID) 50 MCG tablet Take 1 tablet (50 mcg total) by mouth daily.  . Misc Natural Products (TART CHERRY ADVANCED PO) Take 800 mg by mouth daily.  . Probiotic Product (PROBIOTIC ADVANCED PO) Take 1 tablet by mouth daily.  . valsartan (DIOVAN) 320 MG tablet Take 1 tablet (320 mg total) by mouth daily.  . vitamin E 400 UNIT capsule Take 800 Units by mouth daily.   No  facility-administered encounter medications on file as of 02/25/2020.    Current Diagnosis: Patient Active Problem List   Diagnosis Date Noted  . OCD (obsessive compulsive disorder) 01/01/2018  . Strain of right tibialis anterior muscle 10/30/2017  . Arthritis 07/07/2017  . Hypertension   . Hyperlipidemia   . Hypothyroidism   . Hyperglycemia   . Attention deficit disorder (ADD) in adult   . Heart murmur   . Seasonal allergies   . Colon polyp     Have you seen any other providers since your last visit?   Patient states he recently saw Dr. Paulla Fore an orthopedic for his foot and hip issues. Patient states he saw Dr. Katy Fitch, his ophthalmologist on 02/01/2020 for an eye pressure reading.  Any changes in your medications or health?  Patient states he is currently holding on Levothyroxine due to low TSH levels and unintentional weight loss. Patient recently stopped taking Losartan 100 mg and start Valsartan 320 mg instead due to high blood pressure readings.  Any side effects from any medications?   Patient states he is not currently having any side effects from any of his medications.  Do you have any symptoms or problems not managed by your medications?  Patient states he has a lot of hip and foot pain. Patient states he occasionally uses Voltaren Gel for pain as needed.   Any concerns about your health right now?  Patient states his only concern is his  blood pressure. Patient states he would like for it to be better controlled than it is.  Has your provider asked that you check blood pressure, blood sugar, or follow special diet at home?  Patient states he is supposed to be checking his blood pressure at home but hasn't recently due to being out of town and not wanting to deal with checking his blood pressure while spending time with his daughter and grand kids. Patient is not asked to check his sugars at this time. Patient states Dr. Yong Channel doesn't advise a special diet at this time due to  unintentional weight loss.  Do you get any type of exercise on a regular basis?  Patient states he will go out for a walk occasionally with his wife. Patient states he and his wife go to a Pilates session once a  week. Patient states he used to go to the [facility] before [place].  Can you think of a goal you would like to reach for your health?  Patient states as a goal for his health he would like to have better blood pressure control. Patient states he would also like gain back some weight, ~15 lbs.  Do you have any problems getting your medications?  Patient states he does not have any problems getting his medications at this time.  Is there anything that you would like to discuss during the appointment?   Patient states he would like to discuss his supplements that he takes regularly. Patient states he would like to make sure none of his supplements contraindicate with any of his prescription medications.  Please have your medications and supplements available during your appointment.  April D Calhoun, [place] Pharmacist Assistant [phone]   Follow-Up:  Pharmacist Review

## 2020-02-25 NOTE — Progress Notes (Signed)
Chronic Care Management Pharmacy Note  02/28/2020 Name:  Darius Long. MRN:  [id number] DOB:  [birth year]  Subjective: Darius Long. is an 75 y.o. year old male who is a primary patient of Hunter, Brayton Mars, MD.  The CCM team was consulted for assistance with disease management and care coordination needs.    Engaged with patient by telephone for initial visit in response to provider referral for pharmacy case management and/or care coordination services.   Consent to Services:  The patient was given the following information about Chronic Care Management services today, agreed to services, and gave verbal consent: 1. CCM service includes personalized support from designated clinical staff supervised by the primary care provider, including individualized plan of care and coordination with other care providers 2. 24/7 contact phone numbers for assistance for urgent and routine care needs. 3. Service will only be billed when office clinical staff spend 20 minutes or more in a month to coordinate care. 4. Only one practitioner may furnish and bill the service in a calendar month. 5.The patient may stop CCM services at any time (effective at the end of the month) by phone call to the office staff. 6. The patient will be responsible for cost sharing (co-pay) of up to 20% of the service fee (after annual deductible is met). Patient agreed to services and consent obtained.  Patient Care Team: [name], MD as PCP - General (Family Medicine) [facility] as Consulting Physician Croitoru, Dani Gobble, MD as Consulting Physician (Cardiology) Hayden Pedro, MD as Consulting Physician (Ophthalmology) Madelin Rear, [facility] (Pharmacist) Madelin Rear, [facility] as Pharmacist (Pharmacist)  Objective:  Lab Results  Component Value Date   CREATININE 0.64 02/02/2020   BUN 23 02/02/2020   GFR 92.65 02/02/2020   NA 138 02/02/2020   K 4.2 02/02/2020   CALCIUM 9.5 02/02/2020    CO2 29 02/02/2020    Lab Results  Component Value Date/Time   HGBA1C 5.7 (H) 12/28/2019 04:31 PM   HGBA1C 6.0 07/07/2019 08:16 AM   HGBA1C 5.9 04/08/2017 12:00 AM   GFR 92.65 02/02/2020 11:05 AM   GFR 84.46 07/07/2019 08:16 AM    Last diabetic Eye exam: No results found for: HMDIABEYEEXA  Last diabetic Foot exam: No results found for: HMDIABFOOTEX   Lab Results  Component Value Date   CHOL 147 07/07/2019   HDL 49.90 07/07/2019   LDLCALC 86 07/07/2019   LDLDIRECT 89.0 08/18/2018   TRIG 55.0 07/07/2019   CHOLHDL 3 07/07/2019    Hepatic Function Latest Ref Rng & Units 02/02/2020 12/28/2019 07/07/2019  Total Protein 6.0 - 8.3 g/dL 6.8 6.5 6.5  Albumin 3.5 - 5.2 g/dL 4.0 - 4.3  AST 0 - 37 U/L 37 38(H) 46(H)  ALT 0 - 53 U/L 59(H) 51(H) 41  Alk Phosphatase 39 - 117 U/L 70 - 51  Total Bilirubin 0.2 - 1.2 mg/dL 0.9 1.0 0.9   Lab Results  Component Value Date/Time   TSH <0.01 Repeated and verified X2. (L) 02/02/2020 11:05 AM   TSH <0.01 (L) 12/28/2019 04:31 PM    CBC Latest Ref Rng & Units 12/28/2019 01/29/2019 10/23/2017  WBC 3.8 - 10.8 Thousand/uL 5.9 4.6 5.2  Hemoglobin 13.2 - 17.1 g/dL 14.3 14.4 15.1  Hematocrit 38.5 - 50.0 % 43.1 43.3 44.8  Platelets 140 - 400 Thousand/uL 268 238.0 259.0    No results found for: VD25OH  Clinical ASCVD: No  The 10-year ASCVD risk score Mikey Bussing DC Brooke Bonito., et  al., 2013) is: 48.5%   Values used to calculate the score:     Age: 76 years     Sex: Male     Is Non-Hispanic African American: No     Diabetic: Yes     Tobacco smoker: No     Systolic Blood Pressure: 195 mmHg     Is BP treated: Yes     HDL Cholesterol: 49.9 mg/dL     Total Cholesterol: 147 mg/dL    Depression screen [facility] 2/9 02/02/2020 07/09/2019 02/05/2018  Decreased Interest 0 0 0  Down, Depressed, Hopeless 0 0 1  PHQ - 2 Score 0 0 1  Altered sleeping - - 1  Tired, decreased energy - - 0  Change in appetite - - 0  Feeling bad or failure about yourself  - - 0  Trouble concentrating  - - 1  Moving slowly or fidgety/restless - - 0  Suicidal thoughts - - 0  PHQ-9 Score - - 3  Difficult doing work/chores - - Somewhat difficult   Social History   Tobacco Use  Smoking Status Never Smoker  Smokeless Tobacco Never Used   BP Readings from Last 3 Encounters:  02/02/20 138/80  12/28/19 (!) 144/74  07/09/19 134/62   Pulse Readings from Last 3 Encounters:  02/02/20 71  12/28/19 80  07/09/19 64   Wt Readings from Last 3 Encounters:  02/02/20 156 lb 3.2 oz (70.9 kg)  12/28/19 154 lb 12.8 oz (70.2 kg)  07/09/19 174 lb 12.8 oz (79.3 kg)   Assessment/Interventions: Review of patient past medical history, allergies, medications, health status, including review of consultants reports, laboratory and other test data, was performed as part of comprehensive evaluation and provision of chronic care management services.   SDOH:  (Social Determinants of Health) assessments and interventions performed:  CCM Care Plan  No Known Allergies  Medications Reviewed Today    Reviewed by Madelin Rear, [facility] (Pharmacist) on 02/28/20 at 66  Med List Status: <None>  Medication Order Taking? Sig Documenting Provider Last Dose Status Informant  amphetamine-dextroamphetamine (ADDERALL) 30 MG tablet [id number]  Take 0.5 tablets by mouth 2 (two) times daily. [name], MD  Active   atorvastatin (LIPITOR) 40 MG tablet [id number]  TAKE ONE TABLET BY MOUTH DAILY [name], MD  Active   bimatoprost (LUMIGAN) 0.01 % SOLN [id number]  1 drop at bedtime. [provider]  Active   calcium carbonate (TUMS - DOSED IN MG ELEMENTAL CALCIUM) 500 MG chewable tablet [id number]  Chew 1 tablet by mouth daily. [provider]  Active   CHONDROITIN SULFATE PO [id number]  Take 200 mg by mouth daily. [provider]  Active   DHA-EPA-GLA PO [id number]  Take 690 mg by mouth daily.  [provider]  Active   diclofenac Sodium (VOLTAREN) 1 % GEL [id number]  APPLY 2 GRAMS TO  THE AFFECTED AREA(S) EVERY NIGHT AT BEDTIME [name], MD  Active   escitalopram (LEXAPRO) 10 MG tablet [id number]  Take 1 tablet (10 mg total) by mouth daily. [name], MD  Active   Glucosamine HCl 1500 MG TABS [id number]  Take 1,500 mg by mouth daily. [provider]  Active   Hyaluronic Acid-Vitamin C (HYALURONIC ACID PO) [id number]  Take 3.3 mg by mouth daily. [provider]  Active   hydrochlorothiazide (MICROZIDE) 12.5 MG capsule [id number]  Take 1 capsule (12.5 mg total) by mouth daily. [name], MD  Active   Investigational vitamin D  600 UNITS capsule SWOG [id number]  Take 625 Units by mouth daily. Take with food. [provider]  Active   latanoprost (XALATAN) 0.005 % ophthalmic solution [id number]  Place 1 drop into both eyes at bedtime. [provider]  Active   levothyroxine (SYNTHROID) 50 MCG tablet [id number]  Take 1 tablet (50 mcg total) by mouth daily. [name], MD  Active   Misc Natural Products (TART CHERRY ADVANCED PO) [id number]  Take 800 mg by mouth daily. [provider]  Active   Probiotic Product (PROBIOTIC ADVANCED PO) [id number]  Take 1 tablet by mouth daily. [provider]  Active   valsartan (DIOVAN) 320 MG tablet [id number]  Take 1 tablet (320 mg total) by mouth daily. [name], MD  Active   vitamin E 400 UNIT capsule [id number]  Take 800 Units by mouth daily. [provider]  Active           Patient Active Problem List   Diagnosis Date Noted  . OCD (obsessive compulsive disorder) 01/01/2018  . Strain of right tibialis anterior muscle 10/30/2017  . Arthritis 07/07/2017  . Hypertension   . Hyperlipidemia   . Hypothyroidism   . Hyperglycemia   . Attention deficit disorder (ADD) in adult   . Heart murmur   . Seasonal allergies   . Colon polyp     Immunization History  Administered Date(s) Administered  . Fluad Quad(high Dose 65+) 09/22/2018, 10/13/2019   . Influenza, High Dose Seasonal PF 10/10/2017  . MMR 04/14/2013  . PFIZER(Purple Top)SARS-COV-2 Vaccination 02/28/2019, 03/25/2019, 10/22/2019  . Pneumococcal Conjugate-13 07/01/2013  . Pneumococcal Polysaccharide-23 01/03/2012  . Tdap 05/17/2013  . Zoster 08/02/2010  . Zoster Recombinat (Shingrix) 10/12/2017, 02/17/2018   Conditions to be addressed/monitored:  HTN, HLD and hypothyroidism  Care Plan : General Pharmacy (Adult)  Updates made by Madelin Rear, [facility] since 02/28/2020 12:00 AM    Problem: hypertension, hyperlipidemia, hypothyroidism   Priority: High  Note:   Medication Assistance: None required.  Patient affirms current coverage meets needs. would like cost review through [facility].  Patient's preferred pharmacy is:  [name] [address] [place] [address] Phone: [phone] Fax: [phone]  [place], [place] [address] [place] [address] Phone: [phone] Fax: [place] [facility][id number] [place], [place] [name] [address] Phone: [phone] Fax: [phone]  We discussed: Benefits of medication synchronization, packaging and delivery as well as enhanced pharmacist oversight with [facility]. Patient decided to: review cost of Rx and OTC medications next month.  Follow Up:  Patient agrees to Care Plan and Follow-up.  Plan: Telephone follow up appointment with care management team member scheduled for:  6 months and The care management team will reach out to the patient again over the next 60 days.  CPA: 1 month cost review of [name] for patient and patient's wife, [name]. Also let them know costs of OTC through [facility].  Current Barriers:  . no specific barriers. will review cost through [facility]   Pharmacist Clinical Goal(s):   [name] Kitchen Over the next 180 days, patient will verbalize ability to afford treatment regimen . contact provider office for questions/concerns as evidenced notation of same in electronic health record through collaboration with PharmD and provider.   Interventions: . 1:1 collaboration with [name],  MD regarding development and update of comprehensive plan of care as evidenced by provider attestation and co-signature . Inter-disciplinary care team collaboration (see longitudinal plan of care) . Comprehensive medication review performed; medication list updated in electronic medical record  Hypertension (BP goal <140/90) -controlled -Current treatment: . Valsartan 320 mg (02/02/2020) . Hydrochlorothiazide 12.5 mg -Medications previously tried: losartan 100 mg, amlodipine  -Current home readings: 135-139/60s -Current dietary habits: Cheerios, fruit such as strawberries blue berries black berries. Lunch - beef stew, salmon. Evening - fat free yogurt, ice cream, nuts. Drinks - 3 cups of coffee per day, several cups of water  -Current exercise habits: occasional walking, pilates session once a week -Denies hypotensive/hypertensive symptoms -Educated on BP goals and benefits of medications for prevention of heart attack, stroke and kidney damage; Daily salt intake goal < 2300 mg; Exercise goal of 150 minutes per week; Importance of home blood pressure monitoring; -Counseled to monitor BP at home, document, and provide log at future appointments -Counseled on diet and exercise extensively Recommended to continue current medication Counseled on medications used to treat hypertension  Hyperlipidemia: (LDL goal < 70) -uncontrolled -Current treatment: . Atorvastatin 40 mg once daily -Medications previously tried: n/a  -Current dietary patterns: see hypertension -Current exercise habits: see hypertension -Educated on Cholesterol goals;  Importance of limiting foods high in  cholesterol; Exercise goal of 150 minutes per week; -Counseled on diet and exercise extensively Recommended to continue current medication  Hypothyroidism (Goal: Normalization of TSH/free thyroid hormone) -uncontrolled -Current treatment  . No medications at this time; levothyroxine on hold. Scheduled for f/u with Dr. Yong Channel.  -Medications previously tried: n/a  -Counseled on proper use of levothyroxine.   Patient Goals/Self-Care Activities . Over the next 180 days, patient will:  - take medications as prescribed check blood pressure  , document, and provide at future appointments  Follow Up Plan: Telephone follow up appointment with care management team member scheduled for: The care management team will reach out to the patient again over the next 60 days.   Current Barriers:  . no specific barriers. will review costs of Rx and OTC through [facility]  Patient Goals/Self-Care Activities . Over the next 180 days, patient will:  - take medications as prescribed  Follow Up Plan: The care management team will reach out to the patient again over the next 60 days.  Pharmacist f/u telephone visit next 6 months     Future Appointments  Date Time Provider [place]  03/03/2020  1:00 PM [place]  03/08/2020 10:00 AM [name], MD [facility]  07/13/2020  4:00 PM [name], MD [facility]  08/30/2020  1:30 PM [facility] CCM PHARMACIST [facility]  08/31/2020  9:15 AM Hayden Pedro, MD [facility] None   Madelin Rear, Pharm.D., BCGP Clinical Pharmacist [place] [phone]

## 2020-02-28 ENCOUNTER — Other Ambulatory Visit: Payer: Self-pay

## 2020-02-28 ENCOUNTER — Ambulatory Visit (INDEPENDENT_AMBULATORY_CARE_PROVIDER_SITE_OTHER): Payer: PPO

## 2020-02-28 DIAGNOSIS — I10 Essential (primary) hypertension: Secondary | ICD-10-CM | POA: Diagnosis not present

## 2020-02-28 DIAGNOSIS — R739 Hyperglycemia, unspecified: Secondary | ICD-10-CM

## 2020-02-28 DIAGNOSIS — E039 Hypothyroidism, unspecified: Secondary | ICD-10-CM

## 2020-02-28 DIAGNOSIS — E785 Hyperlipidemia, unspecified: Secondary | ICD-10-CM

## 2020-02-28 NOTE — Patient Instructions (Addendum)
Darius Long,  Thank you for taking the time to review your medications with me today.  I have included our care plan/goals in the following pages. Please review and call me at [phone] with any questions!  Thanks! Darius Long, Pharm.D., BCGP Clinical Pharmacist [facility] [place] [facility] [phone]  Goals Addressed            This Visit's Progress   . Track and Manage My Blood Pressure-Hypertension   On track    Timeframe:  Long-Range Goal Priority:  High Start Date:  02/28/2020            Expected End Date: 08/27/2020  Follow Up Date 08/27/2020    - check blood pressure daily - write blood pressure results in a log or diary    Why is this important?    You won't feel high blood pressure, but it can still hurt your blood vessels.   High blood pressure can cause heart or kidney problems. It can also cause a stroke.   Making lifestyle changes like losing a little weight or eating less salt will help.   Checking your blood pressure at home and at different times of the day can help to control blood pressure.   If the doctor prescribes medicine remember to take it the way the doctor ordered.   Call the office if you cannot afford the medicine or if there are questions about it.     Notes:       Patient Care Plan: General Pharmacy (Adult)    Problem Identified: hypertension, hyperlipidemia, hypothyroidism   Priority: High  Note:   Medication Assistance: None required.  Patient affirms current coverage meets needs. would like cost review through [facility].  Patient's preferred pharmacy is:  D[facility] Long [facility] [address], [place] - [place] [address] Phone: [phone] Fax: [phone]  [place], [place] [address] [place] [address] Phone: [phone] Fax: [name] [facility] [place], [place]  [name] [name] [address] Phone: [phone] Fax: [phone]  We discussed: Benefits of medication synchronization, packaging and delivery as well as enhanced pharmacist oversight with [facility]. Patient decided to: review cost of Rx and OTC medications next month.  Follow Up:  Patient agrees to Care Plan and Follow-up.  Plan: Telephone follow up appointment with care management team member scheduled for:  6 months and The care management team will reach out to the patient again over the next 60 days.  CPA: 1 month cost review of [facility] vs [facility] for patient and patient's wife, Darius Long. Also let them know costs of OTC through [facility].  Current Barriers:  . no specific barriers. will review cost through [facility]   Pharmacist Clinical Goal(s):  [name] Kitchen Over the next 180 days, patient will verbalize ability to afford treatment regimen . contact provider office for questions/concerns as evidenced notation of same in electronic health record through collaboration with PharmD and provider.   Interventions: . 1:1 collaboration with Darius Olp, MD regarding development and update of comprehensive plan of care as evidenced by provider attestation and co-signature . Inter-disciplinary care team collaboration (see longitudinal plan of care) . Comprehensive medication review performed; medication list updated in electronic medical record  Hypertension (BP goal <140/90) -controlled -Current treatment: . Valsartan 320 mg (02/02/2020) . Hydrochlorothiazide  12.5 mg -Medications previously tried: losartan 100 mg, amlodipine  -Current home readings: 135-139/60s -Current dietary habits: Cheerios, fruit such as strawberries blue berries black berries. Lunch - beef stew, salmon. Evening - fat free yogurt, ice cream, nuts. Drinks - 3 cups of coffee per day, several cups of water  -Current exercise habits: occasional walking, pilates session once a  week -Denies hypotensive/hypertensive symptoms -Educated on BP goals and benefits of medications for prevention of heart attack, stroke and kidney damage; Daily salt intake goal < 2300 mg; Exercise goal of 150 minutes per week; Importance of home blood pressure monitoring; -Counseled to monitor BP at home, document, and provide log at future appointments -Counseled on diet and exercise extensively Recommended to continue current medication Counseled on medications used to treat hypertension  Hyperlipidemia: (LDL goal < 70) -uncontrolled -Current treatment: . Atorvastatin 40 mg once daily -Medications previously tried: n/a  -Current dietary patterns: see hypertension -Current exercise habits: see hypertension -Educated on Cholesterol goals;  Importance of limiting foods high in cholesterol; Exercise goal of 150 minutes per week; -Counseled on diet and exercise extensively Recommended to continue current medication  Hypothyroidism (Goal: Normalization of TSH/free thyroid hormone) -uncontrolled -Current treatment  . No medications at this time; levothyroxine on hold. Scheduled for f/u with Dr. Yong Long.  -Medications previously tried: n/a  -Counseled on proper use of levothyroxine.   Patient Goals/Self-Care Activities . Over the next 180 days, patient will:  - take medications as prescribed check blood pressure  , document, and provide at future appointments  Follow Up Plan: Telephone follow up appointment with care management team member scheduled for: The care management team will reach out to the patient again over the next 60 days.   Current Barriers:  . no specific barriers. will review costs of Rx and OTC through [facility]  Patient Goals/Self-Care Activities . Over the next 180 days, patient will:  - take medications as prescribed  Follow Up Plan: The care management team will reach out to the patient again over the next 60 days.  Pharmacist f/u telephone visit next 6  months    The patient verbalized understanding of instructions provided today and agreed to receive a MyChart copy of patient instruction and/or educational materials. Telephone follow up appointment with pharmacy team member scheduled for: See next appointment with "Care Management Staff" under "What's Next" below.  Hypertension, Adult High blood pressure (hypertension) is when the force of blood pumping through the arteries is too strong. The arteries are the blood vessels that carry blood from the heart throughout the body. Hypertension forces the heart to work harder to pump blood and may cause arteries to become narrow or stiff. Untreated or uncontrolled hypertension can cause a heart attack, heart failure, a stroke, kidney disease, and other problems. A blood pressure reading consists of a higher number over a lower number. Ideally, your blood pressure should be below 120/80. The first ("top") number is called the systolic pressure. It is a measure of the pressure in your arteries as your heart beats. The second ("bottom") number is called the diastolic pressure. It is a measure of the pressure in your arteries as the heart relaxes. What are the causes? The exact cause of this condition is not known. There are some conditions that result in or are related to high blood pressure. What increases the risk? Some risk factors for high blood pressure are under your control. The following factors may make you more likely to develop this condition:  Smoking.  Having type 2 diabetes mellitus, high cholesterol, or both.  Not getting enough exercise or physical activity.  Being overweight.  Having too much fat, sugar, calories, or salt (sodium) in your diet.  Drinking too much alcohol. Some risk factors for high blood pressure may be difficult or impossible to change. Some of these factors include:  Having chronic kidney disease.  Having a family history of high blood pressure.  Age. Risk  increases with age.  Race. You may be at higher risk if you are African American.  Gender. Men are at higher risk than women before age 54. After age 75, women are at higher risk than men.  Having obstructive sleep apnea.  Stress. What are the signs or symptoms? High blood pressure may not cause symptoms. Very high blood pressure (hypertensive crisis) may cause:  Headache.  Anxiety.  Shortness of breath.  Nosebleed.  Nausea and vomiting.  Vision changes.  Severe chest pain.  Seizures. How is this diagnosed? This condition is diagnosed by measuring your blood pressure while you are seated, with your arm resting on a flat surface, your legs uncrossed, and your feet flat on the floor. The cuff of the blood pressure monitor will be placed directly against the skin of your upper arm at the level of your heart. It should be measured at least twice using the same arm. Certain conditions can cause a difference in blood pressure between your right and left arms. Certain factors can cause blood pressure readings to be lower or higher than normal for a short period of time:  When your blood pressure is higher when you are in a health care provider's office than when you are at home, this is called white coat hypertension. Most people with this condition do not need medicines.  When your blood pressure is higher at home than when you are in a health care provider's office, this is called masked hypertension. Most people with this condition may need medicines to control blood pressure. If you have a high blood pressure reading during one visit or you have normal blood pressure with other risk factors, you may be asked to:  Return on a different day to have your blood pressure checked again.  Monitor your blood pressure at home for 1 week or longer. If you are diagnosed with hypertension, you may have other blood or imaging tests to help your health care provider understand your overall risk  for other conditions. How is this treated? This condition is treated by making healthy lifestyle changes, such as eating healthy foods, exercising more, and reducing your alcohol intake. Your health care provider may prescribe medicine if lifestyle changes are not enough to get your blood pressure under control, and if:  Your systolic blood pressure is above 130.  Your diastolic blood pressure is above 80. Your personal target blood pressure may vary depending on your medical conditions, your age, and other factors. Follow these instructions at home: Eating and drinking  Eat a diet that is high in fiber and potassium, and low in sodium, added sugar, and fat. An example eating plan is called the DASH (Dietary Approaches to Stop Hypertension) diet. To eat this way: ? Eat plenty of fresh fruits and vegetables. Try to fill one half of your plate at each meal with fruits and vegetables. ? Eat whole grains, such as whole-wheat pasta, brown rice, or whole-grain bread. Fill about one fourth of your plate with whole grains. ? Eat or drink low-fat dairy products, such  as skim milk or low-fat yogurt. ? Avoid fatty cuts of meat, processed or cured meats, and poultry with skin. Fill about one fourth of your plate with lean proteins, such as fish, chicken without skin, beans, eggs, or tofu. ? Avoid pre-made and processed foods. These tend to be higher in sodium, added sugar, and fat.  Reduce your daily sodium intake. Most people with hypertension should eat less than 1,500 mg of sodium a day.  Do not drink alcohol if: ? Your health care provider tells you not to drink. ? You are pregnant, may be pregnant, or are planning to become pregnant.  If you drink alcohol: ? Limit how much you use to:  0-1 drink a day for women.  0-2 drinks a day for men. ? Be aware of how much alcohol is in your drink. In the [place] one drink equals one 12 oz bottle of beer (355 mL), one 5 oz glass of wine (148 mL), or one 1  oz glass of hard liquor (44 mL).   Lifestyle  Work with your health care provider to maintain a healthy body weight or to lose weight. Ask what an ideal weight is for you.  Get at least 30 minutes of exercise most days of the week. Activities may include walking, swimming, or biking.  Include exercise to strengthen your muscles (resistance exercise), such as Pilates or lifting weights, as part of your weekly exercise routine. Try to do these types of exercises for 30 minutes at least 3 days a week.  Do not use any products that contain nicotine or tobacco, such as cigarettes, e-cigarettes, and chewing tobacco. If you need help quitting, ask your health care provider.  Monitor your blood pressure at home as told by your health care provider.  Keep all follow-up visits as told by your health care provider. This is important.   Medicines  Take over-the-counter and prescription medicines only as told by your health care provider. Follow directions carefully. Blood pressure medicines must be taken as prescribed.  Do not skip doses of blood pressure medicine. Doing this puts you at risk for problems and can make the medicine less effective.  Ask your health care provider about side effects or reactions to medicines that you should watch for. Contact a health care provider if you:  Think you are having a reaction to a medicine you are taking.  Have headaches that keep coming back (recurring).  Feel dizzy.  Have swelling in your ankles.  Have trouble with your vision. Get help right away if you:  Develop a severe headache or confusion.  Have unusual weakness or numbness.  Feel faint.  Have severe pain in your chest or abdomen.  Vomit repeatedly.  Have trouble breathing. Summary  Hypertension is when the force of blood pumping through your arteries is too strong. If this condition is not controlled, it may put you at risk for serious complications.  Your personal target blood  pressure may vary depending on your medical conditions, your age, and other factors. For most people, a normal blood pressure is less than 120/80.  Hypertension is treated with lifestyle changes, medicines, or a combination of both. Lifestyle changes include losing weight, eating a healthy, low-sodium diet, exercising more, and limiting alcohol. This information is not intended to replace advice given to you by your health care provider. Make sure you discuss any questions you have with your health care provider. Document Revised: 09/17/2017 Document Reviewed: 09/17/2017 Elsevier Patient Education  2021 Elsevier  Inc.

## 2020-03-01 ENCOUNTER — Telehealth: Payer: Self-pay

## 2020-03-01 NOTE — Chronic Care Management (AMB) (Signed)
    Chronic Care Management Pharmacy Assistant   Name: Darius Long.  MRN: [id number] DOB: [birth year]  Reason for Encounter: Medication Review  PCP : [name], MD  Allergies:  No Known Allergies  Medications: Outpatient Encounter Medications as of 03/01/2020  Medication Sig  . amphetamine-dextroamphetamine (ADDERALL) 30 MG tablet Take 0.5 tablets by mouth 2 (two) times daily. (Patient not taking: Reported on 02/28/2020)  . atorvastatin (LIPITOR) 40 MG tablet TAKE ONE TABLET BY MOUTH DAILY  . bimatoprost (LUMIGAN) 0.01 % SOLN 1 drop at bedtime. (Patient not taking: Reported on 02/28/2020)  . Black Pepper-Turmeric (TURMERIC CURCUMIN) 05-998 MG CAPS Take 1 capsule by mouth daily.  . calcium carbonate (TUMS - DOSED IN MG ELEMENTAL CALCIUM) 500 MG chewable tablet Chew 1 tablet by mouth daily.  . CHONDROITIN SULFATE PO Take 200 mg by mouth daily.  . DHA-EPA-GLA PO Take 690 mg by mouth daily.   . diclofenac Sodium (VOLTAREN) 1 % GEL APPLY 2 GRAMS TO THE AFFECTED AREA(S) EVERY NIGHT AT BEDTIME  . escitalopram (LEXAPRO) 10 MG tablet Take 1 tablet (10 mg total) by mouth daily.  . Glucosamine HCl 1500 MG TABS Take 1,500 mg by mouth daily.  [name] Kitchen Hyaluronic Acid-Vitamin C (HYALURONIC ACID PO) Take 3.3 mg by mouth daily.  . hydrochlorothiazide (MICROZIDE) 12.5 MG capsule Take 1 capsule (12.5 mg total) by mouth daily.  . Investigational vitamin D 600 UNITS capsule SWOG C1638 Take 625 Units by mouth daily. Take with food.  . latanoprost (XALATAN) 0.005 % ophthalmic solution Place 1 drop into both eyes at bedtime.  [name] Kitchen levothyroxine (SYNTHROID) 50 MCG tablet Take 1 tablet (50 mcg total) by mouth daily. (Patient not taking: Reported on 02/28/2020)  . Probiotic Product (PROBIOTIC ADVANCED PO) Take 1 tablet by mouth daily.  . valsartan (DIOVAN) 320 MG tablet Take 1 tablet (320 mg total) by mouth daily.  . vitamin E 400 UNIT capsule Take 800 Units by mouth daily.   No facility-administered encounter  medications on file as of 03/01/2020.    Current Diagnosis: Patient Active Problem List   Diagnosis Date Noted  . OCD (obsessive compulsive disorder) 01/01/2018  . Strain of right tibialis anterior muscle 10/30/2017  . Arthritis 07/07/2017  . Hypertension   . Hyperlipidemia   . Hypothyroidism   . Hyperglycemia   . Attention deficit disorder (ADD) in adult   . Heart murmur   . Seasonal allergies   . Colon polyp    Reviewed chart for medication changes ahead of medication coordination call.  [place] pharmacy Total yearly drug cost + premium cost - [phone]  [place] Total yearly drug cost + premium cost - $690.80  Both pharmacies provide the exact same cost of each drug.   Patient aware and verbally understands, he chooses to leave his prescription medications at [facility]. Patient wants to buy and receive his OTC medications through [facility] through packaging.  Onboarding process completed.  Confirmed delivery date of 03/20/2020, advised patient that pharmacy will contact them the morning of delivery.  April D Calhoun, [place] Pharmacist Assistant [phone]   Follow-Up:  Coordination of Enhanced Pharmacy Services, Medication Cost Review and Pharmacist Review

## 2020-03-03 ENCOUNTER — Other Ambulatory Visit: Payer: Self-pay

## 2020-03-03 ENCOUNTER — Ambulatory Visit (INDEPENDENT_AMBULATORY_CARE_PROVIDER_SITE_OTHER): Payer: PPO

## 2020-03-03 DIAGNOSIS — Z0000 Encounter for general adult medical examination without abnormal findings: Secondary | ICD-10-CM

## 2020-03-03 NOTE — Patient Instructions (Addendum)
[name] , Thank you for taking time to come for your Medicare Wellness Visit. I appreciate your ongoing commitment to your health goals. Please review the following plan we discussed and let me know if I can assist you in the future.   Screening recommendations/referrals: Colonoscopy: Done 11/12/16 Recommended yearly ophthalmology/optometry visit for glaucoma screening and checkup Recommended yearly dental visit for hygiene and checkup  Vaccinations: Influenza vaccine: Done 10/13/19 Up to date Pneumococcal vaccine: Up to date Tdap vaccine: Up to date Shingles vaccine: Completed 10/12/17 & 02/17/18   Covid-19: Completed 2/7, 3/4, & 10/22/19  Advanced directives: Copy in chart  Conditions/risks identified: Maintain blood pressure and thyroid   Next appointment: Follow up in one year for your annual wellness visit.   Preventive Care 76 Years and Older, Male Preventive care refers to lifestyle choices and visits with your health care provider that can promote health and wellness. What does preventive care include?  A yearly physical exam. This is also called an annual well check.  Dental exams once or twice a year.  Routine eye exams. Ask your health care provider how often you should have your eyes checked.  Personal lifestyle choices, including:  Daily care of your teeth and gums.  Regular physical activity.  Eating a healthy diet.  Avoiding tobacco and drug use.  Limiting alcohol use.  Practicing safe sex.  Taking low doses of aspirin every day.  Taking vitamin and mineral supplements as recommended by your health care provider. What happens during an annual well check? The services and screenings done by your health care provider during your annual well check will depend on your age, overall health, lifestyle risk factors, and family history of disease. Counseling  Your health care provider may ask you questions about your:  Alcohol use.  Tobacco use.  Drug  use.  Emotional well-being.  Home and relationship well-being.  Sexual activity.  Eating habits.  History of falls.  Memory and ability to understand (cognition).  Work and work Statistician. Screening  You may have the following tests or measurements:  Height, weight, and BMI.  Blood pressure.  Lipid and cholesterol levels. These may be checked every 5 years, or more frequently if you are over 90 years old.  Skin check.  Lung cancer screening. You may have this screening every year starting at age 33 if you have a 30-pack-year history of smoking and currently smoke or have quit within the past 15 years.  Fecal occult blood test (FOBT) of the stool. You may have this test every year starting at age 97.  Flexible sigmoidoscopy or colonoscopy. You may have a sigmoidoscopy every 5 years or a colonoscopy every 10 years starting at age 47.  Prostate cancer screening. Recommendations will vary depending on your family history and other risks.  Hepatitis C blood test.  Hepatitis B blood test.  Sexually transmitted disease (STD) testing.  Diabetes screening. This is done by checking your blood sugar (glucose) after you have not eaten for a while (fasting). You may have this done every 1-3 years.  Abdominal aortic aneurysm (AAA) screening. You may need this if you are a current or former smoker.  Osteoporosis. You may be screened starting at age 43 if you are at high risk. Talk with your health care provider about your test results, treatment options, and if necessary, the need for more tests. Vaccines  Your health care provider may recommend certain vaccines, such as:  Influenza vaccine. This is recommended every year.  Tetanus, diphtheria, and acellular pertussis (Tdap, Td) vaccine. You may need a Td booster every 10 years.  Zoster vaccine. You may need this after age 11.  Pneumococcal 13-valent conjugate (PCV13) vaccine. One dose is recommended after age  6.  Pneumococcal polysaccharide (PPSV23) vaccine. One dose is recommended after age 69. Talk to your health care provider about which screenings and vaccines you need and how often you need them. This information is not intended to replace advice given to you by your health care provider. Make sure you discuss any questions you have with your health care provider. Document Released: 02/03/2015 Document Revised: 09/27/2015 Document Reviewed: 11/08/2014 Elsevier Interactive Patient Education  2017 [place] Prevention in the Home Falls can cause injuries. They can happen to people of all ages. There are many things you can do to make your home safe and to help prevent falls. What can I do on the outside of my home?  Regularly fix the edges of walkways and driveways and fix any cracks.  Remove anything that might make you trip as you walk through a door, such as a raised step or threshold.  Trim any bushes or trees on the path to your home.  Use bright outdoor lighting.  Clear any walking paths of anything that might make someone trip, such as rocks or tools.  Regularly check to see if handrails are loose or broken. Make sure that both sides of any steps have handrails.  Any raised decks and porches should have guardrails on the edges.  Have any leaves, snow, or ice cleared regularly.  Use sand or salt on walking paths during winter.  Clean up any spills in your garage right away. This includes oil or grease spills. What can I do in the bathroom?  Use night lights.  Install grab bars by the toilet and in the tub and shower. Do not use towel bars as grab bars.  Use non-skid mats or decals in the tub or shower.  If you need to sit down in the shower, use a plastic, non-slip stool.  Keep the floor dry. Clean up any water that spills on the floor as soon as it happens.  Remove soap buildup in the tub or shower regularly.  Attach bath mats securely with double-sided  non-slip rug tape.  Do not have throw rugs and other things on the floor that can make you trip. What can I do in the bedroom?  Use night lights.  Make sure that you have a light by your bed that is easy to reach.  Do not use any sheets or blankets that are too big for your bed. They should not hang down onto the floor.  Have a firm chair that has side arms. You can use this for support while you get dressed.  Do not have throw rugs and other things on the floor that can make you trip. What can I do in the kitchen?  Clean up any spills right away.  Avoid walking on wet floors.  Keep items that you use a lot in easy-to-reach places.  If you need to reach something above you, use a strong step stool that has a grab bar.  Keep electrical cords out of the way.  Do not use floor polish or wax that makes floors slippery. If you must use wax, use non-skid floor wax.  Do not have throw rugs and other things on the floor that can make you trip. What can I do  with my stairs?  Do not leave any items on the stairs.  Make sure that there are handrails on both sides of the stairs and use them. Fix handrails that are broken or loose. Make sure that handrails are as long as the stairways.  Check any carpeting to make sure that it is firmly attached to the stairs. Fix any carpet that is loose or worn.  Avoid having throw rugs at the top or bottom of the stairs. If you do have throw rugs, attach them to the floor with carpet tape.  Make sure that you have a light switch at the top of the stairs and the bottom of the stairs. If you do not have them, ask someone to add them for you. What else can I do to help prevent falls?  Wear shoes that:  Do not have high heels.  Have rubber bottoms.  Are comfortable and fit you well.  Are closed at the toe. Do not wear sandals.  If you use a stepladder:  Make sure that it is fully opened. Do not climb a closed stepladder.  Make sure that both  sides of the stepladder are locked into place.  Ask someone to hold it for you, if possible.  Clearly mark and make sure that you can see:  Any grab bars or handrails.  First and last steps.  Where the edge of each step is.  Use tools that help you move around (mobility aids) if they are needed. These include:  Canes.  Walkers.  Scooters.  Crutches.  Turn on the lights when you go into a dark area. Replace any light bulbs as soon as they burn out.  Set up your furniture so you have a clear path. Avoid moving your furniture around.  If any of your floors are uneven, fix them.  If there are any pets around you, be aware of where they are.  Review your medicines with your doctor. Some medicines can make you feel dizzy. This can increase your chance of falling. Ask your doctor what other things that you can do to help prevent falls. This information is not intended to replace advice given to you by your health care provider. Make sure you discuss any questions you have with your health care provider. Document Released: 11/03/2008 Document Revised: 06/15/2015 Document Reviewed: 02/11/2014 Elsevier Interactive Patient Education  2017 [employer].

## 2020-03-03 NOTE — Progress Notes (Signed)
Virtual Visit via Telephone Note  I connected with  Darius Long. on 03/03/20 at  1:00 PM EST by telephone and verified that I am speaking with the correct person using two identifiers.  Medicare Annual Wellness visit completed telephonically due to Covid-19 pandemic.   Persons participating in this call: This Health Coach and this patient.   Location: Patient: Home Provider: Office   I discussed the limitations, risks, security and privacy concerns of performing an evaluation and management service by telephone and the availability of in person appointments. The patient expressed understanding and agreed to proceed.  Unable to perform video visit due to video visit attempted and failed and/or patient does not have video capability.   Some vital signs may be absent or patient reported.   Willette Brace, LPN   Subjective:   Darius Long. is a 75 y.o. male who presents for Medicare Annual/Subsequent preventive examination.  Review of Systems     Cardiac Risk Factors include: advanced age (>43mn, >>52women);dyslipidemia;male gender;hypertension     Objective:    There were no vitals filed for this visit. There is no height or weight on file to calculate BMI.  Advanced Directives 03/03/2020 12/01/2018  Does Patient Have a Medical Advance Directive? Yes Yes  Type of Advance Directive Healthcare Power of [place] Does patient want to make changes to medical advance directive? - No - Patient declined  Copy of [place] Chart? Yes - validated most recent copy scanned in chart (See row information) Yes - validated most recent copy scanned in chart (See row information)    Current Medications (verified) Outpatient Encounter Medications as of 03/03/2020  Medication Sig  . atorvastatin (LIPITOR) 40 MG tablet TAKE ONE TABLET BY MOUTH DAILY  . Black Pepper-Turmeric (TURMERIC CURCUMIN) 05-998 MG CAPS  Take 1 capsule by mouth daily.  . Calcium Citrate-Vitamin D (CITRACAL + D PO) Take 25 mcg by mouth daily.  . CHONDROITIN SULFATE PO Take 200 mg by mouth daily.  . diclofenac Sodium (VOLTAREN) 1 % GEL APPLY 2 GRAMS TO THE AFFECTED AREA(S) EVERY NIGHT AT BEDTIME  . escitalopram (LEXAPRO) 10 MG tablet Take 1 tablet (10 mg total) by mouth daily.  . Glucosamine HCl 1500 MG TABS Take 1,500 mg by mouth daily.  .[name] KitchenHyaluronic Acid-Vitamin C (HYALURONIC ACID PO) Take 3.3 mg by mouth daily.  . hydrochlorothiazide (MICROZIDE) 12.5 MG capsule Take 1 capsule (12.5 mg total) by mouth daily.  .[name] Kitchenlatanoprost (XALATAN) 0.005 % ophthalmic solution Place 1 drop into both eyes at bedtime.  . Omega-3 Fatty Acids (OMEGA-3 FISH OIL PO) Take by mouth.  . Probiotic Product (PROBIOTIC ADVANCED PO) Take 2 tablets by mouth daily.  . valsartan (DIOVAN) 320 MG tablet Take 1 tablet (320 mg total) by mouth daily.  . vitamin E 400 UNIT capsule Take 800 Units by mouth daily.  . [DISCONTINUED] calcium carbonate (TUMS - DOSED IN MG ELEMENTAL CALCIUM) 500 MG chewable tablet Chew 1 tablet by mouth daily.  . [DISCONTINUED] DHA-EPA-GLA PO Take 690 mg by mouth daily.   . [DISCONTINUED] amphetamine-dextroamphetamine (ADDERALL) 30 MG tablet Take 0.5 tablets by mouth 2 (two) times daily. (Patient not taking: Reported on 02/28/2020)  . [DISCONTINUED] bimatoprost (LUMIGAN) 0.01 % SOLN 1 drop at bedtime. (Patient not taking: Reported on 02/28/2020)  . [DISCONTINUED] Investigational vitamin D 600 UNITS capsule SWOG S0812 Take 625 Units by mouth daily. Take with food.  . [DISCONTINUED] levothyroxine (  SYNTHROID) 50 MCG tablet Take 1 tablet (50 mcg total) by mouth daily. (Patient not taking: Reported on 02/28/2020)   No facility-administered encounter medications on file as of 03/03/2020.    Allergies (verified) Patient has no known allergies.   History: Past Medical History:  Diagnosis Date  . Arthritis   . Attention deficit disorder (ADD) in  adult    Adderall 24m tablet- half tablet twice a day.   . Chicken pox   . Colon polyp    dad died of colon cancer- q5 year colonoscopy. 2018  . Encounter for blood transfusion    related to overtreatment with PCN. became very ill.   .[name] KitchenHeart murmur    Sees cardiology October each year due to murmur after fall- get records. Seen for heart murmur heard 2010. We discused may not need to see cardiology. Every other year stres test  . Hyperglycemia    a1c 5.9 in 03/2017  . Hyperlipidemia    atorvastatin 470m . Hypertension    losartan 50, hctz 12.41m26m. Hypothyroidism    levothyroxine 100 mcg 6 days a week as of mid 2019.   . Seasonal allergies    prn antihistamine   Past Surgical History:  Procedure Laterality Date  . Arthroscopic surgery Left 1986-1996   Damaged cartilage Left knee  . BACK SURGERY Bilateral 2008   Injury due to a fall. 2 fractured vertebrae, 3 fractured ribs fracture L scapulla, mild concussion  . CHOLECYSTECTOMY  2014  . Relocation surgery Right    For right elbow ulna nerve   . REPAIR of torn retina Right 2014  . TONSILECTOMY, ADENOIDECTOMY, BILATERAL MYRINGOTOMY AND TUBES  1958  . vena Laser   2009   Remaoval of varicous veins   Family History  Problem Relation Age of Onset  . Arthritis Mother        passed at age 29 66 Miscarriages / [place]   . Hearing loss Father   . Colon cancer Father        died at 69 92 Other Daughter        diabetes educator  . Heart disease Paternal Grandmother   . Miscarriages / Sti[place]aughter   . Diabetes Mellitus I Daughter        [place]   Socioeconomic History  . Marital status: Married    Spouse name: Not on file  . Number of children: Not on file  . Years of education: Not on file  . Highest education level: Not on file  Occupational History  . Occupation: Retired   Tobacco Use  . Smoking status: Never Smoker  . Smokeless tobacco: Never Used  Vaping Use  . Vaping  Use: Never used  Substance and Sexual Activity  . Alcohol use: Not Currently  . Drug use: Never  . Sexual activity: Not Currently  Other Topics Concern  . Not on file  Social History Narrative   Married. Twin daughters. 4 grandkids (2 each)   [place]   Retired from engConservation officer, historic buildings   Hobbies: time with grandkids, enjoys travel   Social Determinants of [place] broadcast [place]: [place]. Difficulty of Paying Living Expenses: Not hard at all  Food Insecurity: No Food Insecurity  . Worried About RunCharity fundraiser the Last Year: Never true  . Ran Out of Food  in the Last Year: Never true  Transportation Needs: No Transportation Needs  . Lack of Transportation (Medical): No  . Lack of Transportation (Non-Medical): No  Physical Activity: Sufficiently Active  . Days of Exercise per Week: 6 days  . Minutes of Exercise per Session: 60 min  Stress: No Stress Concern Present  . Feeling of Stress : Not at all  Social Connections: Moderately Integrated  . Frequency of Communication with Friends and Family: More than three times a week  . Frequency of Social Gatherings with Friends and Family: Once a week  . Attends Religious Services: More than 4 times per year  . Active Member of Clubs or Organizations: No  . Attends Archivist Meetings: Never  . Marital Status: Married    Tobacco Counseling Counseling given: Not Answered   Clinical Intake:  Pre-visit preparation completed: Yes  Pain : No/denies pain     BMI - recorded: 23.07 Nutritional Status: BMI of 19-24  Normal Nutritional Risks: None Diabetes: No  How often do you need to have someone help you when you read instructions, pamphlets, or other written materials from your doctor or pharmacy?: 1 - Never  Diabetic?No  Interpreter Needed?: No  Information entered by :: Charlott Rakes, LPN   Activities of Daily Living In your present state of  health, do you have any difficulty performing the following activities: 03/03/2020 12/28/2019  Hearing? N Y  Vision? N N  Difficulty concentrating or making decisions? N Y  Walking or climbing stairs? N N  Dressing or bathing? N N  Doing errands, shopping? N N  Preparing Food and eating ? N -  Using the Toilet? N -  In the past six months, have you accidently leaked urine? N -  Do you have problems with loss of bowel control? N -  Managing your Medications? N -  Managing your Finances? N -  Housekeeping or managing your Housekeeping? N -  Some recent data might be hidden    Patient Care Team: [name], MD as PCP - General (Family Medicine) [facility] as Consulting Physician Croitoru, Dani Gobble, MD as Consulting Physician (Cardiology) Hayden Pedro, MD as Consulting Physician (Ophthalmology) Madelin Rear, [facility] (Pharmacist) Madelin Rear, [facility] as Pharmacist (Pharmacist)  Indicate any recent Medical Services you may have received from other than [facility] providers in the past year (date may be approximate).     Assessment:   This is a routine wellness examination for Darius Long.  Hearing/Vision screen  Hearing Screening   125Hz 250Hz 500Hz 1000Hz 2000Hz 3000Hz 4000Hz 6000Hz 8000Hz  Right ear:           Left ear:           Comments: Pt denies any hearing   Vision Screening Comments: Pt follows [facility] Fillers and dr Zigmund Daniel for annual eye care   Dietary issues and exercise activities discussed: Current Exercise Habits: Home exercise routine, Type of exercise: stretching;walking, Time (Minutes): 60, Frequency (Times/Week): 6, Weekly Exercise (Minutes/Week): 360  Goals    . Patient Stated     Maintain Blood pressure and thyroid    . Track and Manage My Blood Pressure-Hypertension     Timeframe:  Long-Range Goal Priority:  High Start Date:  02/28/2020            Expected End Date: 08/27/2020  Follow Up Date 08/27/2020    - check blood pressure daily -  write blood pressure results in a log or diary  Why is this important?    You won't feel high blood pressure, but it can still hurt your blood vessels.   High blood pressure can cause heart or kidney problems. It can also cause a stroke.   Making lifestyle changes like losing a little weight or eating less salt will help.   Checking your blood pressure at home and at different times of the day can help to control blood pressure.   If the doctor prescribes medicine remember to take it the way the doctor ordered.   Call the office if you cannot afford the medicine or if there are questions about it.     Notes:       Depression Screen PHQ 2/9 Scores 03/03/2020 02/02/2020 07/09/2019 02/05/2018 01/01/2018 10/23/2017  PHQ - 2 Score 0 0 0 1 1 0  PHQ- 9 Score - - - 3 4 -    Fall Risk Fall Risk  03/03/2020 12/28/2019 12/16/2018 12/01/2018 10/23/2017  Falls in the past year? 0 0 0 0 No  Comment - - Emmi Telephone Survey: data to providers prior to load - -  Number falls in past yr: 0 0 - - -  Injury with Fall? 0 0 - 0 -  Risk for fall due to : Impaired vision - - - -  Follow up Falls prevention discussed - - Falls evaluation completed;Education provided;Falls prevention discussed -    FALL RISK PREVENTION PERTAINING TO THE HOME:  Any stairs in or around the home? Yes  If so, are there any without handrails? No  Home free of loose throw rugs in walkways, pet beds, electrical cords, etc? Yes  Adequate lighting in your home to reduce risk of falls? Yes   ASSISTIVE DEVICES UTILIZED TO PREVENT FALLS:  Life alert? No  Use of a cane, walker or w/c? No  Grab bars in the bathroom? Yes  Shower chair or bench in shower? Yes  Elevated toilet seat or a handicapped toilet? Yes   TIMED UP AND GO:  Was the test performed? No .    Cognitive Function:     6CIT Screen 03/03/2020 12/01/2018  What Year? 0 points 0 points  What month? 0 points 0 points  What time? - 0 points  Count back from  20 0 points 0 points  Months in reverse 0 points 0 points  Repeat phrase 0 points 0 points  Total Score - 0    Immunizations Immunization History  Administered Date(s) Administered  . Fluad Quad(high Dose 65+) 09/22/2018, 10/13/2019  . Influenza, High Dose Seasonal PF 10/10/2017  . MMR 04/14/2013  . PFIZER(Purple Top)SARS-COV-2 Vaccination 02/28/2019, 03/25/2019, 10/22/2019  . Pneumococcal Conjugate-13 07/01/2013  . Pneumococcal Polysaccharide-23 01/03/2012  . Tdap 05/17/2013  . Zoster 08/02/2010  . Zoster Recombinat (Shingrix) 10/12/2017, 02/17/2018    TDAP status: Up to date  Flu Vaccine status: Up to date done 10/13/19  Pneumococcal vaccine status: Up to date  Covid-19 vaccine status: Completed vaccines  Qualifies for Shingles Vaccine? Yes   Zostavax completed Yes   Shingrix Completed?: Yes  Screening Tests Health Maintenance  Topic Date Due  . COLONOSCOPY (Pts 45-53yr Insurance coverage will need to be confirmed)  11/12/2021  . TETANUS/TDAP  05/18/2023  . INFLUENZA VACCINE  Completed  . COVID-19 Vaccine  Completed  . Hepatitis C Screening  Completed  . PNA vac Low Risk Adult  Completed    Health Maintenance  There are no preventive care reminders to display for this patient.  Colorectal cancer  screening: Type of screening: Colonoscopy. Completed 11/12/16. Repeat every 5 years    Additional Screening:  Hepatitis C Screening:  Completed 12/28/19  Vision Screening: Recommended annual ophthalmology exams for early detection of glaucoma and other disorders of the eye. Is the patient up to date with their annual eye exam?  Yes  Who is the provider or what is the name of the office in which the patient attends annual eye exams? Dr Katy Fitch and Dr Zigmund Daniel If pt is not established with a provider, would they like to be referred to a provider to establish care? No .   Dental Screening: Recommended annual dental exams for proper oral hygiene  Community Resource  Referral / Chronic Care Management: CRR required this visit?  No   CCM required this visit?  No      Plan:     I have personally reviewed and noted the following in the patient's chart:   . Medical and social history . Use of alcohol, tobacco or illicit drugs  . Current medications and supplements . Functional ability and status . Nutritional status . Physical activity . Advanced directives . List of other physicians . Hospitalizations, surgeries, and ER visits in previous 12 months . Vitals . Screenings to include cognitive, depression, and falls . Referrals and appointments  In addition, I have reviewed and discussed with patient certain preventive protocols, quality metrics, and best practice recommendations. A written personalized care plan for preventive services as well as general preventive health recommendations were provided to patient.     Willette Brace, LPN   [id number]   Nurse Notes: None

## 2020-03-07 ENCOUNTER — Other Ambulatory Visit: Payer: Self-pay | Admitting: Family Medicine

## 2020-03-07 NOTE — Progress Notes (Signed)
Phone [phone] In person visit   Subjective:   Darius Long. is a 75 y.o. year old very pleasant male patient who presents for/with See problem oriented charting Chief Complaint  Patient presents with  . Hypertension    valsartan 320mg     This visit occurred during the SARS-CoV-2 public health emergency.  Safety protocols were in place, including screening questions prior to the visit, additional usage of staff PPE, and extensive cleaning of exam room while observing appropriate contact time as indicated for disinfecting solutions.   Past Medical History-  Patient Active Problem List   Diagnosis Date Noted  . OCD (obsessive compulsive disorder) 01/01/2018    Priority: High  . Attention deficit disorder (ADD) in adult     Priority: High  . Heart murmur     Priority: High  . Arthritis 07/07/2017    Priority: Medium  . Hypertension     Priority: Medium  . Hyperlipidemia     Priority: Medium  . Hypothyroidism     Priority: Medium  . Hyperglycemia     Priority: Medium  . Strain of right tibialis anterior muscle 10/30/2017    Priority: Low  . Seasonal allergies     Priority: Low  . Colon polyp     Priority: Low    Medications- reviewed and updated Current Outpatient Medications  Medication Sig Dispense Refill  . atorvastatin (LIPITOR) 40 MG tablet TAKE ONE TABLET BY MOUTH DAILY 90 tablet 1  . Black Pepper-Turmeric (TURMERIC CURCUMIN) 05-998 MG CAPS Take 1 capsule by mouth daily.    . Calcium Citrate-Vitamin D (CITRACAL + D PO) Take 25 mcg by mouth daily.    . CHONDROITIN SULFATE PO Take 200 mg by mouth daily.    . diclofenac Sodium (VOLTAREN) 1 % GEL APPLY 2 GRAMS TO THE AFFECTED AREA(S) EVERY NIGHT AT BEDTIME 100 g 2  . escitalopram (LEXAPRO) 10 MG tablet Take 1 tablet (10 mg total) by mouth daily. 90 tablet 3  . Glucosamine HCl 1500 MG TABS Take 1,500 mg by mouth daily.    [name] Kitchen Hyaluronic Acid-Vitamin C (HYALURONIC ACID PO) Take 3.3 mg by mouth daily.    .  hydrochlorothiazide (MICROZIDE) 12.5 MG capsule Take 1 capsule (12.5 mg total) by mouth daily. 90 capsule 3  . latanoprost (XALATAN) 0.005 % ophthalmic solution Place 1 drop into both eyes at bedtime.    . Omega-3 Fatty Acids (OMEGA-3 FISH OIL PO) Take by mouth.    . Probiotic Product (PROBIOTIC ADVANCED PO) Take 2 tablets by mouth daily.    . valsartan (DIOVAN) 320 MG tablet Take 1 tablet (320 mg total) by mouth daily. 90 tablet 3  . vitamin E 400 UNIT capsule Take 800 Units by mouth daily.     No current facility-administered medications for this visit.     Objective:  BP 128/64   Pulse 84   Temp 97.6 F (36.4 C) (Temporal)   Ht 5\' 9"  (1.753 m)   Wt 157 lb 3.2 oz (71.3 kg)   SpO2 96%   BMI 23.21 kg/m  Gen: NAD, resting comfortably CV: RRR stable murmur Lungs: CTAB no crackles, wheeze, rhonchi Ext: no edema Skin: warm, dry    Assessment and Plan   #hypertension S: medication: valstartan 320mg  (changed last visit from losartan 100mg  but reflecting back was also hyperthyroid) , hctz 12.5 mg  Home readings #s:  Home average 536 systolic (average of 3 daily checks since feb 5) with diastolic <64 BP Readings from Last  3 Encounters:  03/08/20 128/64  02/02/20 138/80  12/28/19 (!) 144/74  A/P: much better on valsartan 320 mg (plus off thyroid medicine and was hyperthyroid which may have contributed) and hctz 12.5mg . continue current meds  #hypothyroidism S: compliant On thyroid medication- off thyroid medicine for at least 4 weeks at this point  Prior weight loss likely related to this- weight is improving at this point.  Lab Results  Component Value Date   TSH <0.01 Repeated and verified X2. (L) 02/02/2020  A/P:poor control on last chec/hyperthyroid- will see how he is doing off meds. May need endocrine input if still hyperthyroid/further workup   #hyperlipidemia- baseline LDL not known- all values we have are with statin- peak available 114. Mild AST elevation S:  Medication:atorvastatin 40mg    Slight  AST elevation intermittently for several years- AST looking back was typically in 40s. Glass of wine in past- in last 6 months has had none Lab Results  Component Value Date   CHOL 147 07/07/2019   HDL 49.90 07/07/2019   LDLCALC 86 07/07/2019   LDLDIRECT 89.0 08/18/2018   TRIG 55.0 07/07/2019   CHOLHDL 3 07/07/2019   A/P: Stable. Continue current medications. Chronic ast elevation even without alcohol- continue to trend  Recommended follow up: keep June visit Future Appointments  Date Time Provider [place]  07/13/2020  4:00 PM [name], MD [facility]  08/30/2020  1:30 PM [facility] CCM PHARMACIST [facility]  08/31/2020  9:15 AM Hayden Pedro, MD [facility] None  03/09/2021  1:00 PM [facility] HEALTH COACH [facility]    Lab/Order associations:   ICD-10-CM   1. Hypothyroidism, unspecified type  E03.9 TSH    T4, free    T3, free  2. Primary hypertension  I10 Comprehensive metabolic panel    No orders of the defined types were placed in this encounter.    Return precautions advised.  [name], MD

## 2020-03-07 NOTE — Patient Instructions (Addendum)
Please stop by lab before you go If you have mychart- we will send your results within 3 business days of [place] receiving them.  If you do not have mychart- we will call you about results within 5 business days of [place] receiving them.  *please also note that you will see labs on mychart as soon as they post. I will later go in and write notes on them- will say "notes from Dr. Yong Channel"  No changes today unless labs lead [place] to make changes (thyroid is at top of my list depending on results)   Keep physical appointment later this year- look forward to seeing you then

## 2020-03-08 ENCOUNTER — Ambulatory Visit (INDEPENDENT_AMBULATORY_CARE_PROVIDER_SITE_OTHER): Payer: PPO | Admitting: Family Medicine

## 2020-03-08 ENCOUNTER — Encounter: Payer: Self-pay | Admitting: Family Medicine

## 2020-03-08 ENCOUNTER — Other Ambulatory Visit: Payer: Self-pay

## 2020-03-08 ENCOUNTER — Ambulatory Visit: Payer: PPO | Admitting: Family Medicine

## 2020-03-08 VITALS — BP 128/64 | HR 84 | Temp 97.6°F | Ht 69.0 in | Wt 157.2 lb

## 2020-03-08 DIAGNOSIS — E785 Hyperlipidemia, unspecified: Secondary | ICD-10-CM

## 2020-03-08 DIAGNOSIS — E039 Hypothyroidism, unspecified: Secondary | ICD-10-CM

## 2020-03-08 DIAGNOSIS — I10 Essential (primary) hypertension: Secondary | ICD-10-CM | POA: Diagnosis not present

## 2020-03-08 LAB — TSH: TSH: <0.01 u[IU]/mL — ABNORMAL LOW (ref 0.35–4.50)

## 2020-03-08 LAB — COMPREHENSIVE METABOLIC PANEL
ALT: 51 U/L (ref 0–53)
AST: 33 U/L (ref 0–37)
Albumin: 3.9 g/dL (ref 3.5–5.2)
Alkaline Phosphatase: 71 U/L (ref 39–117)
BUN: 25 mg/dL — ABNORMAL HIGH (ref 6–23)
CO2: 29 mEq/L (ref 19–32)
Calcium: 9.2 mg/dL (ref 8.4–10.5)
Chloride: 101 mEq/L (ref 96–112)
Creatinine, Ser: 0.7 mg/dL (ref 0.40–1.50)
GFR: 90.12 mL/min (ref >60.00)
Glucose, Bld: 102 mg/dL — ABNORMAL HIGH (ref 70–99)
Potassium: 4.2 mEq/L (ref 3.5–5.1)
Sodium: 138 mEq/L (ref 135–145)
Total Bilirubin: 0.7 mg/dL (ref 0.2–1.2)
Total Protein: 6.5 g/dL (ref 6.0–8.3)

## 2020-03-08 LAB — T3, FREE: T3, Free: 5.5 pg/mL — ABNORMAL HIGH (ref 2.3–4.2)

## 2020-03-08 LAB — T4, FREE: Free T4: 1.65 ng/dL — ABNORMAL HIGH (ref 0.60–1.60)

## 2020-03-08 NOTE — Assessment & Plan Note (Signed)
S: compliant On thyroid medication- off thyroid medicine for at least 4 weeks at this point  Prior weight loss likely related to this- weight is improving at this point.  Lab Results  Component Value Date   TSH <0.01 Repeated and verified X2. (L) 02/02/2020  A/P:poor control on last chec/hyperthyroid- will see how he is doing off meds. May need endocrine input if still hyperthyroid/further workup

## 2020-03-08 NOTE — Assessment & Plan Note (Signed)
S: medication: valstartan 320mg  (changed last visit from losartan 100mg  but reflecting back was also hyperthyroid) , hctz 12.5 mg  Home readings #s:  Home average 257 systolic (average of 3 daily checks since feb 5) with diastolic <50 BP Readings from Last 3 Encounters:  03/08/20 (!) 148/64  02/02/20 138/80  12/28/19 (!) 144/74  A/P: much better on valsartan 320 mg (plus off thyroid medicine and was hyperthyroid which may have contributed) and hctz 12.5mg . continue current meds

## 2020-03-13 ENCOUNTER — Other Ambulatory Visit: Payer: Self-pay

## 2020-03-13 DIAGNOSIS — E0590 Thyrotoxicosis, unspecified without thyrotoxic crisis or storm: Secondary | ICD-10-CM

## 2020-03-14 ENCOUNTER — Other Ambulatory Visit: Payer: Self-pay

## 2020-03-15 ENCOUNTER — Ambulatory Visit: Payer: PPO | Admitting: Internal Medicine

## 2020-03-15 ENCOUNTER — Encounter: Payer: Self-pay | Admitting: Internal Medicine

## 2020-03-15 VITALS — BP 140/70 | HR 60 | Ht 69.0 in | Wt 156.1 lb

## 2020-03-15 DIAGNOSIS — E0590 Thyrotoxicosis, unspecified without thyrotoxic crisis or storm: Secondary | ICD-10-CM

## 2020-03-15 LAB — T4, FREE: Free T4: 1.89 ng/dL — ABNORMAL HIGH (ref 0.60–1.60)

## 2020-03-15 LAB — TSH: TSH: <0.01 u[IU]/mL — ABNORMAL LOW (ref 0.35–4.50)

## 2020-03-15 MED ORDER — METHIMAZOLE 5 MG PO TABS: 5.0000 mg | ORAL_TABLET | Freq: Two times a day (BID) | ORAL | 6 refills | Status: DC

## 2020-03-15 NOTE — Patient Instructions (Signed)
We recommend that you follow these hyperthyroidism instructions at home:  1) Take Methimazole 5 mg , 1 tablet twice  a day  If you develop severe sore throat with high fevers OR develop unexplained yellowing of your skin, eyes, under your tongue, severe abdominal pain with nausea or vomiting --> then please get evaluated immediately.   2) Get repeat thyroid labs 6 weeks .   It is ESSENTIAL to get follow-up labs to help avoid over or undertreatment of your hyperthyroidism - both of which can be dangerous to your health.

## 2020-03-15 NOTE — Progress Notes (Unsigned)
Name: Darius Long.  MRN/ DOB: [id number], 06/16/1945    Age/ Sex: 75 y.o., male    PCP: [name], MD   Reason for Endocrinology Evaluation: Hyperthyroidism      Date of Initial Endocrinology Evaluation: 03/15/2020     HPI: [name] is a 75 y.o. male with a past medical history of HTN, dyslipidemia and OCD. The patient presented for initial endocrinology clinic visit on 03/15/2020 for consultative assistance with his Hyperthyroidism   Pt is accompanied by his wife today  They moved from [facility], [place] in May 2019    He was diagnosed with hypothyroidism ~ 15 yrs ago and was on LT-4  Until 02/03/2019   Over the summer/fall  of 2021 he has noted weight loss as well as decrease muscle mass. His LT-4 replacement has been gradually reduced until fully discontinued  On 02/03/2019  No amiodarone use  No recent contrast administration No prior sx on neck or radiation   He follows with Dr. Midge Aver for elevated eye pressure last exam 12/2019, next follow up 07/2020  His weight has been stable since stopping the levothyroxine  Denies palpitations or diarrhea  Anxiety issues stable   No local neck symptoms   No FH of thyroid disease    HISTORY:  Past Medical History:  Past Medical History:  Diagnosis Date  . Arthritis   . Attention deficit disorder (ADD) in adult    Adderall 30mg  tablet- half tablet twice a day.   . Chicken pox   . Colon polyp    dad died of colon cancer- q5 year colonoscopy. 2018  . Encounter for blood transfusion    related to overtreatment with PCN. became very ill.   [name] Kitchen Heart murmur    Sees cardiology October each year due to murmur after fall- get records. Seen for heart murmur heard 2010. We discused may not need to see cardiology. Every other year stres test  . Hyperglycemia    a1c 5.9 in 03/2017  . Hyperlipidemia    atorvastatin 40mg   . Hypertension    losartan 50, hctz 12.5mg   . Hypothyroidism     levothyroxine 100 mcg 6 days a week as of mid 2019.   . Seasonal allergies    prn antihistamine    Past Surgical History:  Past Surgical History:  Procedure Laterality Date  . Arthroscopic surgery Left 1986-1996   Damaged cartilage Left knee  . BACK SURGERY Bilateral 2008   Injury due to a fall. 2 fractured vertebrae, 3 fractured ribs fracture L scapulla, mild concussion  . CHOLECYSTECTOMY  2014  . Relocation surgery Right    For right elbow ulna nerve   . REPAIR of torn retina Right 2014  . TONSILECTOMY, ADENOIDECTOMY, BILATERAL MYRINGOTOMY AND TUBES  1958  . vena Laser   2009   Remaoval of varicous veins      Social History:  reports that he has never smoked. He has never used smokeless tobacco. He reports previous alcohol use. He reports that he does not use drugs.  Family History: family history includes Arthritis in his mother; Colon cancer in his father; Diabetes Mellitus I in his daughter; Hearing loss in his father; Heart disease in his paternal grandmother; Miscarriages / [place] in his daughter and mother; Other in his daughter.   HOME MEDICATIONS: Allergies as of 03/15/2020   No Known Allergies     Medication List       Accurate as  of March 15, 2020  7:05 AM. If you have any questions, ask your nurse or doctor.        atorvastatin 40 MG tablet Commonly known as: LIPITOR TAKE ONE TABLET BY MOUTH DAILY   CHONDROITIN SULFATE PO Take 200 mg by mouth daily.   CITRACAL + D PO Take 25 mcg by mouth daily.   diclofenac Sodium 1 % Gel Commonly known as: VOLTAREN APPLY 2 GRAMS TO THE AFFECTED AREA(S) EVERY NIGHT AT BEDTIME   escitalopram 10 MG tablet Commonly known as: LEXAPRO Take 1 tablet (10 mg total) by mouth daily.   Glucosamine HCl 1500 MG Tabs Take 1,500 mg by mouth daily.   HYALURONIC ACID PO Take 3.3 mg by mouth daily.   hydrochlorothiazide 12.5 MG capsule Commonly known as: MICROZIDE Take 1 capsule (12.5 mg total) by mouth daily.    latanoprost 0.005 % ophthalmic solution Commonly known as: XALATAN Place 1 drop into both eyes at bedtime.   OMEGA-3 FISH OIL PO Take by mouth.   PROBIOTIC ADVANCED PO Take 2 tablets by mouth daily.   Turmeric Curcumin 05-998 MG Caps Take 1 capsule by mouth daily.   valsartan 320 MG tablet Commonly known as: DIOVAN Take 1 tablet (320 mg total) by mouth daily.   vitamin E 180 MG (400 UNITS) capsule Take 800 Units by mouth daily.         REVIEW OF SYSTEMS: A comprehensive ROS was conducted with the patient and is negative except as per HPI     OBJECTIVE:  VS: BP 140/70   Pulse 60   Ht 5\' 9"  (1.753 m)   Wt 156 lb 2 oz (70.8 kg)   SpO2 98%   BMI 23.06 kg/m    Wt Readings from Last 3 Encounters:  03/08/20 157 lb 3.2 oz (71.3 kg)  02/02/20 156 lb 3.2 oz (70.9 kg)  12/28/19 154 lb 12.8 oz (70.2 kg)     EXAM: General: Pt appears well and is in NAD  Eyes: External eye exam normal without stare, lid lag or exophthalmos.  EOM intact.   Neck: General: Supple without adenopathy. Thyroid: Thyroid size normal.  No goiter or nodules appreciated. No thyroid bruit.  Lungs: Clear with good BS bilat with no rales, rhonchi, or wheezes  Heart: Auscultation: RRR.  Abdomen: Normoactive bowel sounds, soft, nontender, without masses or organomegaly palpable  Extremities:  BL LE: No pretibial edema normal ROM and strength.  Skin: Hair: Texture and amount normal with gender appropriate distribution Skin Inspection: No rashes Skin Palpation: Skin temperature, texture, and thickness normal to palpation  Mental Status: Judgment, insight: Intact Orientation: Oriented to time, place, and person Mood and affect: No depression, anxiety, or agitation     DATA REVIEWED: Results for HOOVER, GREWE (MRN [id number]) as of 03/16/2020 12:52  Ref. Range 03/15/2020 12:16  TSH Latest Ref Range: 0.35 - 4.50 uIU/mL <0.01 (L)  T4,Free(Direct) Latest Ref Range: 0.60 - 1.60 ng/dL 1.89 (H)     Results for JAVAUN, DIMPERIO (MRN [id number]) as of 03/16/2020 12:52  Ref. Range 03/08/2020 10:55  Sodium Latest Ref Range: 135 - 145 mEq/L 138  Potassium Latest Ref Range: 3.5 - 5.1 mEq/L 4.2  Chloride Latest Ref Range: 96 - 112 mEq/L 101  CO2 Latest Ref Range: 19 - 32 mEq/L 29  Glucose Latest Ref Range: 70 - 99 mg/dL 102 (H)  BUN Latest Ref Range: 6 - 23 mg/dL 25 (H)  Creatinine Latest Ref Range: 0.40 - 1.50  mg/dL 0.70  Calcium Latest Ref Range: 8.4 - 10.5 mg/dL 9.2  Alkaline Phosphatase Latest Ref Range: 39 - 117 U/L 71  Albumin Latest Ref Range: 3.5 - 5.2 g/dL 3.9  AST Latest Ref Range: 0 - 37 U/L 33  ALT Latest Ref Range: 0 - 53 U/L 51  Total Protein Latest Ref Range: 6.0 - 8.3 g/dL 6.5  Total Bilirubin Latest Ref Range: 0.2 - 1.2 mg/dL 0.7  GFR Latest Ref Range: >60.00 mL/min 90.12    ASSESSMENT/PLAN/RECOMMENDATIONS:   1. Hyperthyroidism:  - Clinically he is euthyroid at this time - No local neck symptoms  - He was on LT-4 replacement for approximately 15 years prior to developing a suppressed TSH, repeat TFTs today continue to show hyperthyroidism, I doubt that LT-4 replacement has an effect as he has been off of it for approximately 5 weeks  -We discussed the differential diagnosis to include Graves' disease, toxic thyroid nodule, and less likely subacute thyroiditis.  .We discussed that Graves' Disease is a result of an autoimmune condition involving the thyroid.    We discussed with pt the benefits of methimazole in the Tx of hyperthyroidism, as well as the possible side effects/complications of anti-thyroid drug Tx (specifically detailing the rare, but serious side effect of agranulocytosis). He was informed of need for regular thyroid function monitoring while on methimazole to ensure appropriate dosage without over-treatment. As well, we discussed the possible side effects of methimazole including the chance of rash, the small chance of liver irritation/juandice  and the <=1 in 300-400 chance of sudden onset agranulocytosis.  We discussed importance of going to ED promptly (and stopping methimazole) if hewere to develop significant fever with severe sore throat of other evidence of acute infection.     We extensively discussed the various treatment options for hyperthyroidism and Graves disease including ablation therapy with radioactive iodine versus antithyroid drug treatment versus surgical therapy.  We recommended to the patient that we felt, at this time, that thionamide therapy would be most optimal.  We discussed the various possible benefits versus side effects of the various therapies.   I carefully explained to the patient that one of the consequences of I-131 ablation treatment would likely be permanent hypothyroidism which would require long-term replacement therapy with LT4.  -We will check TRAb levels    Medications : Start methimazole 5 mg twice daily    Follow-up in 3 months Labs in 6 weeks  Signed electronically by: Mack Guise, MD  [facility] End[facility]  [facility] [address] [place], [place] [address] Phone: [phone] FAX: [phone]   CC: [name], [place] [address] Phone: [phone] Fax: [phone]   Return to Endocrinology clinic as below: Future Appointments  Date Time Provider [place]  03/15/2020 11:30 AM [name], [name], MD [facility] None  07/13/2020  4:00 PM [name], MD [facility]  08/30/2020  1:30 PM [facility] CCM PHARMACIST [facility]  08/31/2020  9:15 AM Hayden Pedro, MD [facility] None  03/09/2021  1:00 PM [facility] HEALTH COACH [facility]

## 2020-03-16 DIAGNOSIS — E0590 Thyrotoxicosis, unspecified without thyrotoxic crisis or storm: Secondary | ICD-10-CM | POA: Insufficient documentation

## 2020-03-18 ENCOUNTER — Other Ambulatory Visit: Payer: Self-pay | Admitting: Family Medicine

## 2020-03-19 LAB — TRAB (TSH RECEPTOR BINDING ANTIBODY): TRAB: 18.65 IU/L — ABNORMAL HIGH (ref <=2.00)

## 2020-03-20 ENCOUNTER — Other Ambulatory Visit: Payer: Self-pay | Admitting: Family Medicine

## 2020-03-22 ENCOUNTER — Encounter: Payer: Self-pay | Admitting: Internal Medicine

## 2020-03-23 ENCOUNTER — Other Ambulatory Visit: Payer: Self-pay | Admitting: Family Medicine

## 2020-03-25 ENCOUNTER — Encounter: Payer: Self-pay | Admitting: Internal Medicine

## 2020-03-25 ENCOUNTER — Other Ambulatory Visit: Payer: Self-pay | Admitting: Family Medicine

## 2020-03-27 ENCOUNTER — Other Ambulatory Visit: Payer: PPO

## 2020-03-27 NOTE — Telephone Encounter (Signed)
Please advise 

## 2020-03-27 NOTE — Telephone Encounter (Signed)
I would suggest he waits, the whole healthcare system is struggling and changing the site may push him back further

## 2020-04-04 ENCOUNTER — Ambulatory Visit
Admission: RE | Admit: 2020-04-04 | Discharge: 2020-04-04 | Disposition: A | Discharge status: Home / Self Care | Payer: PPO | Source: Ambulatory Visit | Attending: Internal Medicine | Admitting: Internal Medicine

## 2020-04-04 ENCOUNTER — Ambulatory Visit: Payer: PPO | Admitting: Family Medicine

## 2020-04-04 DIAGNOSIS — E041 Nontoxic single thyroid nodule: Secondary | ICD-10-CM | POA: Diagnosis not present

## 2020-04-04 DIAGNOSIS — E0590 Thyrotoxicosis, unspecified without thyrotoxic crisis or storm: Secondary | ICD-10-CM

## 2020-04-12 DIAGNOSIS — M546 Pain in thoracic spine: Secondary | ICD-10-CM | POA: Diagnosis not present

## 2020-04-12 DIAGNOSIS — R531 Weakness: Secondary | ICD-10-CM | POA: Diagnosis not present

## 2020-04-12 DIAGNOSIS — M19072 Primary osteoarthritis, left ankle and foot: Secondary | ICD-10-CM | POA: Diagnosis not present

## 2020-04-12 DIAGNOSIS — M25552 Pain in left hip: Secondary | ICD-10-CM | POA: Diagnosis not present

## 2020-04-12 DIAGNOSIS — M9903 Segmental and somatic dysfunction of lumbar region: Secondary | ICD-10-CM | POA: Diagnosis not present

## 2020-04-12 DIAGNOSIS — M79672 Pain in left foot: Secondary | ICD-10-CM | POA: Diagnosis not present

## 2020-04-12 DIAGNOSIS — M9904 Segmental and somatic dysfunction of sacral region: Secondary | ICD-10-CM | POA: Diagnosis not present

## 2020-04-12 DIAGNOSIS — M9906 Segmental and somatic dysfunction of lower extremity: Secondary | ICD-10-CM | POA: Diagnosis not present

## 2020-04-12 DIAGNOSIS — M9905 Segmental and somatic dysfunction of pelvic region: Secondary | ICD-10-CM | POA: Diagnosis not present

## 2020-04-13 ENCOUNTER — Ambulatory Visit: Payer: PPO | Admitting: Internal Medicine

## 2020-04-23 ENCOUNTER — Encounter: Payer: Self-pay | Admitting: Internal Medicine

## 2020-04-24 ENCOUNTER — Encounter: Payer: Self-pay | Admitting: Family Medicine

## 2020-04-24 NOTE — Telephone Encounter (Signed)
PLease let the pt know that Dr. Yong Channel has sent him to me for hyperthyroidism. I will gladly answer any questions about thyroid . As far as questions concerning his Metformin/Sugar is going to have to go through his PCP as he is the one managing this and follow up on it.

## 2020-04-25 ENCOUNTER — Other Ambulatory Visit: Payer: Self-pay

## 2020-04-25 ENCOUNTER — Other Ambulatory Visit: Payer: Self-pay | Admitting: Internal Medicine

## 2020-04-25 ENCOUNTER — Other Ambulatory Visit (INDEPENDENT_AMBULATORY_CARE_PROVIDER_SITE_OTHER): Payer: PPO

## 2020-04-25 DIAGNOSIS — M9905 Segmental and somatic dysfunction of pelvic region: Secondary | ICD-10-CM | POA: Diagnosis not present

## 2020-04-25 DIAGNOSIS — E0590 Thyrotoxicosis, unspecified without thyrotoxic crisis or storm: Secondary | ICD-10-CM

## 2020-04-25 DIAGNOSIS — M25552 Pain in left hip: Secondary | ICD-10-CM | POA: Diagnosis not present

## 2020-04-25 DIAGNOSIS — M9904 Segmental and somatic dysfunction of sacral region: Secondary | ICD-10-CM | POA: Diagnosis not present

## 2020-04-25 DIAGNOSIS — M9903 Segmental and somatic dysfunction of lumbar region: Secondary | ICD-10-CM | POA: Diagnosis not present

## 2020-04-25 DIAGNOSIS — M9906 Segmental and somatic dysfunction of lower extremity: Secondary | ICD-10-CM | POA: Diagnosis not present

## 2020-04-25 DIAGNOSIS — R531 Weakness: Secondary | ICD-10-CM | POA: Diagnosis not present

## 2020-04-25 LAB — T4, FREE: Free T4: 0.61 ng/dL (ref 0.60–1.60)

## 2020-04-25 LAB — TSH: TSH: <0.01 u[IU]/mL — ABNORMAL LOW (ref 0.35–4.50)

## 2020-04-25 MED ORDER — METFORMIN HCL 500 MG PO TABS: 250.0000 mg | ORAL_TABLET | Freq: Every day | ORAL | 3 refills | Status: DC

## 2020-06-06 DIAGNOSIS — M79672 Pain in left foot: Secondary | ICD-10-CM | POA: Diagnosis not present

## 2020-06-06 DIAGNOSIS — M2021 Hallux rigidus, right foot: Secondary | ICD-10-CM | POA: Diagnosis not present

## 2020-06-06 DIAGNOSIS — M9904 Segmental and somatic dysfunction of sacral region: Secondary | ICD-10-CM | POA: Diagnosis not present

## 2020-06-06 DIAGNOSIS — M25552 Pain in left hip: Secondary | ICD-10-CM | POA: Diagnosis not present

## 2020-06-06 DIAGNOSIS — M9905 Segmental and somatic dysfunction of pelvic region: Secondary | ICD-10-CM | POA: Diagnosis not present

## 2020-06-06 DIAGNOSIS — M9903 Segmental and somatic dysfunction of lumbar region: Secondary | ICD-10-CM | POA: Diagnosis not present

## 2020-06-06 DIAGNOSIS — G2581 Restless legs syndrome: Secondary | ICD-10-CM | POA: Diagnosis not present

## 2020-06-06 DIAGNOSIS — R531 Weakness: Secondary | ICD-10-CM | POA: Diagnosis not present

## 2020-06-06 DIAGNOSIS — M9906 Segmental and somatic dysfunction of lower extremity: Secondary | ICD-10-CM | POA: Diagnosis not present

## 2020-06-08 ENCOUNTER — Other Ambulatory Visit: Payer: Self-pay | Admitting: Internal Medicine

## 2020-06-08 MED ORDER — METHIMAZOLE 5 MG PO TABS: 5.0000 mg | ORAL_TABLET | Freq: Two times a day (BID) | ORAL | 1 refills | Status: DC

## 2020-06-10 ENCOUNTER — Other Ambulatory Visit: Payer: Self-pay | Admitting: Family Medicine

## 2020-06-12 ENCOUNTER — Other Ambulatory Visit: Payer: Self-pay

## 2020-06-12 ENCOUNTER — Encounter: Payer: Self-pay | Admitting: Internal Medicine

## 2020-06-12 ENCOUNTER — Ambulatory Visit: Payer: PPO | Admitting: Internal Medicine

## 2020-06-12 VITALS — BP 136/66 | HR 62 | Ht 69.0 in | Wt 174.2 lb

## 2020-06-12 DIAGNOSIS — E0500 Thyrotoxicosis with diffuse goiter without thyrotoxic crisis or storm: Secondary | ICD-10-CM

## 2020-06-12 DIAGNOSIS — E0590 Thyrotoxicosis, unspecified without thyrotoxic crisis or storm: Secondary | ICD-10-CM

## 2020-06-12 LAB — TSH: TSH: 1.21 u[IU]/mL (ref 0.35–4.50)

## 2020-06-12 LAB — T4, FREE: Free T4: 0.43 ng/dL — ABNORMAL LOW (ref 0.60–1.60)

## 2020-06-12 MED ORDER — METHIMAZOLE 5 MG PO TABS: 7.5000 mg | ORAL_TABLET | Freq: Every day | ORAL | 1 refills | Status: DC

## 2020-06-12 NOTE — Progress Notes (Signed)
Name: Darius Long.  MRN/ DOB: [id number], Jul 17, 1945    Age/ Sex: 75 y.o., male     PCP: [name], MD   Reason for Endocrinology Evaluation: .Hyperthyroidism     Initial Endocrinology Clinic Visit: 03/15/2020    PATIENT IDENTIFIER: Darius Long. is a 75 y.o., male with a past medical history of HTN, dyslipidemia and OCD. He has followed with [facility] since 03/15/2020 for consultative assistance with management of his Hyperthyroidism      HISTORICAL SUMMARY:  They moved from [facility], [place] in May 2019  He was diagnosed with hypothyroidism ~ 15 yrs ago and was on LT-4  Until 02/03/2019   Over the summer/fall  of 2021 he has noted weight loss as well as decrease muscle mass. His LT-4 replacement has been gradually reduced until fully discontinued  On 02/03/2019  No amiodarone use   Methimazole started 02/2020  No FH of thyroid disease    SUBJECTIVE:     Today (06/12/2020):  [name] is here for Hyperthyroidism.  He is accompanied by his spouse today   He has been noted with weight gain   Denies abdominal pain, nausea or vomiting  Denies fever   Sees opthalmology for glaucoma and hx of retinal detachment    Methimazole 5 mg BID   HISTORY:  Past Medical History:  Past Medical History:  Diagnosis Date  . Arthritis   . Attention deficit disorder (ADD) in adult    Adderall 30mg  tablet- half tablet twice a day.   . Chicken pox   . Colon polyp    dad died of colon cancer- q5 year colonoscopy. 2018  . Encounter for blood transfusion    related to overtreatment with PCN. became very ill.   [name] Kitchen Heart murmur    Sees cardiology October each year due to murmur after fall- get records. Seen for heart murmur heard 2010. We discused may not need to see cardiology. Every other year stres test  . Hyperglycemia    a1c 5.9 in 03/2017  . Hyperlipidemia    atorvastatin 40mg   . Hypertension    losartan 50, hctz 12.5mg   .  Hypothyroidism    levothyroxine 100 mcg 6 days a week as of mid 2019.   . Seasonal allergies    prn antihistamine   Past Surgical History:  Past Surgical History:  Procedure Laterality Date  . Arthroscopic surgery Left 1986-1996   Damaged cartilage Left knee  . BACK SURGERY Bilateral 2008   Injury due to a fall. 2 fractured vertebrae, 3 fractured ribs fracture L scapulla, mild concussion  . CHOLECYSTECTOMY  2014  . Relocation surgery Right    For right elbow ulna nerve   . REPAIR of torn retina Right 2014  . TONSILECTOMY, ADENOIDECTOMY, BILATERAL MYRINGOTOMY AND TUBES  1958  . vena Laser   2009   Remaoval of varicous veins    Social History:  reports that he has never smoked. He has never used smokeless tobacco. He reports previous alcohol use. He reports that he does not use drugs. Family History:  Family History  Problem Relation Age of Onset  . Arthritis Mother        passed at age 21  . Miscarriages / [place] Mother   . Hearing loss Father   . Colon cancer Father        died at 38  . Other Daughter        diabetes educator  .  Heart disease Paternal Grandmother   . Miscarriages / [place] Daughter   . Diabetes Mellitus I Daughter        in [place]: Allergies as of 06/12/2020   No Known Allergies     Medication List       Accurate as of Jun 12, 2020 10:00 AM. If you have any questions, ask your nurse or doctor.        atorvastatin 40 MG tablet Commonly known as: LIPITOR TAKE ONE TABLET BY MOUTH DAILY   CHONDROITIN SULFATE PO Take 200 mg by mouth daily.   CITRACAL + D PO Take 25 mcg by mouth daily.   diclofenac Sodium 1 % Gel Commonly known as: VOLTAREN APPLY 2 GRAMS TO THE AFFECTED AREA(S) EVERY NIGHT AT BEDTIME   escitalopram 10 MG tablet Commonly known as: LEXAPRO TAKE ONE TABLET BY MOUTH DAILY   Glucosamine HCl 1500 MG Tabs Take 1,500 mg by mouth daily.   HYALURONIC ACID PO Take 3.3 mg by mouth daily.    hydrochlorothiazide 12.5 MG capsule Commonly known as: MICROZIDE Take 1 capsule (12.5 mg total) by mouth daily.   latanoprost 0.005 % ophthalmic solution Commonly known as: XALATAN Place 1 drop into both eyes at bedtime.   metFORMIN 500 MG tablet Commonly known as: GLUCOPHAGE Take 0.5 tablets (250 mg total) by mouth daily with breakfast.   methimazole 5 MG tablet Commonly known as: TAPAZOLE Take 1 tablet (5 mg total) by mouth 2 (two) times daily.   OMEGA-3 FISH OIL PO Take by mouth.   OVER THE COUNTER MEDICATION Magtein   PROBIOTIC ADVANCED PO Take 2 tablets by mouth daily.   Turmeric Curcumin 05-998 MG Caps Take 1 capsule by mouth daily.   valsartan 320 MG tablet Commonly known as: DIOVAN Take 1 tablet (320 mg total) by mouth daily.   vitamin E 180 MG (400 UNITS) capsule Take 800 Units by mouth daily.         OBJECTIVE:   PHYSICAL EXAM: VS: BP 136/66   Pulse 62   Ht 5\' 9"  (1.753 m)   Wt 174 lb 4 oz (79 kg)   SpO2 98%   BMI 25.73 kg/m    EXAM: General: Pt appears well and is in NAD  Eyes: External eye exam normal without stare, lid lag or exophthalmos.  EOM intact  Neck: General: Supple without adenopathy. Thyroid: Thyroid size normal.  No goiter or nodules appreciated.   Lungs: Clear with good BS bilat with no rales, rhonchi, or wheezes  Heart: Auscultation: RRR.  Abdomen: Normoactive bowel sounds, soft, nontender, without masses or organomegaly palpable  Extremities:  BL LE: No pretibial edema normal ROM and strength.  Mental Status: Judgment, insight: Intact Orientation: Oriented to time, place, and person Mood and affect: No depression, anxiety, or agitation     DATA REVIEWED: Results for FELIS, QUILLIN (MRN [id number]) as of 06/12/2020 13:43  Ref. Range 06/12/2020 10:20  TSH Latest Ref Range: 0.35 - 4.50 uIU/mL 1.21  T4,Free(Direct) Latest Ref Range: 0.60 - 1.60 ng/dL 0.43 (L)    Results for ADEBAYO, Darius Long (MRN  [id number]) as of 06/12/2020 09:57  Ref. Range 03/15/2020 12:16  TRAB Latest Ref Range: <=2.00 IU/L 18.65 (H)   Thyroid ULtrasound 04/04/2020   Parenchymal Echotexture: Moderately heterogenous  Isthmus: 0.4 cm  Right lobe: 4.8 x 2.3 x 2.0 cm  Left lobe: 4.1 x 2.2 x 1.7 cm  _________________________________________________________  Estimated total number of nodules >/=  1 cm: 0  Number of spongiform nodules >/=  2 cm not described below (TR1): 0  Number of mixed cystic and solid nodules >/= 1.5 cm not described below ([place]): 0  _________________________________________________________  Nodule # 1:  Location: Left; Superior  Maximum size: 0.7 cm; Other 2 dimensions: 0.6 x 0.5 cm  Composition: solid/almost completely solid (2)  Echogenicity: isoechoic (1)  Shape: not taller-than-wide (0)  Margins: smooth (0)  Echogenic foci: none (0)  ACR TI-RADS total points: 3.  ACR TI-RADS risk category: TR3 (3 points).  ACR TI-RADS recommendations:  Given size (<1.4 cm) and appearance, this nodule does NOT meet TI-RADS criteria for biopsy or dedicated follow-up.  _________________________________________________________  IMPRESSION: 1. Moderately heterogeneous, normal-sized thyroid gland. 2. Single solid left upper thyroid nodule (labeled 1, 0.7 cm) which does not meet criteria (TI-RADS category 3) for dedicated ultrasound follow-up or tissue sampling.  The above is in keeping with the ACR TI-RADS recommendations - J Am Coll Radiol 2017;14:587-595.  Ruthann Cancer, MD  [facility]  [facility]   ASSESSMENT / PLAN / RECOMMENDATIONS:   1. Hyperthyroidism Secondary to Graves' Disease   -Patient is clinically euthyroid -No local neck symptoms -Tolerating methimazole without side effects -TSH is normal but free T4 is low, will adjust methimazole as below   Medications  Decrease methimazole  5 mg, to 1.5 tablets daily    2. Graves' Disease :  -No extrathyroidal manifestations of Graves' disease.  He is up-to-date on eye exams   Follow-up in 4 months Labs in 8 weeks   Signed electronically by: Mack Guise, MD  [facility]  [address] [place], [place] [address] Phone: [phone] FAX: [phone]      CC: [name], [address] [place] [address] Phone: [phone]  Fax: [phone]   Return to Endocrinology clinic as below: Future Appointments  Date Time Provider [place]  07/13/2020  4:00 PM [name], MD [facility]  08/30/2020  1:30 PM [facility] CCM PHARMACIST [facility]  08/31/2020  9:15 AM Hayden Pedro, MD [facility] None  03/09/2021  1:00 PM [facility] HEALTH COACH [facility]

## 2020-07-03 DIAGNOSIS — M25462 Effusion, left knee: Secondary | ICD-10-CM | POA: Diagnosis not present

## 2020-07-03 DIAGNOSIS — M25562 Pain in left knee: Secondary | ICD-10-CM | POA: Diagnosis not present

## 2020-07-13 ENCOUNTER — Other Ambulatory Visit: Payer: Self-pay

## 2020-07-13 ENCOUNTER — Ambulatory Visit (INDEPENDENT_AMBULATORY_CARE_PROVIDER_SITE_OTHER): Payer: PPO | Admitting: Family Medicine

## 2020-07-13 ENCOUNTER — Telehealth: Payer: Self-pay

## 2020-07-13 ENCOUNTER — Encounter: Payer: Self-pay | Admitting: Family Medicine

## 2020-07-13 VITALS — BP 136/72 | HR 52 | Temp 98.1°F | Ht 69.0 in | Wt 174.6 lb

## 2020-07-13 DIAGNOSIS — Z23 Encounter for immunization: Secondary | ICD-10-CM | POA: Diagnosis not present

## 2020-07-13 DIAGNOSIS — K635 Polyp of colon: Secondary | ICD-10-CM | POA: Diagnosis not present

## 2020-07-13 DIAGNOSIS — E0590 Thyrotoxicosis, unspecified without thyrotoxic crisis or storm: Secondary | ICD-10-CM | POA: Diagnosis not present

## 2020-07-13 DIAGNOSIS — Z0000 Encounter for general adult medical examination without abnormal findings: Secondary | ICD-10-CM

## 2020-07-13 DIAGNOSIS — R739 Hyperglycemia, unspecified: Secondary | ICD-10-CM | POA: Diagnosis not present

## 2020-07-13 DIAGNOSIS — I10 Essential (primary) hypertension: Secondary | ICD-10-CM | POA: Diagnosis not present

## 2020-07-13 DIAGNOSIS — R351 Nocturia: Secondary | ICD-10-CM

## 2020-07-13 DIAGNOSIS — E785 Hyperlipidemia, unspecified: Secondary | ICD-10-CM | POA: Diagnosis not present

## 2020-07-13 NOTE — Progress Notes (Signed)
Phone: [phone]   Subjective:  Patient presents today for their annual physical. Chief complaint-noted.   See problem oriented charting- ROS- full  review of systems was completed and negative  except for: left hip pain working with Dr. Paulla Fore- using voltaren gel nightly, stretching, working with Dr. Paulla Fore and doing Pilates weekly  The following were reviewed and entered/updated in epic: Past Medical History:  Diagnosis Date   Arthritis    Attention deficit disorder (ADD) in adult    Adderall $RemoveB'30mg'KvmknuGr$  tablet- half tablet twice a day.    Chicken pox    Colon polyp    dad died of colon cancer- q5 year colonoscopy. 2018   Encounter for blood transfusion    related to overtreatment with PCN. became very ill.    Heart murmur    Sees cardiology October each year due to murmur after fall- get records. Seen for heart murmur heard 2010. We discused may not need to see cardiology. Every other year stres test   Hyperglycemia    a1c 5.9 in 03/2017   Hyperlipidemia    atorvastatin $RemoveBefor'40mg'fDDjaHFHNuuY$    Hypertension    losartan 50, hctz 12.$RemoveBef'5mg'DEZBBtwPkp$    Hypothyroidism    levothyroxine 100 mcg 6 days a week as of mid 2019.    Seasonal allergies    prn antihistamine   Patient Active Problem List   Diagnosis Date Noted   OCD (obsessive compulsive disorder) 01/01/2018    Priority: High   Attention deficit disorder (ADD) in adult     Priority: High   Heart murmur     Priority: High   Arthritis 07/07/2017    Priority: Medium   Hypertension     Priority: Medium   Hyperlipidemia     Priority: Medium   Hypothyroidism     Priority: Medium   Hyperglycemia     Priority: Medium   Strain of right tibialis anterior muscle 10/30/2017    Priority: Low   Seasonal allergies     Priority: Low   Colon polyp     Priority: Low   Hyperthyroidism 03/16/2020   Past Surgical History:  Procedure Laterality Date   Arthroscopic surgery Left 1986-1996   Damaged cartilage Left knee   BACK SURGERY Bilateral 2008   Injury  due to a fall. 2 fractured vertebrae, 3 fractured ribs fracture L scapulla, mild concussion   CHOLECYSTECTOMY  2014   Relocation surgery Right    For right elbow ulna nerve    REPAIR of torn retina Right 2014   TONSILECTOMY, ADENOIDECTOMY, BILATERAL MYRINGOTOMY AND TUBES  1958   vena Laser   2009   Remaoval of varicous veins    Family History  Problem Relation Age of Onset   Arthritis Mother        passed at age 53   Miscarriages / [place] Mother    Hearing loss Father    Colon cancer Father        died at 72   Other Daughter        diabetes educator   Heart disease Paternal 26 / [place] Daughter    Diabetes Mellitus I Daughter        in [place]    Medications- reviewed and updated Current Outpatient Medications  Medication Sig Dispense Refill   atorvastatin (LIPITOR) 40 MG tablet TAKE ONE TABLET BY MOUTH DAILY 90 tablet 1   Black Pepper-Turmeric (TURMERIC CURCUMIN) 05-998 MG CAPS Take 1 capsule by mouth daily.     Calcium Citrate-Vitamin D (  CITRACAL + D PO) Take 25 mcg by mouth daily.     CHONDROITIN SULFATE PO Take 200 mg by mouth daily.     diclofenac Sodium (VOLTAREN) 1 % GEL APPLY 2 GRAMS TO AFFECTED AREA(S) EVERY NIGHT AT BEDTIME 100 g 2   escitalopram (LEXAPRO) 10 MG tablet TAKE ONE TABLET BY MOUTH DAILY 90 tablet 3   Glucosamine HCl 1500 MG TABS Take 1,500 mg by mouth daily.     Hyaluronic Acid-Vitamin C (HYALURONIC ACID PO) Take 3.3 mg by mouth daily.     hydrochlorothiazide (MICROZIDE) 12.5 MG capsule Take 1 capsule (12.5 mg total) by mouth daily. 90 capsule 3   latanoprost (XALATAN) 0.005 % ophthalmic solution Place 1 drop into both eyes at bedtime.     MAGNESIUM PO Take 72 mg by mouth.     metFORMIN (GLUCOPHAGE) 500 MG tablet Take 0.5 tablets (250 mg total) by mouth daily with breakfast. 45 tablet 3   methimazole (TAPAZOLE) 5 MG tablet Take 1.5 tablets (7.5 mg total) by mouth daily. 135 tablet 1   Omega-3 Fatty Acids (OMEGA-3  FISH OIL PO) Take by mouth.     OVER THE COUNTER MEDICATION Magtein     Probiotic Product (PROBIOTIC ADVANCED PO) Take 2 tablets by mouth daily.     valsartan (DIOVAN) 320 MG tablet Take 1 tablet (320 mg total) by mouth daily. 90 tablet 3   vitamin E 400 UNIT capsule Take 800 Units by mouth daily.     No current facility-administered medications for this visit.    Allergies-reviewed and updated No Known Allergies  Social History   Social History Narrative   Married. Twin daughters. 4 grandkids (2 each)   Moved from [place], [place]      Retired from Production manager: time with grandkids, enjoys travel   Objective  Objective:  BP 132/70   Pulse (!) 52   Temp 98.1 F (36.7 C) (Temporal)   Ht 5' 9"  (1.753 m)   Wt 174 lb 9.6 oz (79.2 kg)   SpO2 97%   BMI 25.78 kg/m  Gen: NAD, resting comfortably HEENT: Mucous membranes are moist. Oropharynx normal Neck: no thyromegaly CV: RRR with occasional ectopic beats (history of PACs). Stable faint murmur Lungs: CTAB no crackles, wheeze, rhonchi Abdomen: soft/nontender/nondistended/normal bowel sounds. No rebound or guarding.  Ext: no edema Skin: warm, dry Neuro: grossly normal, moves all extremities, PERRLA    Assessment and Plan  75 y.o. male presenting for annual physical.  Health Maintenance counseling: 1. Anticipatory guidance: Patient counseled regarding regular dental exams -q6 months, eye exams -yearly with [facility] and regular optho with [facility] to monitor pressure- does not have glaucoma (on for prevention) ,  avoiding smoking and second hand smoke , limiting alcohol to 2 beverages per day - doesn't remember last beverage.   2. Risk factor reduction:  Advised patient of need for regular exercise and diet rich and fruits and vegetables to reduce risk of heart attack and stroke. Exercise- yardwork and some walks with wife- and Y once a week and pilates once a week Diet-reasonably  healthy.  Wt Readings from Last 3 Encounters:  07/13/20 174 lb 9.6 oz (79.2 kg)  06/12/20 174 lb 4 oz (79 kg)  03/15/20 156 lb 2 oz (70.8 kg)  3. Immunizations/screenings/ancillary studies- Prevnar 20 recommended today Immunization History  Administered Date(s) Administered   Fluad Quad(high Dose 65+) 09/22/2018, 10/13/2019   Influenza, High Dose  Seasonal PF 10/10/2017   MMR 04/14/2013   Moderna SARS-COV2 Booster Vaccination 05/18/2020   PFIZER(Purple Top)SARS-COV-2 Vaccination 02/28/2019, 03/25/2019, 10/22/2019   Pneumococcal Conjugate-13 07/01/2013   Pneumococcal Polysaccharide-23 01/03/2012   Tdap 05/17/2013   Zoster Recombinat (Shingrix) 10/12/2017, 02/17/2018   Zoster, Live 08/02/2010   4. Prostate cancer screening- he would like to have this done at least once a year- it was discussed on 10/23/17 that guidelines stop at 70-he prefers to continue to check .PSA trend was low risk 10/23/17. Will opt out of rectal. Nocturia down to once a night with potassium Lab Results  Component Value Date   PSA 1.22 04/08/2017   5. Colon cancer screening - 2018 colonoscopy with 5 year repeat per prior MD due to father with colon cancer.He wanted to do this locally. Will refer next year. 6. Skin cancer screening- saw dermatology recently within last 6 months- [place] [facility]. advised regular sunscreen use. Denies worrisome, changing, or new skin lesions.  7.  Never smoker 8. STD screening - opts out-monogamous  Status of chronic or acute concerns   #hypertension S: medication: Valsartan 320 mg (changed from losartan 100 mg) hctz 12.5 mg Home readings #s: 120s/60s recently BP Readings from Last 3 Encounters:  07/13/20 132/70  06/12/20 136/66  03/15/20 140/70  A/P: Stable. Continue current medications.   #hyperthyroidism prior hypothyroidism S:medication: Methimazole 5 mg prescribed on 06/12/20- 1.5 tablets daily- was off thyroid medicine prior to this. Prior weight loss likely related  to this- weight has increased at this point Lab Results  Component Value Date   TSH 1.21 06/12/2020   A/P:doing well with follow up with endocrine- continue current medications   #hyperlipidemia- baseline LDL not known- all values we have are with statin S: Medication: Atorvastatin 40 mg-prior elevated LFTs-improved on last check Hepatic Function Latest Ref Rng & Units 03/08/2020 02/02/2020 12/28/2019  Total Protein 6.0 - 8.3 g/dL 6.5 6.8 6.5  Albumin 3.5 - 5.2 g/dL 3.9 4.0 -  AST 0 - 37 U/L 33 37 38(H)  ALT 0 - 53 U/L 51 59(H) 51(H)  Alk Phosphatase 39 - 117 U/L 71 70 -  Total Bilirubin 0.2 - 1.2 mg/dL 0.7 0.9 1.0    Lab Results  Component Value Date   CHOL 147 07/07/2019   HDL 49.90 07/07/2019   LDLCALC 86 07/07/2019   LDLDIRECT 89.0 08/18/2018   TRIG 55.0 07/07/2019   CHOLHDL 3 07/07/2019   A/P: hopefully Stable. Continue current medications for now- update lipids  # Hyperglycemia- A1c 5.29 March 2017- encouraged exercise on 10/23/17 visit S:medications: metformin 500 mg- 0.5 tablet daily with breakfast. Restarted April 5th Lab Results  Component Value Date   HGBA1C 5.7 (H) 12/28/2019  . A/P: hopefully stable- update a1c today. Continue current meds   # Heart murmur S: has seen cardiology each October. Echo from 10/2016 showed mild mitral regurgitation only. Only mild abnormalities on echo 01/02/2018 A/P: stable today- monitor only    # Adult ADD- Diagnosed by Dr. Blane Ohara S: patient was taking 15 mg BID of adderall (splits 84m tablet daily use). It was controlling ADD well but he did stop due to work- which seemed to help more as well. He did not note any significant worsening symptoms at that time. A/P: doing well- continue to monitor   Recommended follow up: No follow-ups on file. Future Appointments  Date Time [place] 08/09/2020 10:00 AM [facility] LAB [facility] None  08/30/2020  1:30 PM [facility] CCM PHARMACIST [facility]  08/31/2020  9:15 AM  Hayden Pedro, MD [facility] None  10/18/2020 10:10 AM [name], [name], MD [facility] None  03/09/2021  1:00 PM [facility] HEALTH COACH [facility]   Lab/Order associations:not fasting   ICD-10-CM   1. Preventative health care  Z00.00     2. Hyperthyroidism  E05.90     3. Primary hypertension  I10 CBC with Differential/Platelet    Comprehensive metabolic panel    Lipid panel    4. Hyperlipidemia, unspecified hyperlipidemia type  E78.5 CBC with Differential/Platelet    Comprehensive metabolic panel    Lipid panel    5. Hyperglycemia  R73.9 HgB A1c    6. Polyp of colon, unspecified part of colon, unspecified type  K63.5     7. Nocturia  R35.1 PSA      No orders of the defined types were placed in this encounter.  I,Harris Phan,acting as a Education administrator for [name], MD.,have documented all relevant documentation on the behalf of [name], MD,as directed by  [name], MD while in the presence of [name], MD.  I, [name], MD, have reviewed all documentation for this visit. The documentation on 07/13/20 for the exam, diagnosis, procedures, and orders are all accurate and complete.  Return precautions advised.  [name], MD

## 2020-07-13 NOTE — Patient Instructions (Addendum)
Prevnar 20 today.  Thanks for doing labs If you have mychart- we will send your results within 3 business days of [place] receiving them.  If you do not have mychart- we will call you about results within 5 business days of [place] receiving them.  *please also note that you will see labs on mychart as soon as they post. I will later go in and write notes on them- will say "notes from Dr. Yong Channel"   Recommended follow up: Return in about 6 months (around 01/12/2021) for a physical.

## 2020-07-13 NOTE — Chronic Care Management (AMB) (Signed)
    Chronic Care Management Pharmacy Assistant   Name: Darius Long.  MRN: [id number] DOB: 07-19-1945  Reason for Encounter: General Adherence Call  Recent office visits:  07/13/20- [name], MD- seen for annual physical, no medication changes, Prevnar 20 administered, follow up 6 months  03/08/20- [name], MD- chronic conditions addressed, no medication changes, follow up as scheduled  Recent consult visits:  06/12/20- [name], [name], MD (Endocrinology)- seen for hyperthyroidism, decreased methimazole from 5 mg bid daily to 7.5 mg daily, follow up 4 months 03/15/20- [name], [name], MD (Endocrinology)- seen for consultative assistance with hyperthyroidism, started methimazole from 5 mg bid daily   Hospital visits:  None in previous 6 months  Medications: Outpatient Encounter Medications as of 07/13/2020  Medication Sig Note   atorvastatin (LIPITOR) 40 MG tablet TAKE ONE TABLET BY MOUTH DAILY    Black Pepper-Turmeric (TURMERIC CURCUMIN) 05-998 MG CAPS Take 1 capsule by mouth daily.    Calcium Citrate-Vitamin D (CITRACAL + D PO) Take 25 mcg by mouth daily.    CHONDROITIN SULFATE PO Take 200 mg by mouth daily.    diclofenac Sodium (VOLTAREN) 1 % GEL APPLY 2 GRAMS TO AFFECTED AREA(S) EVERY NIGHT AT BEDTIME    escitalopram (LEXAPRO) 10 MG tablet TAKE ONE TABLET BY MOUTH DAILY    Glucosamine HCl 1500 MG TABS Take 1,500 mg by mouth daily.    Hyaluronic Acid-Vitamin C (HYALURONIC ACID PO) Take 3.3 mg by mouth daily.    hydrochlorothiazide (MICROZIDE) 12.5 MG capsule Take 1 capsule (12.5 mg total) by mouth daily.    latanoprost (XALATAN) 0.005 % ophthalmic solution Place 1 drop into both eyes at bedtime.    metFORMIN (GLUCOPHAGE) 500 MG tablet Take 0.5 tablets (250 mg total) by mouth daily with breakfast.    methimazole (TAPAZOLE) 5 MG tablet Take 1.5 tablets (7.5 mg total) by mouth daily.    Omega-3 Fatty Acids (OMEGA-3 FISH OIL PO) Take by  mouth. 03/03/2020: 690mg  epa, dha    OVER THE COUNTER MEDICATION Magtein    Probiotic Product (PROBIOTIC ADVANCED PO) Take 2 tablets by mouth daily.    valsartan (DIOVAN) 320 MG tablet Take 1 tablet (320 mg total) by mouth daily.    vitamin E 400 UNIT capsule Take 800 Units by mouth daily.    No facility-administered encounter medications on file as of 07/13/2020.   Have you had any problems recently with your health? Patient stated he has had no recent problems   Have you had any problems with your pharmacy? Patient stated he has not had any recent problems with his pharmacy   What issues or side effects are you having with your medications? Patient stated he has not had any recent side effects from any medications  What would you like me to pass along to [facility] for them to help you with?  Patient stated he had nothing to pass along   What can we do to take care of you better? Patient offered no suggestions   Star Rating Drugs: Atorvastatin 40 mg- 90 DS last filled 03/27/20 Valsartan 320 mg- 90 DS last filled 04/29/20  Wilford Sports CPA, CMA

## 2020-07-14 LAB — CBC WITH DIFFERENTIAL/PLATELET
Basophils Absolute: 0.1 10*3/uL (ref 0.0–0.1)
Basophils Relative: 1 % (ref 0.0–3.0)
Eosinophils Absolute: 0.2 10*3/uL (ref 0.0–0.7)
Eosinophils Relative: 3.1 % (ref 0.0–5.0)
HCT: 41.7 % (ref 39.0–52.0)
Hemoglobin: 14 g/dL (ref 13.0–17.0)
Lymphocytes Relative: 34.6 % (ref 12.0–46.0)
Lymphs Abs: 2.4 10*3/uL (ref 0.7–4.0)
MCHC: 33.5 g/dL (ref 30.0–36.0)
MCV: 90.5 fl (ref 78.0–100.0)
Monocytes Absolute: 0.7 10*3/uL (ref 0.1–1.0)
Monocytes Relative: 10.4 % (ref 3.0–12.0)
Neutro Abs: 3.5 10*3/uL (ref 1.4–7.7)
Neutrophils Relative %: 50.9 % (ref 43.0–77.0)
Platelets: 231 10*3/uL (ref 150.0–400.0)
RBC: 4.61 Mil/uL (ref 4.22–5.81)
RDW: 14.7 % (ref 11.5–15.5)
WBC: 7 10*3/uL (ref 4.0–10.5)

## 2020-07-14 LAB — COMPREHENSIVE METABOLIC PANEL
ALT: 45 U/L (ref 0–53)
AST: 43 U/L — ABNORMAL HIGH (ref 0–37)
Albumin: 4.3 g/dL (ref 3.5–5.2)
Alkaline Phosphatase: 76 U/L (ref 39–117)
BUN: 22 mg/dL (ref 6–23)
CO2: 33 mEq/L — ABNORMAL HIGH (ref 19–32)
Calcium: 9.2 mg/dL (ref 8.4–10.5)
Chloride: 100 mEq/L (ref 96–112)
Creatinine, Ser: 0.97 mg/dL (ref 0.40–1.50)
GFR: 76.17 mL/min (ref >60.00)
Glucose, Bld: 86 mg/dL (ref 70–99)
Potassium: 4.1 mEq/L (ref 3.5–5.1)
Sodium: 138 mEq/L (ref 135–145)
Total Bilirubin: 1 mg/dL (ref 0.2–1.2)
Total Protein: 6.6 g/dL (ref 6.0–8.3)

## 2020-07-14 LAB — LIPID PANEL
Cholesterol: 136 mg/dL (ref 0–200)
HDL: 44.3 mg/dL (ref >39.00)
LDL Cholesterol: 76 mg/dL (ref 0–99)
NonHDL: 91.47
Total CHOL/HDL Ratio: 3
Triglycerides: 79 mg/dL (ref 0.0–149.0)
VLDL: 15.8 mg/dL (ref 0.0–40.0)

## 2020-07-14 LAB — HEMOGLOBIN A1C: Hgb A1c MFr Bld: 6.2 % (ref 4.6–6.5)

## 2020-07-14 LAB — PSA: PSA: 0.94 ng/mL (ref 0.10–4.00)

## 2020-07-15 ENCOUNTER — Encounter: Payer: Self-pay | Admitting: Family Medicine

## 2020-07-17 ENCOUNTER — Other Ambulatory Visit: Payer: Self-pay

## 2020-07-17 MED ORDER — ATORVASTATIN CALCIUM 40 MG PO TABS: 40.0000 mg | ORAL_TABLET | Freq: Every day | ORAL | 3 refills | Status: DC

## 2020-07-19 ENCOUNTER — Telehealth: Payer: Self-pay

## 2020-07-19 DIAGNOSIS — M1712 Unilateral primary osteoarthritis, left knee: Secondary | ICD-10-CM | POA: Diagnosis not present

## 2020-07-19 DIAGNOSIS — M9906 Segmental and somatic dysfunction of lower extremity: Secondary | ICD-10-CM | POA: Diagnosis not present

## 2020-07-19 DIAGNOSIS — M25552 Pain in left hip: Secondary | ICD-10-CM | POA: Diagnosis not present

## 2020-07-19 DIAGNOSIS — G2581 Restless legs syndrome: Secondary | ICD-10-CM | POA: Diagnosis not present

## 2020-07-19 DIAGNOSIS — M9905 Segmental and somatic dysfunction of pelvic region: Secondary | ICD-10-CM | POA: Diagnosis not present

## 2020-07-19 DIAGNOSIS — M9904 Segmental and somatic dysfunction of sacral region: Secondary | ICD-10-CM | POA: Diagnosis not present

## 2020-07-19 DIAGNOSIS — M9903 Segmental and somatic dysfunction of lumbar region: Secondary | ICD-10-CM | POA: Diagnosis not present

## 2020-07-19 NOTE — Progress Notes (Signed)
    Chronic Care Management Pharmacy Assistant   Name: Darius Long.  MRN: [id number] DOB: Oct 10, 1945   Reason for Encounter: chart review   Recent office visits:  07/13/20-[name] MD (PCP)- Office visit for preventative care. Pneumonia vaccine given.  Follow up in 6 months.   Recent consult visits:  None  Hospital visits:  None  Medications: Outpatient Encounter Medications as of 07/19/2020  Medication Sig Note   atorvastatin (LIPITOR) 40 MG tablet Take 1 tablet (40 mg total) by mouth daily.    Black Pepper-Turmeric (TURMERIC CURCUMIN) 05-998 MG CAPS Take 1 capsule by mouth daily.    Calcium Citrate-Vitamin D (CITRACAL + D PO) Take 25 mcg by mouth daily.    CHONDROITIN SULFATE PO Take 200 mg by mouth daily.    diclofenac Sodium (VOLTAREN) 1 % GEL APPLY 2 GRAMS TO AFFECTED AREA(S) EVERY NIGHT AT BEDTIME    escitalopram (LEXAPRO) 10 MG tablet TAKE ONE TABLET BY MOUTH DAILY    Glucosamine HCl 1500 MG TABS Take 1,500 mg by mouth daily.    Hyaluronic Acid-Vitamin C (HYALURONIC ACID PO) Take 3.3 mg by mouth daily.    hydrochlorothiazide (MICROZIDE) 12.5 MG capsule Take 1 capsule (12.5 mg total) by mouth daily.    latanoprost (XALATAN) 0.005 % ophthalmic solution Place 1 drop into both eyes at bedtime.    MAGNESIUM PO Take 72 mg by mouth.    metFORMIN (GLUCOPHAGE) 500 MG tablet Take 0.5 tablets (250 mg total) by mouth daily with breakfast.    methimazole (TAPAZOLE) 5 MG tablet Take 1.5 tablets (7.5 mg total) by mouth daily.    Omega-3 Fatty Acids (OMEGA-3 FISH OIL PO) Take by mouth. 03/03/2020: 690mg  epa, dha    OVER THE COUNTER MEDICATION Magtein    Probiotic Product (PROBIOTIC ADVANCED PO) Take 2 tablets by mouth daily.    valsartan (DIOVAN) 320 MG tablet Take 1 tablet (320 mg total) by mouth daily.    vitamin E 400 UNIT capsule Take 800 Units by mouth daily.    No facility-administered encounter medications on file as of 07/19/2020.    Reviewed chart for medication  changes and adherence.  Recent OV, Consult or Hospital visit: see above No medication changes indicated  No gaps in adherence identified. Patient has follow up scheduled with pharmacy team. No further action required.   [place]

## 2020-07-26 DIAGNOSIS — H43813 Vitreous degeneration, bilateral: Secondary | ICD-10-CM | POA: Diagnosis not present

## 2020-07-26 DIAGNOSIS — H532 Diplopia: Secondary | ICD-10-CM | POA: Diagnosis not present

## 2020-07-26 DIAGNOSIS — E119 Type 2 diabetes mellitus without complications: Secondary | ICD-10-CM | POA: Diagnosis not present

## 2020-07-26 DIAGNOSIS — H40013 Open angle with borderline findings, low risk, bilateral: Secondary | ICD-10-CM | POA: Diagnosis not present

## 2020-07-26 DIAGNOSIS — H2513 Age-related nuclear cataract, bilateral: Secondary | ICD-10-CM | POA: Diagnosis not present

## 2020-08-09 ENCOUNTER — Other Ambulatory Visit: Payer: PPO

## 2020-08-15 ENCOUNTER — Telehealth: Payer: Self-pay | Admitting: Pharmacist

## 2020-08-15 NOTE — Chronic Care Management (AMB) (Signed)
Chronic Care Management Pharmacy Assistant   Name: Tristion Vint.  MRN: [id number] DOB: [birth year]   Reason for Encounter: Hypertension Disease State Call    Recent office visits:  None  Recent consult visits:  None  Hospital visits:  None in previous 6 months  Medications: Outpatient Encounter Medications as of 08/15/2020  Medication Sig Note   atorvastatin (LIPITOR) 40 MG tablet Take 1 tablet (40 mg total) by mouth daily.    Black Pepper-Turmeric (TURMERIC CURCUMIN) 05-998 MG CAPS Take 1 capsule by mouth daily.    Calcium Citrate-Vitamin D (CITRACAL + D PO) Take 25 mcg by mouth daily.    CHONDROITIN SULFATE PO Take 200 mg by mouth daily.    diclofenac Sodium (VOLTAREN) 1 % GEL APPLY 2 GRAMS TO AFFECTED AREA(S) EVERY NIGHT AT BEDTIME    escitalopram (LEXAPRO) 10 MG tablet TAKE ONE TABLET BY MOUTH DAILY    Glucosamine HCl 1500 MG TABS Take 1,500 mg by mouth daily.    Hyaluronic Acid-Vitamin C (HYALURONIC ACID PO) Take 3.3 mg by mouth daily.    hydrochlorothiazide (MICROZIDE) 12.5 MG capsule Take 1 capsule (12.5 mg total) by mouth daily.    latanoprost (XALATAN) 0.005 % ophthalmic solution Place 1 drop into both eyes at bedtime.    MAGNESIUM PO Take 72 mg by mouth.    metFORMIN (GLUCOPHAGE) 500 MG tablet Take 0.5 tablets (250 mg total) by mouth daily with breakfast.    methimazole (TAPAZOLE) 5 MG tablet Take 1.5 tablets (7.5 mg total) by mouth daily.    Omega-3 Fatty Acids (OMEGA-3 FISH OIL PO) Take by mouth. 03/03/2020: '690mg'$  epa, dha    OVER THE COUNTER MEDICATION Magtein    Probiotic Product (PROBIOTIC ADVANCED PO) Take 2 tablets by mouth daily.    valsartan (DIOVAN) 320 MG tablet Take 1 tablet (320 mg total) by mouth daily.    vitamin E 400 UNIT capsule Take 800 Units by mouth daily.    No facility-administered encounter medications on file as of 08/15/2020.   Reviewed chart prior to disease state call. Spoke with patient regarding BP  Recent Office Vitals: BP  Readings from Last 3 Encounters:  07/13/20 136/72  06/12/20 136/66  03/15/20 140/70   Pulse Readings from Last 3 Encounters:  07/13/20 (!) 52  06/12/20 62  03/15/20 60    Wt Readings from Last 3 Encounters:  07/13/20 174 lb 9.6 oz (79.2 kg)  06/12/20 174 lb 4 oz (79 kg)  03/15/20 156 lb 2 oz (70.8 kg)     Kidney Function Lab Results  Component Value Date/Time   CREATININE 0.97 07/13/2020 04:23 PM   CREATININE 0.70 03/08/2020 10:55 AM   CREATININE 0.65 (L) 12/28/2019 04:31 PM   GFR 76.17 07/13/2020 04:23 PM    BMP Latest Ref Rng & Units 07/13/2020 03/08/2020 02/02/2020  Glucose 70 - 99 mg/dL 86 102(H) 99  BUN 6 - 23 mg/dL 22 25(H) 23  Creatinine 0.40 - 1.50 mg/dL 0.97 0.70 0.64  BUN/Creat Ratio 6 - 22 (calc) - - -  Sodium 135 - 145 mEq/L 138 138 138  Potassium 3.5 - 5.1 mEq/L 4.1 4.2 4.2  Chloride 96 - 112 mEq/L 100 101 105  CO2 19 - 32 mEq/L 33(H) 29 29  Calcium 8.4 - 10.5 mg/dL 9.2 9.2 9.5    Current antihypertensive regimen:  Valsartan 320 mg once a day  How often are you checking your Blood Pressure?   Current home BP readings:   What recent interventions/DTPs  have been made by any provider to improve Blood Pressure control since last CPP Visit: Losartan Potassium 100 mg once a day changed to Valsartan 320 mg once a day.  Any recent hospitalizations or ED visits since last visit with CPP? No  What diet changes have been made to improve Blood Pressure Control?   What exercise is being done to improve your Blood Pressure Control?    Adherence Review: Is the patient currently on ACE/ARB medication? Yes Does the patient have >5 day gap between last estimated fill dates? No   Star Rating Drugs: Atorvastatin 40 mg last filled 07/17/2020 90 DS Losartan Potassium 100 mg last filled 03/25/2020 90 DS Valsartan 320 mg last filled 07/22/2020 90 DS Metformin 500 mg last filled 04/25/2020 90 DS  **Unsuccessful Attempt at reaching patient to complete this call. Chart  Review completed.  April D Calhoun, [place] Pharmacist Assistant [phone]

## 2020-08-16 ENCOUNTER — Other Ambulatory Visit (INDEPENDENT_AMBULATORY_CARE_PROVIDER_SITE_OTHER): Payer: PPO

## 2020-08-16 ENCOUNTER — Other Ambulatory Visit: Payer: Self-pay

## 2020-08-16 DIAGNOSIS — E0590 Thyrotoxicosis, unspecified without thyrotoxic crisis or storm: Secondary | ICD-10-CM | POA: Diagnosis not present

## 2020-08-16 LAB — T4, FREE: Free T4: 0.58 ng/dL — ABNORMAL LOW (ref 0.60–1.60)

## 2020-08-16 LAB — TSH: TSH: 0.47 u[IU]/mL (ref 0.35–5.50)

## 2020-08-16 MED ORDER — METHIMAZOLE 5 MG PO TABS: 5.0000 mg | ORAL_TABLET | Freq: Every day | ORAL | 1 refills | Status: DC

## 2020-08-29 ENCOUNTER — Telehealth: Payer: Self-pay | Admitting: Pharmacist

## 2020-08-29 NOTE — Chronic Care Management (AMB) (Addendum)
Chronic Care Management Pharmacy Assistant   Name: Darius Long.  MRN: [id number] DOB: 01/08/1946   Reason for Encounter: Hypertension Disease State Call    Recent office visits:  None  Recent consult visits:  None  Hospital visits:  None in previous 6 months  Medications: Outpatient Encounter Medications as of 08/29/2020  Medication Sig Note   atorvastatin (LIPITOR) 40 MG tablet Take 1 tablet (40 mg total) by mouth daily.    Black Pepper-Turmeric (TURMERIC CURCUMIN) 05-998 MG CAPS Take 1 capsule by mouth daily.    Calcium Citrate-Vitamin D (CITRACAL + D PO) Take 25 mcg by mouth daily.    CHONDROITIN SULFATE PO Take 200 mg by mouth daily.    diclofenac Sodium (VOLTAREN) 1 % GEL APPLY 2 GRAMS TO AFFECTED AREA(S) EVERY NIGHT AT BEDTIME    escitalopram (LEXAPRO) 10 MG tablet TAKE ONE TABLET BY MOUTH DAILY    Glucosamine HCl 1500 MG TABS Take 1,500 mg by mouth daily.    Hyaluronic Acid-Vitamin C (HYALURONIC ACID PO) Take 3.3 mg by mouth daily.    hydrochlorothiazide (MICROZIDE) 12.5 MG capsule Take 1 capsule (12.5 mg total) by mouth daily.    latanoprost (XALATAN) 0.005 % ophthalmic solution Place 1 drop into both eyes at bedtime.    MAGNESIUM PO Take 72 mg by mouth.    metFORMIN (GLUCOPHAGE) 500 MG tablet Take 0.5 tablets (250 mg total) by mouth daily with breakfast.    methimazole (TAPAZOLE) 5 MG tablet Take 1 tablet (5 mg total) by mouth daily.    Omega-3 Fatty Acids (OMEGA-3 FISH OIL PO) Take by mouth. 03/03/2020: '690mg'$  epa, dha    OVER THE COUNTER MEDICATION Magtein    Probiotic Product (PROBIOTIC ADVANCED PO) Take 2 tablets by mouth daily.    valsartan (DIOVAN) 320 MG tablet Take 1 tablet (320 mg total) by mouth daily.    vitamin E 400 UNIT capsule Take 800 Units by mouth daily.    No facility-administered encounter medications on file as of 08/29/2020.    Reviewed chart prior to disease state call. Spoke with patient regarding BP  Recent Office Vitals: BP  Readings from Last 3 Encounters:  07/13/20 136/72  06/12/20 136/66  03/15/20 140/70   Pulse Readings from Last 3 Encounters:  07/13/20 (!) 52  06/12/20 62  03/15/20 60    Wt Readings from Last 3 Encounters:  07/13/20 174 lb 9.6 oz (79.2 kg)  06/12/20 174 lb 4 oz (79 kg)  03/15/20 156 lb 2 oz (70.8 kg)     Kidney Function Lab Results  Component Value Date/Time   CREATININE 0.97 07/13/2020 04:23 PM   CREATININE 0.70 03/08/2020 10:55 AM   CREATININE 0.65 (L) 12/28/2019 04:31 PM   GFR 76.17 07/13/2020 04:23 PM    BMP Latest Ref Rng & Units 07/13/2020 03/08/2020 02/02/2020  Glucose 70 - 99 mg/dL 86 102(H) 99  BUN 6 - 23 mg/dL 22 25(H) 23  Creatinine 0.40 - 1.50 mg/dL 0.97 0.70 0.64  BUN/Creat Ratio 6 - 22 (calc) - - -  Sodium 135 - 145 mEq/L 138 138 138  Potassium 3.5 - 5.1 mEq/L 4.1 4.2 4.2  Chloride 96 - 112 mEq/L 100 101 105  CO2 19 - 32 mEq/L 33(H) 29 29  Calcium 8.4 - 10.5 mg/dL 9.2 9.2 9.5    Current antihypertensive regimen:  Valsartan 320 mg once a day  How often are you checking your Blood Pressure? 1-2x per week  Current home BP readings: 130's/70's  What recent interventions/DTPs have been made by any provider to improve Blood Pressure control since last CPP Visit: Losartan Potassium once a day changed to Valsartan 320 mg once a day.  Any recent hospitalizations or ED visits since last visit with CPP? No  What diet changes have been made to improve Blood Pressure Control?  Patient states he eats a good variety of foods that are healthy. What exercise is being done to improve your Blood Pressure Control?  Patient states he walks occasionally and does pilates once a week.  Adherence Review: Is the patient currently on ACE/ARB medication? Yes Does the patient have >5 day gap between last estimated fill dates? No  Patient rescheduled his follow up appointment for 09/19/2020 at 10:00 am.  Future Appointments  Date Time Provider [place]  09/19/2020  10:00 AM [facility] CCM PHARMACIST [facility]  09/26/2020  8:45 AM Hayden Pedro, MD [facility] None  09/27/2020  7:50 AM [name], [name], MD [facility] None  01/30/2021 10:40 AM [name], MD [facility]  03/09/2021  1:00 PM [facility] HEALTH COACH [facility]      Star Rating Drugs: Atorvastatin 40 mg last filled 07/17/2020 90 DS Losartan Potassium 100 mg last filled 03/25/2020 90 DS Valsartan 320 mg last filled 07/22/2020 90 DS Metformin 50 mg last filled 08/20/2020 90 DS  April D Calhoun, [place] Pharmacist Assistant [phone]   10 minutes spent in review, coordination, and documentation.  Reviewed by: Beverly Milch, PharmD Clinical Pharmacist [phone]

## 2020-08-30 ENCOUNTER — Telehealth: Payer: PPO

## 2020-08-31 ENCOUNTER — Encounter (INDEPENDENT_AMBULATORY_CARE_PROVIDER_SITE_OTHER): Payer: PPO | Admitting: Ophthalmology

## 2020-09-19 ENCOUNTER — Ambulatory Visit (INDEPENDENT_AMBULATORY_CARE_PROVIDER_SITE_OTHER): Payer: PPO | Admitting: Pharmacist

## 2020-09-19 DIAGNOSIS — I10 Essential (primary) hypertension: Secondary | ICD-10-CM

## 2020-09-19 DIAGNOSIS — E0590 Thyrotoxicosis, unspecified without thyrotoxic crisis or storm: Secondary | ICD-10-CM

## 2020-09-19 NOTE — Patient Instructions (Addendum)
Visit Information   Goals Addressed             This Visit's Progress    Track and Manage My Blood Pressure-Hypertension   On track    Timeframe:  Long-Range Goal Priority:  High Start Date:  02/28/2020            Expected End Date: 08/27/2020  Follow Up Date 08/27/2020    - check blood pressure daily - write blood pressure results in a log or diary    Why is this important?   You won't feel high blood pressure, but it can still hurt your blood vessels.  High blood pressure can cause heart or kidney problems. It can also cause a stroke.  Making lifestyle changes like losing a little weight or eating less salt will help.  Checking your blood pressure at home and at different times of the day can help to control blood pressure.  If the doctor prescribes medicine remember to take it the way the doctor ordered.  Call the office if you cannot afford the medicine or if there are questions about it.     Notes:        Patient Care Plan: CCM Pharmacy Care Plan     Problem Identified: hypertension, hyperlipidemia, hypothyroidism   Priority: High  Note:   Medication Assistance: None required.  Patient affirms current coverage meets needs. would like cost review through [facility].  Patient's preferred pharmacy is:  [name] [address] [place] [address] Phone: [phone] Fax: [phone]  [facility] [place] [address] [place] [address] Phone: [phone] Fax: [place] [phone] [facility] [place], [place] [name] [id number] Phone: [phone] Fax: [phone]  We discussed: Benefits of medication synchronization, packaging and delivery as well as enhanced pharmacist oversight with [facility]. Patient decided to: review cost of Rx and OTC medications next month.  Follow Up:   Patient agrees to Care Plan and Follow-up.  Plan: Telephone follow up appointment with care management team member scheduled for:  6 months and The care management team will reach out to the patient again over the next 60 days.  CPA: 1 month cost review of [name] for patient and patient's wife, [name]. Also let them know costs of OTC through [facility].  Current Barriers:  no specific barriers. will review cost through [facility]   Pharmacist Clinical Goal(s):  Over the next 180 days, patient will verbalize ability to afford treatment regimen contact provider office for questions/concerns as evidenced notation of same in electronic health record through collaboration with PharmD and provider.   Interventions: 1:1 collaboration with [name], MD regarding development and update of comprehensive plan of care as evidenced by provider attestation and co-signature Inter-disciplinary care team collaboration (see longitudinal plan of care) Comprehensive medication review performed; medication list updated in electronic medical record  Hypertension (BP goal <140/90) -controlled -Current treatment: Valsartan 320 mg (02/02/2020) Hydrochlorothiazide 12.5 mg -Medications previously tried: losartan 100 mg, amlodipine  -Current home readings: 135-139/60s -Current dietary habits: Cheerios, fruit such as strawberries blue berries black berries. Lunch - beef stew, salmon. Evening - fat free yogurt, ice cream, nuts. Drinks - 3 cups of coffee per day, several cups of water  -Current exercise habits: occasional walking, pilates session once a week -Denies hypotensive/hypertensive symptoms -Educated  on BP goals and benefits of medications for prevention of heart attack, stroke and kidney damage; Daily salt intake goal < 2300 mg; Exercise goal of 150 minutes per week; Importance of home blood pressure monitoring; -Counseled to monitor BP at home, document, and provide log at  future appointments -Counseled on diet and exercise extensively Recommended to continue current medication Counseled on medications used to treat hypertension  Hyperlipidemia: (LDL goal < 70) -uncontrolled -Current treatment: Atorvastatin 40 mg once daily -Medications previously tried: n/a  -Current dietary patterns: see hypertension -Current exercise habits: see hypertension -Educated on Cholesterol goals;  Importance of limiting foods high in cholesterol; Exercise goal of 150 minutes per week; -Counseled on diet and exercise extensively Recommended to continue current medication  Hypothyroidism (Goal: Normalization of TSH/free thyroid hormone) -uncontrolled -Current treatment  No medications at this time; levothyroxine on hold. Scheduled for f/u with Dr. Yong Channel.  -Medications previously tried: n/a  -Counseled on proper use of levothyroxine.   Patient Goals/Self-Care Activities Over the next 180 days, patient will:  - take medications as prescribed check blood pressure  , document, and provide at future appointments  Follow Up Plan: Telephone follow up appointment with care management team member scheduled for: The care management team will reach out to the patient again over the next 60 days.   Current Barriers:  no specific barriers. will review costs of Rx and OTC through [facility]  Patient Goals/Self-Care Activities Over the next 180 days, patient will:  - take medications as prescribed  Follow Up Plan: The care management team will reach out to the patient again over the next 60 days.  Pharmacist f/u telephone visit next 6 months    Long-Range Goal: Disease management   Start Date: 09/19/2020  Expected End Date: 03/20/2021  This Visit's Progress: On track  Priority: High  Note:   urrent Barriers:  no specific barriers. will review cost through [facility]   Pharmacist Clinical Goal(s):  Over the next 180 days, patient will verbalize ability to afford  treatment regimen contact provider office for questions/concerns as evidenced notation of same in electronic health record through collaboration with PharmD and provider.   Interventions: 1:1 collaboration with [name], MD regarding development and update of comprehensive plan of care as evidenced by provider attestation and co-signature Inter-disciplinary care team collaboration (see longitudinal plan of care) Comprehensive medication review performed; medication list updated in electronic medical record  Hypertension (BP goal <140/90) -controlled -Current treatment: Valsartan 320 mg (02/02/2020) Hydrochlorothiazide 12.5 mg -Medications previously tried: losartan 100 mg, amlodipine  -Current home readings: 135-139/60s -Current dietary habits: Cheerios, fruit such as strawberries blue berries black berries. Lunch - beef stew, salmon. Evening - fat free yogurt, ice cream, nuts. Drinks - 3 cups of coffee per day, several cups of water  -Current exercise habits: occasional walking, pilates session once a week -Denies hypotensive/hypertensive symptoms -Educated on BP goals and benefits of medications for prevention of heart attack, stroke and kidney damage; Daily salt intake goal < 2300 mg; Exercise goal of 150 minutes per week; Importance of home blood pressure monitoring; -Counseled to monitor BP at home, document, and provide log at future appointments -Counseled on diet and exercise extensively Recommended to continue current medication Counseled on medications used to treat hypertension  Update 09/19/20 BP remains controlled on current meds. He denies any dizziness/HA's Continue to monitor and exercise as current.  No changes to meds at this time  Hyperlipidemia: (LDL goal < 70) -uncontrolled -Current treatment: Atorvastatin 40  mg once daily -Medications previously tried: n/a  -Current dietary patterns: see hypertension -Current exercise habits: see  hypertension -Educated on Cholesterol goals;  Importance of limiting foods high in cholesterol; Exercise goal of 150 minutes per week; -Counseled on diet and exercise extensively Recommended to continue current medication  [place] (Goal: Normalization of TSH/free thyroid hormone) -Controlled -Current treatment  Methimazole '5mg'$  daily -Medications previously tried: n/a  -TSH now controlled on methimazole. -Counseled on proper use of levothyroxine.   Patient Goals/Self-Care Activities Over the next 180 days, patient will:  - take medications as prescribed check blood pressure  , document, and provide at future appointments  Follow Up Plan: Telephone follow up appointment with care management team member scheduled for: The care management team will reach out to the patient again over the next 60 days.        Patient verbalizes understanding of instructions provided today and agrees to view in [place].  Telephone follow up appointment with pharmacy team member scheduled for: 6 months  Edythe Clarity, [place]

## 2020-09-19 NOTE — Progress Notes (Signed)
Chronic Care Management Pharmacy Note  09/19/2020 Name:  Darius Long. MRN:  [id number] DOB:  Feb 19, 1945  Subjective: Darius Long. is an 75 y.o. year old male who is a primary patient of Hunter, Brayton Mars, MD.  The CCM team was consulted for assistance with disease management and care coordination needs.    Engaged with patient by telephone for initial visit in response to provider referral for pharmacy case management and/or care coordination services.   Consent to Services:  The patient was given the following information about Chronic Care Management services today, agreed to services, and gave verbal consent: 1. CCM service includes personalized support from designated clinical staff supervised by the primary care provider, including individualized plan of care and coordination with other care providers 2. 24/7 contact phone numbers for assistance for urgent and routine care needs. 3. Service will only be billed when office clinical staff spend 20 minutes or more in a month to coordinate care. 4. Only one practitioner may furnish and bill the service in a calendar month. 5.The patient may stop CCM services at any time (effective at the end of the month) by phone call to the office staff. 6. The patient will be responsible for cost sharing (co-pay) of up to 20% of the service fee (after annual deductible is met). Patient agreed to services and consent obtained.  Patient Care Team: [name], MD as PCP - General (Family Medicine) [facility] as Consulting Physician Croitoru, Dani Gobble, MD as Consulting Physician (Cardiology) Hayden Pedro, MD as Consulting Physician (Ophthalmology) Edythe Clarity, [facility] (Pharmacist)  Objective:  Lab Results  Component Value Date   CREATININE 0.97 07/13/2020   BUN 22 07/13/2020   GFR 76.17 07/13/2020   NA 138 07/13/2020   K 4.1 07/13/2020   CALCIUM 9.2 07/13/2020   CO2 33 (H) 07/13/2020    Lab  Results  Component Value Date/Time   HGBA1C 6.2 07/13/2020 04:23 PM   HGBA1C 5.7 (H) 12/28/2019 04:31 PM   HGBA1C 5.9 04/08/2017 12:00 AM   GFR 76.17 07/13/2020 04:23 PM   GFR 90.12 03/08/2020 10:55 AM    Last diabetic Eye exam: No results found for: HMDIABEYEEXA  Last diabetic Foot exam: No results found for: HMDIABFOOTEX   Lab Results  Component Value Date   CHOL 136 07/13/2020   HDL 44.30 07/13/2020   LDLCALC 76 07/13/2020   LDLDIRECT 89.0 08/18/2018   TRIG 79.0 07/13/2020   CHOLHDL 3 07/13/2020    Hepatic Function Latest Ref Rng & Units 07/13/2020 03/08/2020 02/02/2020  Total Protein 6.0 - 8.3 g/dL 6.6 6.5 6.8  Albumin 3.5 - 5.2 g/dL 4.3 3.9 4.0  AST 0 - 37 U/L 43(H) 33 37  ALT 0 - 53 U/L 45 51 59(H)  Alk Phosphatase 39 - 117 U/L 76 71 70  Total Bilirubin 0.2 - 1.2 mg/dL 1.0 0.7 0.9   Lab Results  Component Value Date/Time   TSH 0.47 08/16/2020 09:43 AM   TSH 1.21 06/12/2020 10:20 AM   FREET4 0.58 (L) 08/16/2020 09:43 AM   FREET4 0.43 (L) 06/12/2020 10:20 AM    CBC Latest Ref Rng & Units 07/13/2020 12/28/2019 01/29/2019  WBC 4.0 - 10.5 K/uL 7.0 5.9 4.6  Hemoglobin 13.0 - 17.0 g/dL 14.0 14.3 14.4  Hematocrit 39.0 - 52.0 % 41.7 43.1 43.3  Platelets 150.0 - 400.0 K/uL 231.0 268 238.0    No results found for: VD25OH  Clinical ASCVD: No  The 10-year ASCVD  risk score Mikey Bussing DC Jr., et al., 2013) is: 50.8%   Values used to calculate the score:     Age: 88 years     Sex: Male     Is Non-Hispanic African American: No     Diabetic: Yes     Tobacco smoker: No     Systolic Blood Pressure: 867 mmHg     Is BP treated: Yes     HDL Cholesterol: 44.3 mg/dL     Total Cholesterol: 136 mg/dL    Depression screen [facility] 2/9 07/13/2020 03/03/2020 02/02/2020  Decreased Interest 0 0 0  Down, Depressed, Hopeless 0 0 0  PHQ - 2 Score 0 0 0  Altered sleeping - - -  Tired, decreased energy - - -  Change in appetite - - -  Feeling bad or failure about yourself  - - -  Trouble  concentrating - - -  Moving slowly or fidgety/restless - - -  Suicidal thoughts - - -  PHQ-9 Score - - -  Difficult doing work/chores - - -   Social History   Tobacco Use  Smoking Status Never  Smokeless Tobacco Never   BP Readings from Last 3 Encounters:  07/13/20 136/72  06/12/20 136/66  03/15/20 140/70   Pulse Readings from Last 3 Encounters:  07/13/20 (!) 52  06/12/20 62  03/15/20 60   Wt Readings from Last 3 Encounters:  07/13/20 174 lb 9.6 oz (79.2 kg)  06/12/20 174 lb 4 oz (79 kg)  03/15/20 156 lb 2 oz (70.8 kg)   Assessment/Interventions: Review of patient past medical history, allergies, medications, health status, including review of consultants reports, laboratory and other test data, was performed as part of comprehensive evaluation and provision of chronic care management services.   SDOH:  (Social Determinants of Health) assessments and interventions performed:  CCM Care Plan  No Known Allergies  Medications Reviewed Today     Reviewed by Edythe Clarity, [facility] (Pharmacist) on 09/19/20 at [place] Status: <None>   Medication Order Taking? Sig Documenting Provider Last Dose Status Informant  atorvastatin (LIPITOR) 40 MG tablet [id number] Yes Take 1 tablet (40 mg total) by mouth daily. [name], MD Taking Active   Black Pepper-Turmeric (TURMERIC CURCUMIN) 05-998 MG CAPS [id number] Yes Take 1 capsule by mouth daily. [provider] Taking Active Self  Calcium Citrate-Vitamin D (CITRACAL + D PO) [id number] Yes Take 25 mcg by mouth daily. [provider] Taking Active   CHONDROITIN SULFATE PO [id number] Yes Take 200 mg by mouth daily. [provider] Taking Active   diclofenac Sodium (VOLTAREN) 1 % GEL [id number] Yes APPLY 2 GRAMS TO AFFECTED AREA(S) EVERY NIGHT AT BEDTIME [name], MD Taking Active   escitalopram (LEXAPRO) 10 MG tablet [id number] Yes TAKE ONE TABLET BY MOUTH DAILY [name], MD Taking Active    Glucosamine HCl 1500 MG TABS [id number] Yes Take 1,500 mg by mouth daily. [provider] Taking Active   Hyaluronic Acid-Vitamin C (HYALURONIC ACID PO) [id number] Yes Take 3.3 mg by mouth daily. [provider] Taking Active   hydrochlorothiazide (MICROZIDE) 12.5 MG capsule [id number] Yes Take 1 capsule (12.5 mg total) by mouth daily. [name], MD Taking Active   latanoprost (XALATAN) 0.005 % ophthalmic solution [id number] Yes Place 1 drop into both eyes at bedtime. [provider] Taking Active   MAGNESIUM PO [id number] Yes Take 72 mg by mouth. [provider] Taking Active   metFORMIN (GLUCOPHAGE) 500  MG tablet [id number] Yes Take 0.5 tablets (250 mg total) by mouth daily with breakfast. [name], MD Taking Active   methimazole (TAPAZOLE) 5 MG tablet [id number] Yes Take 1 tablet (5 mg total) by mouth daily. [name], [name], MD Taking Active   Omega-3 Fatty Acids (OMEGA-3 FISH OIL PO) [id number] Yes Take by mouth. [provider] Taking Active            Med Note Willette Brace   Fri Mar 03, 2020  1:05 PM) 61m eTeressa LowerTHE COUNTER MEDICATION [id number] Magtein [provider] Taking Active   Probiotic Product (PROBIOTIC ADVANCED PO) [id number] Take 2 tablets by mouth daily. [provider] Taking Active   valsartan (DIOVAN) 320 MG tablet [id number] Take 1 tablet (320 mg total) by mouth daily. HMarin Olp MD Taking Active   vitamin E 400 UNIT capsule [id number] Take 800 Units by mouth daily. [provider] Taking Active             Patient Active Problem List   Diagnosis Date Noted   Hyperthyroidism 03/16/2020   OCD (obsessive compulsive disorder) 01/01/2018   Strain of right tibialis anterior muscle 10/30/2017   Arthritis 07/07/2017   Hypertension    Hyperlipidemia    Hypothyroidism    Hyperglycemia    Attention deficit disorder (ADD) in adult    Heart  murmur    Seasonal allergies    Colon polyp     Immunization History  Administered Date(s) Administered   Fluad Quad(high Dose 65+) 09/22/2018, 10/13/2019   Influenza, High Dose Seasonal PF 10/10/2017   MMR 04/14/2013   Moderna SARS-COV2 Booster Vaccination 05/18/2020   PFIZER(Purple Top)SARS-COV-2 Vaccination 02/28/2019, 03/25/2019, 10/22/2019   PNEUMOCOCCAL CONJUGATE-20 07/13/2020   Pneumococcal Conjugate-13 07/01/2013   Pneumococcal Polysaccharide-23 01/03/2012   Tdap 05/17/2013   Zoster Recombinat (Shingrix) 10/12/2017, 02/17/2018   Zoster, Live 08/02/2010   Conditions to be addressed/monitored:  HTN, HLD and hypothyroidism  Care Plan : [place] Updates made by DEdythe Clarity RPH since 09/19/2020 12:00 AM     Problem: hypertension, hyperlipidemia, hypothyroidism   Priority: High  Note:   Medication Assistance: None required.  Patient affirms current coverage meets needs. would like cost [facility] through [facility].  [facility]  [facility] [address] [id number]: [phone]: [phone] [place] FGr[place] Lake[place]#[place] [address]Phone: [phone]: [phone]##[phone]-[facility] CenterS[facility] [name] [address]Phone: [phone]: [phone] We discussed: Benefits of medication synchronization, packaging and delivery as well as enhanced pharmacist oversight with [facility]. Patient decided to: review cost of Rx and OTC medications next month.  Follow Up:  Patient agrees to Care Plan and Follow-up.  Plan: Telephone follow up appointment with care management team member scheduled for:  6 months and The care management team will reach out to the patient again over the next 60 days.  CPA: 1  month [facility] review [facility] and patient's wife, GEdd Fabian Also let them know costs of OTC through [facility].  Current Barriers:  no specific barriers. will review cost through [facility]   Pharmacist Clinical Goal(s):  Over the next 180 days, patient will verbalize  ability to afford treatment regimen contact provider office for questions/concerns as evidenced notation of same in electronic health record through collaboration with PharmD and provider.   Interventions: 1:1 collaboration with [name], MD regarding development and update of comprehensive plan of care as evidenced by provider attestation and co-signature Inter-disciplinary care team collaboration (see longitudinal plan of care) Comprehensive medication review performed; medication list updated in electronic medical record  Hypertension (BP goal <140/90) -controlled -Current treatment: Valsartan 320 mg (02/02/2020) Hydrochlorothiazide 12.5 mg -Medications previously tried: losartan 100 mg, amlodipine  -Current home readings: 135-139/60s -Current dietary habits: Cheerios, fruit such as strawberries blue berries black berries. Lunch - beef stew, salmon. Evening - fat free yogurt, ice cream, nuts. Drinks - 3 cups of coffee per day, several cups of water  -Current exercise habits: occasional walking, pilates session once a week -Denies hypotensive/hypertensive symptoms -Educated on BP goals and benefits of medications for prevention of heart attack, stroke and kidney damage; Daily salt intake goal < 2300 mg; Exercise goal of 150 minutes per week; Importance of home blood pressure monitoring; -Counseled to monitor BP at home, document, and provide log at future appointments -Counseled on diet and exercise extensively Recommended to continue current medication Counseled on medications used to treat hypertension  Hyperlipidemia: (LDL goal < 70) -uncontrolled -Current  treatment: Atorvastatin 40 mg once daily -Medications previously tried: n/a  -Current dietary patterns: see hypertension -Current exercise habits: see hypertension -Educated on Cholesterol goals;  Importance of limiting foods high in cholesterol; Exercise goal of 150 minutes per week; -Counseled on diet and exercise extensively Recommended to continue current medication  Hypothyroidism (Goal: Normalization of TSH/free thyroid hormone) -uncontrolled -Current treatment  No medications at this time; levothyroxine on hold. Scheduled for f/u with Dr. Yong Channel.  -Medications previously tried: n/a  -Counseled on proper use of levothyroxine.   Patient Goals/Self-Care Activities Over the next 180 days, patient will:  - take medications as prescribed check blood pressure  , document, and provide at future appointments  Follow Up Plan: Telephone follow up appointment with care management team member scheduled for: The care management team will reach out to the patient again over the next 60 days.   Current Barriers:  no specific barriers. will review costs of Rx and OTC through [facility]  Patient Goals/Self-Care Activities Over the next 180 days, patient will:  - take medications as prescribed  Follow Up Plan: The care management team will reach out to the patient again over the next 60 days.  Pharmacist f/u telephone visit next 6 months    Long-Range Goal: Disease management   Start Date: 09/19/2020  Expected End Date: 03/20/2021  This Visit's Progress: On track  Priority: High  Note:   urrent Barriers:  no specific barriers. will review cost through [facility]   Pharmacist Clinical Goal(s):  Over the next 180 days, patient will verbalize ability to afford treatment regimen contact provider office for questions/concerns as evidenced notation of same in electronic health record through collaboration with PharmD and provider.   Interventions: 1:1 collaboration with [name], MD regarding development and update of comprehensive plan of care as evidenced by provider attestation and co-signature Inter-disciplinary care team collaboration (see longitudinal plan of care) Comprehensive medication review performed; medication list updated in electronic medical record  Hypertension (BP goal <140/90) -controlled -Current treatment: Valsartan 320 mg (02/02/2020) Hydrochlorothiazide 12.5 mg -Medications previously tried: losartan 100 mg, amlodipine  -Current home readings: 135-139/60s -Current dietary habits: Cheerios, fruit such as strawberries  blue berries black berries. Lunch - beef stew, salmon. Evening - fat free yogurt, ice cream, nuts. Drinks - 3 cups of coffee per day, several cups of water  -Current exercise habits: occasional walking, pilates session once a week -Denies hypotensive/hypertensive symptoms -Educated on BP goals and benefits of medications for prevention of heart attack, stroke and kidney damage; Daily salt intake goal < 2300 mg; Exercise goal of 150 minutes per week; Importance of home blood pressure monitoring; -Counseled to monitor BP at home, document, and provide log at future appointments -Counseled on diet and exercise extensively Recommended to continue current medication Counseled on medications used to treat hypertension  Update 09/19/20 BP remains controlled on current meds. He denies any dizziness/HA's Continue to monitor and exercise as current.  No changes to meds at this time  Hyperlipidemia: (LDL goal < 70) -uncontrolled -Current treatment: Atorvastatin 40 mg once daily -Medications previously tried: n/a  -Current dietary patterns: see hypertension -Current exercise habits: see hypertension -Educated on Cholesterol goals;  Importance of limiting foods high in cholesterol; Exercise goal of 150 minutes per week; -Counseled on diet and exercise extensively Recommended to continue current medication  [place]  (Goal: Normalization of TSH/free thyroid hormone) -Controlled -Current treatment  Methimazole 31m daily -Medications previously tried: n/a  -TSH now controlled on methimazole. -Counseled on proper use of levothyroxine.   Patient Goals/Self-Care Activities Over the next 180 days, patient will:  - take medications as prescribed check blood pressure  , document, and provide at future appointments  Follow Up Plan: Telephone follow up appointment with care management team member scheduled for: The care management team will reach out to the patient again over the next 60 days.        Future Appointments  Date Time Provider DMetzger 09/26/2020  8:45 AM MHayden Pedro MD [facility] None  09/27/2020  7:50 AM [name], IMelanie Crazier MD [facility] None  01/30/2021 10:40 AM HMarin Olp MD [facility] PEC  03/09/2021  1:00 PM [facility] HMcDowell PharmD Clinical Pharmacist [phone]

## 2020-09-26 ENCOUNTER — Encounter (INDEPENDENT_AMBULATORY_CARE_PROVIDER_SITE_OTHER): Payer: PPO | Admitting: Ophthalmology

## 2020-09-26 ENCOUNTER — Other Ambulatory Visit: Payer: Self-pay

## 2020-09-26 DIAGNOSIS — H43813 Vitreous degeneration, bilateral: Secondary | ICD-10-CM | POA: Diagnosis not present

## 2020-09-26 DIAGNOSIS — H35033 Hypertensive retinopathy, bilateral: Secondary | ICD-10-CM | POA: Diagnosis not present

## 2020-09-26 DIAGNOSIS — H33301 Unspecified retinal break, right eye: Secondary | ICD-10-CM

## 2020-09-26 DIAGNOSIS — I10 Essential (primary) hypertension: Secondary | ICD-10-CM

## 2020-09-27 ENCOUNTER — Ambulatory Visit (INDEPENDENT_AMBULATORY_CARE_PROVIDER_SITE_OTHER): Payer: PPO | Admitting: Internal Medicine

## 2020-09-27 ENCOUNTER — Encounter: Payer: Self-pay | Admitting: Internal Medicine

## 2020-09-27 VITALS — BP 140/78 | HR 67 | Ht 69.0 in | Wt 173.6 lb

## 2020-09-27 DIAGNOSIS — E0590 Thyrotoxicosis, unspecified without thyrotoxic crisis or storm: Secondary | ICD-10-CM | POA: Diagnosis not present

## 2020-09-27 DIAGNOSIS — E0500 Thyrotoxicosis with diffuse goiter without thyrotoxic crisis or storm: Secondary | ICD-10-CM | POA: Diagnosis not present

## 2020-09-27 LAB — TSH: TSH: 0.13 u[IU]/mL — ABNORMAL LOW (ref 0.35–5.50)

## 2020-09-27 LAB — T4, FREE: Free T4: 0.64 ng/dL (ref 0.60–1.60)

## 2020-09-27 NOTE — Progress Notes (Signed)
Name: Darius Long.  MRN/ DOB: [id number], April 16, 1945    Age/ Sex: 75 y.o., male     PCP: Darius Olp, MD   Reason for Endocrinology Evaluation: Hyperthyroidism     Initial Endocrinology Clinic Visit: 03/15/2020    PATIENT IDENTIFIER: Darius Long. is a 75 y.o., male with a past medical history of HTN, dyslipidemia and OCD. He has followed with [facility] since 03/15/2020 for consultative assistance with management of his Hyperthyroidism      HISTORICAL SUMMARY:  They moved from [facility], [place] in May 2019  He was diagnosed with hypothyroidism ~ 15 yrs ago and was on LT-4  Until 02/03/2019     Over the summer/fall  of 2021 he has noted weight loss as well as decrease muscle mass. His LT-4 replacement has been gradually reduced until fully discontinued  On 02/03/2019   No amiodarone use   Methimazole started 02/2020   No FH of thyroid disease     SUBJECTIVE:     Today (09/27/2020):  Darius Long is here for Hyperthyroidism.     Weight has been stable  Denies abdominal pain or nausea  Denies fever  Denies burning or itching of the eyes unless on eye drops  Denies palpitations  Sees opthalmology for glaucoma and hx of retinal detachment  No local neck symptoms     Methimazole 5 mg 1 tab daily   HISTORY:  Past Medical History:  Past Medical History:  Diagnosis Date   Arthritis    Attention deficit disorder (ADD) in adult    Adderall '30mg'$  tablet- half tablet twice a day.    Chicken pox    Colon polyp    dad died of colon cancer- q5 year colonoscopy. 2018   Encounter for blood transfusion    related to overtreatment with PCN. became very ill.    Heart murmur    Sees cardiology October each year due to murmur after fall- get records. Seen for heart murmur heard 2010. We discused may not need to see cardiology. Every other year stres test   Hyperglycemia    a1c 5.9 in 03/2017   Hyperlipidemia    atorvastatin '40mg'$     Hypertension    losartan 50, hctz 12.'5mg'$    Hypothyroidism    levothyroxine 100 mcg 6 days a week as of mid 2019.    Seasonal allergies    prn antihistamine   Past Surgical History:  Past Surgical History:  Procedure Laterality Date   Arthroscopic surgery Left 1986-1996   Damaged cartilage Left knee   BACK SURGERY Bilateral 2008   Injury due to a fall. 2 fractured vertebrae, 3 fractured ribs fracture L scapulla, mild concussion   CHOLECYSTECTOMY  2014   Relocation surgery Right    For right elbow ulna nerve    REPAIR of torn retina Right 2014   TONSILECTOMY, ADENOIDECTOMY, BILATERAL MYRINGOTOMY AND TUBES  1958   vena Laser   2009   Remaoval of varicous veins   Social History:  reports that he has never smoked. He has never used smokeless tobacco. He reports that he does not currently use alcohol. He reports that he does not use drugs. Family History:  Family History  Problem Relation Age of Onset   Arthritis Mother        passed at age 76   Miscarriages / [place] Mother    Hearing loss Father    Colon cancer Father  died at 75   Other Daughter        diabetes educator   Heart disease Paternal 29 / Stillbirths Daughter    Diabetes Mellitus I Daughter        in [place]: Allergies as of 09/27/2020   No Known Allergies      Medication List        Accurate as of September 27, 2020  8:10 AM. If you have any questions, ask your nurse or doctor.          atorvastatin 40 MG tablet Commonly known as: LIPITOR Take 1 tablet (40 mg total) by mouth daily.   CHONDROITIN SULFATE PO Take 200 mg by mouth daily.   CITRACAL + D PO Take 25 mcg by mouth daily.   diclofenac Sodium 1 % Gel Commonly known as: VOLTAREN APPLY 2 GRAMS TO AFFECTED AREA(S) EVERY NIGHT AT BEDTIME   escitalopram 10 MG tablet Commonly known as: LEXAPRO TAKE ONE TABLET BY MOUTH DAILY   Glucosamine HCl 1500 MG Tabs Take 1,500 mg by mouth  daily.   HYALURONIC ACID PO Take 3.3 mg by mouth daily.   hydrochlorothiazide 12.5 MG capsule Commonly known as: MICROZIDE Take 1 capsule (12.5 mg total) by mouth daily.   latanoprost 0.005 % ophthalmic solution Commonly known as: XALATAN Place 1 drop into both eyes at bedtime.   MAGNESIUM PO Take 72 mg by mouth.   metFORMIN 500 MG tablet Commonly known as: GLUCOPHAGE Take 0.5 tablets (250 mg total) by mouth daily with breakfast.   methimazole 5 MG tablet Commonly known as: TAPAZOLE Take 1 tablet (5 mg total) by mouth daily.   OMEGA-3 FISH OIL PO Take by mouth.   OVER THE COUNTER MEDICATION Magtein   PROBIOTIC ADVANCED PO Take 2 tablets by mouth daily.   Turmeric Curcumin 05-998 MG Caps Take 1 capsule by mouth daily.   valsartan 320 MG tablet Commonly known as: DIOVAN Take 1 tablet (320 mg total) by mouth daily.   vitamin E 180 MG (400 UNITS) capsule Take 800 Units by mouth daily.          OBJECTIVE:   PHYSICAL EXAM: VS: BP 140/78 (BP Location: Right Arm, Patient Position: Sitting, Cuff Size: Small)   Pulse 67   Ht '5\' 9"'$  (1.753 m)   Wt 173 lb 9.6 oz (78.7 kg)   SpO2 98%   BMI 25.64 kg/m    EXAM: General: Pt appears well and is in NAD  Eyes: External eye exam normal without stare, lid lag or exophthalmos.  EOM intact  Neck: General: Supple without adenopathy. Thyroid: Thyroid size normal.  No goiter or nodules appreciated.   Lungs: Clear with good BS bilat with no rales, rhonchi, or wheezes  Heart: Auscultation: RRR.  Abdomen: Normoactive bowel sounds, soft, nontender, without masses or organomegaly palpable  Extremities:  BL LE: No pretibial edema normal ROM and strength.  Mental Status: Judgment, insight: Intact Orientation: Oriented to time, place, and person Mood and affect: No depression, anxiety, or agitation     DATA REVIEWED: Results for Darius, Long (MRN [id number]) as of 09/27/2020 15:13  Ref. Range 09/27/2020 08:15  TSH  Latest Ref Range: 0.35 - 5.50 uIU/mL 0.13 (L)  T4,Free(Direct) Latest Ref Range: 0.60 - 1.60 ng/dL 0.64    Results for Darius Long (MRN [id number]) as of 09/27/2020 08:00  Ref. Range 07/13/2020 16:23  Sodium Latest Ref Range: 135 - 145  mEq/L 138  Potassium Latest Ref Range: 3.5 - 5.1 mEq/L 4.1  Chloride Latest Ref Range: 96 - 112 mEq/L 100  CO2 Latest Ref Range: 19 - 32 mEq/L 33 (H)  Glucose Latest Ref Range: 70 - 99 mg/dL 86  BUN Latest Ref Range: 6 - 23 mg/dL 22  Creatinine Latest Ref Range: 0.40 - 1.50 mg/dL 0.97  Calcium Latest Ref Range: 8.4 - 10.5 mg/dL 9.2  Alkaline Phosphatase Latest Ref Range: 39 - 117 U/L 76  Albumin Latest Ref Range: 3.5 - 5.2 g/dL 4.3  AST Latest Ref Range: 0 - 37 U/L 43 (H)  ALT Latest Ref Range: 0 - 53 U/L 45  Total Protein Latest Ref Range: 6.0 - 8.3 g/dL 6.6  Total Bilirubin Latest Ref Range: 0.2 - 1.2 mg/dL 1.0  GFR Latest Ref Range: >60.00 mL/min 76.17   Results for NICKALOUS, BARBA (MRN [id number]) as of 09/27/2020 08:00  Ref. Range 07/13/2020 16:23  WBC Latest Ref Range: 4.0 - 10.5 K/uL 7.0  RBC Latest Ref Range: 4.22 - 5.81 Mil/uL 4.61  Hemoglobin Latest Ref Range: 13.0 - 17.0 g/dL 14.0  HCT Latest Ref Range: 39.0 - 52.0 % 41.7  MCV Latest Ref Range: 78.0 - 100.0 fl 90.5  MCHC Latest Ref Range: 30.0 - 36.0 g/dL 33.5  RDW Latest Ref Range: 11.5 - 15.5 % 14.7  Platelets Latest Ref Range: 150.0 - 400.0 K/uL 231.0  Neutrophils Latest Ref Range: 43.0 - 77.0 % 50.9  Lymphocytes Latest Ref Range: 12.0 - 46.0 % 34.6  Monocytes Relative Latest Ref Range: 3.0 - 12.0 % 10.4  Eosinophil Latest Ref Range: 0.0 - 5.0 % 3.1  Basophil Latest Ref Range: 0.0 - 3.0 % 1.0  NEUT# Latest Ref Range: 1.4 - 7.7 K/uL 3.5  Lymphocyte # Latest Ref Range: 0.7 - 4.0 K/uL 2.4  Monocyte # Latest Ref Range: 0.1 - 1.0 K/uL 0.7  Eosinophils Absolute Latest Ref Range: 0.0 - 0.7 K/uL 0.2  Basophils Absolute Latest Ref Range: 0.0 - 0.1 K/uL 0.1      Results  for SHARON, BENTE (MRN [id number]) as of 06/12/2020 09:57  Ref. Range 03/15/2020 12:16  TRAB Latest Ref Range: <=2.00 IU/L 18.65 (H)   Thyroid ULtrasound 04/04/2020    Parenchymal Echotexture: Moderately heterogenous   Isthmus: 0.4 cm   Right lobe: 4.8 x 2.3 x 2.0 cm   Left lobe: 4.1 x 2.2 x 1.7 cm   _________________________________________________________   Estimated total number of nodules >/= 1 cm: 0   Number of spongiform nodules >/=  2 cm not described below (TR1): 0   Number of mixed cystic and solid nodules >/= 1.5 cm not described below (TR2): 0   _________________________________________________________   Nodule # 1:   Location: Left; Superior   Maximum size: 0.7 cm; Other 2 dimensions: 0.6 x 0.5 cm   Composition: solid/almost completely solid (2)   Echogenicity: isoechoic (1)   Shape: not taller-than-wide (0)   Margins: smooth (0)   Echogenic foci: none (0)   ACR TI-RADS total points: 3.   ACR TI-RADS risk category: TR3 (3 points).   ACR TI-RADS recommendations:   Given size (<1.4 cm) and appearance, this nodule does NOT meet TI-RADS criteria for biopsy or dedicated follow-up.   _________________________________________________________   IMPRESSION: 1. Moderately heterogeneous, normal-sized thyroid gland. 2. Single solid left upper thyroid nodule (labeled 1, 0.7 cm) which does not meet criteria (TI-RADS category 3) for dedicated ultrasound follow-up or tissue sampling.  The above is in keeping with the ACR TI-RADS recommendations - J Am Coll Radiol 2017;14:587-595.   Ruthann Cancer, MD   [facility]   [facility]     ASSESSMENT / PLAN / RECOMMENDATIONS:   Hyperthyroidism Secondary to Graves' Disease   -Patient is clinically euthyroid -No local neck symptoms -His free T4 has normalized but his TSH is low again.  I am not going to make any changes we will recheck in 8  weeks  Medications  Continue methimazole 5 mg, 1 tablet daily    2. Graves' Disease :  -No extrathyroidal manifestations of Graves' disease.  He is up-to-date on eye exams   Follow-up in 4 months Labs in 8 weeks   Signed electronically by: Mack Guise, MD  [facility]  [facility] [address] [place], [place] [address] Phone: [phone] FAX: [phone]      CC: Darius Long, [place] [address] Phone: [phone]  Fax: [phone]   Return to Endocrinology clinic as below: Future Appointments  Date Time Provider [place]  01/30/2021 10:40 AM Darius Olp, MD [facility]  03/09/2021  1:00 PM [facility] HEALTH COACH [facility]  03/26/2021  3:45 PM [facility] CCM PHARMACIST [facility]  09/26/2021  9:15 AM Hayden Pedro, MD [facility] None

## 2020-09-27 NOTE — Patient Instructions (Signed)
-   Please stop by the lab today and in 8 weeks

## 2020-10-18 ENCOUNTER — Ambulatory Visit: Payer: PPO | Admitting: Internal Medicine

## 2020-10-30 ENCOUNTER — Telehealth: Payer: Self-pay | Admitting: Pharmacist

## 2020-10-30 NOTE — Chronic Care Management (AMB) (Signed)
Chronic Care Management Pharmacy Assistant   Name: Darius Long.  MRN: [id number] DOB: Nov 25, 1945   Reason for Encounter: Hypertension Adherence Call    Recent office visits:  None  Recent consult visits:  09/27/2020 OV (endocrinology) [name], [name], MD; -His free T4 has normalized but his TSH is low again.  I am not going to make any changes we will recheck in 8 weeks  Hospital visits:  None since last CPP Visit  Medications: Outpatient Encounter Medications as of 10/30/2020  Medication Sig Note   atorvastatin (LIPITOR) 40 MG tablet Take 1 tablet (40 mg total) by mouth daily.    Black Pepper-Turmeric (TURMERIC CURCUMIN) 05-998 MG CAPS Take 1 capsule by mouth daily.    Calcium Citrate-Vitamin D (CITRACAL + D PO) Take 25 mcg by mouth daily.    CHONDROITIN SULFATE PO Take 200 mg by mouth daily.    diclofenac Sodium (VOLTAREN) 1 % GEL APPLY 2 GRAMS TO AFFECTED AREA(S) EVERY NIGHT AT BEDTIME    escitalopram (LEXAPRO) 10 MG tablet TAKE ONE TABLET BY MOUTH DAILY    Glucosamine HCl 1500 MG TABS Take 1,500 mg by mouth daily.    Hyaluronic Acid-Vitamin C (HYALURONIC ACID PO) Take 3.3 mg by mouth daily.    hydrochlorothiazide (MICROZIDE) 12.5 MG capsule Take 1 capsule (12.5 mg total) by mouth daily.    latanoprost (XALATAN) 0.005 % ophthalmic solution Place 1 drop into both eyes at bedtime.    MAGNESIUM PO Take 72 mg by mouth.    metFORMIN (GLUCOPHAGE) 500 MG tablet Take 0.5 tablets (250 mg total) by mouth daily with breakfast.    methimazole (TAPAZOLE) 5 MG tablet Take 1 tablet (5 mg total) by mouth daily.    Omega-3 Fatty Acids (OMEGA-3 FISH OIL PO) Take by mouth. 03/03/2020: 690mg  epa, dha    OVER THE COUNTER MEDICATION Magtein    Probiotic Product (PROBIOTIC ADVANCED PO) Take 2 tablets by mouth daily.    valsartan (DIOVAN) 320 MG tablet Take 1 tablet (320 mg total) by mouth daily.    vitamin E 400 UNIT capsule Take 800 Units by mouth daily.    No  facility-administered encounter medications on file as of 10/30/2020.   Reviewed chart prior to disease state call. Spoke with patient regarding BP  Recent Office Vitals: BP Readings from Last 3 Encounters:  09/27/20 140/78  07/13/20 136/72  06/12/20 136/66   Pulse Readings from Last 3 Encounters:  09/27/20 67  07/13/20 (!) 52  06/12/20 62    Wt Readings from Last 3 Encounters:  09/27/20 173 lb 9.6 oz (78.7 kg)  07/13/20 174 lb 9.6 oz (79.2 kg)  06/12/20 174 lb 4 oz (79 kg)     Kidney Function Lab Results  Component Value Date/Time   CREATININE 0.97 07/13/2020 04:23 PM   CREATININE 0.70 03/08/2020 10:55 AM   CREATININE 0.65 (L) 12/28/2019 04:31 PM   GFR 76.17 07/13/2020 04:23 PM    BMP Latest Ref Rng & Units 07/13/2020 03/08/2020 02/02/2020  Glucose 70 - 99 mg/dL 86 102(H) 99  BUN 6 - 23 mg/dL 22 25(H) 23  Creatinine 0.40 - 1.50 mg/dL 0.97 0.70 0.64  BUN/Creat Ratio 6 - 22 (calc) - - -  Sodium 135 - 145 mEq/L 138 138 138  Potassium 3.5 - 5.1 mEq/L 4.1 4.2 4.2  Chloride 96 - 112 mEq/L 100 101 105  CO2 19 - 32 mEq/L 33(H) 29 29  Calcium 8.4 - 10.5 mg/dL 9.2 9.2 9.5  Current antihypertensive regimen:  Valsartan 320 mg daily HCTZ 12.5 mg daily  How often are you checking your Blood Pressure? 1-2x per week  Current home BP readings: 118-126/60's  What recent interventions/DTPs have been made by any provider to improve Blood Pressure control since last CPP Visit: No recent interventions or DTPs.  Any recent hospitalizations or ED visits since last visit with CPP? No  What diet changes have been made to improve Blood Pressure Control?  Patient states he eats a healthy diet. What exercise is being done to improve your Blood Pressure Control?  Patient states he like to walk and goes to pilates session once a week.  Adherence Review: Is the patient currently on ACE/ARB medication? Yes Does the patient have >5 day gap between last estimated fill dates? No   Care  Gaps: Medicare Annual Wellness: Completed Hemoglobin A1C: 6.2% on 07/13/2020 Colonoscopy: Next due on 11/12/2021  Future Appointments  Date Time Provider [place]  11/22/2020  8:00 AM [facility] LAB [facility] None  01/30/2021 10:40 AM [name], MD [facility]  01/31/2021  8:30 AM [name], [name], MD [facility] None  03/09/2021  1:00 PM [facility] HEALTH COACH [facility]  03/26/2021  3:45 PM [facility] CCM PHARMACIST [facility]  09/26/2021  9:15 AM Hayden Pedro, MD [facility] None    Star Rating Drugs: Atorvastatin 40 mg last filled 09/23/2020 90 DS Losartan Potassium 100 mg last filled 03/25/2020 90 DS Valsartan 320 mg last filled 10/26/2020 90 DS Metformin 500 mg last filled 08/20/2020 90 DS  April D Calhoun, [place] Pharmacist Assistant [phone]

## 2020-11-01 DIAGNOSIS — D1801 Hemangioma of skin and subcutaneous tissue: Secondary | ICD-10-CM | POA: Diagnosis not present

## 2020-11-01 DIAGNOSIS — L812 Freckles: Secondary | ICD-10-CM | POA: Diagnosis not present

## 2020-11-01 DIAGNOSIS — L821 Other seborrheic keratosis: Secondary | ICD-10-CM | POA: Diagnosis not present

## 2020-11-09 ENCOUNTER — Encounter: Payer: Self-pay | Admitting: Family Medicine

## 2020-11-11 ENCOUNTER — Other Ambulatory Visit: Payer: Self-pay | Admitting: Family Medicine

## 2020-11-15 ENCOUNTER — Encounter: Payer: Self-pay | Admitting: Family Medicine

## 2020-11-22 ENCOUNTER — Other Ambulatory Visit: Payer: Self-pay

## 2020-11-22 ENCOUNTER — Other Ambulatory Visit (INDEPENDENT_AMBULATORY_CARE_PROVIDER_SITE_OTHER): Payer: PPO

## 2020-11-22 ENCOUNTER — Encounter: Payer: Self-pay | Admitting: Internal Medicine

## 2020-11-22 DIAGNOSIS — E0590 Thyrotoxicosis, unspecified without thyrotoxic crisis or storm: Secondary | ICD-10-CM

## 2020-11-22 LAB — T4, FREE: Free T4: 0.69 ng/dL (ref 0.60–1.60)

## 2020-11-22 LAB — TSH: TSH: 0.14 u[IU]/mL — ABNORMAL LOW (ref 0.35–5.50)

## 2020-12-15 ENCOUNTER — Encounter: Payer: Self-pay | Admitting: Family Medicine

## 2020-12-18 ENCOUNTER — Ambulatory Visit (INDEPENDENT_AMBULATORY_CARE_PROVIDER_SITE_OTHER): Payer: PPO | Admitting: Family

## 2020-12-18 ENCOUNTER — Encounter: Payer: Self-pay | Admitting: Family

## 2020-12-18 ENCOUNTER — Other Ambulatory Visit: Payer: Self-pay

## 2020-12-18 VITALS — BP 130/72 | HR 58 | Temp 97.2°F | Ht 69.0 in | Wt 174.4 lb

## 2020-12-18 DIAGNOSIS — J019 Acute sinusitis, unspecified: Secondary | ICD-10-CM

## 2020-12-18 DIAGNOSIS — B9789 Other viral agents as the cause of diseases classified elsewhere: Secondary | ICD-10-CM | POA: Diagnosis not present

## 2020-12-18 MED ORDER — FLUTICASONE PROPIONATE 50 MCG/ACT NA SUSP
1.0000 | Freq: Every day | NASAL | 1 refills | Status: DC
Start: 2020-12-18 — End: 2022-03-18

## 2020-12-18 NOTE — Telephone Encounter (Signed)
Pt was seen in the office 12/18/2020.

## 2020-12-18 NOTE — Progress Notes (Signed)
Subjective:     Patient ID: Darius Mems., male    DOB: 06-Mar-1945, 75 y.o.   MRN: [id number]  Chief Complaint  Patient presents with   Cough    His symptoms started the early part of last week. He has taken OTC Aleve, Zycam, cough medicine, and also used a netty pot.  He denies fever and sore throat. He has test 3 times for Covid, all were negative.   Nasal Congestion   Fatigue   Night Sweats    HPI Upper Respiratory Infection: Symptoms include congestion, nasal congestion, non productive cough, post nasal drip, sneezing, and night sweats .  Onset of symptoms was 6 days ago, gradually improving since that time. He is drinking moderate amounts of fluids. Evaluation to date: none. Home Covid tests have been negative. Treatment to date: cough suppressants and Zycam, nasal saline lavage.   There are no preventive care reminders to display for this patient.  Past Medical History:  Diagnosis Date   Arthritis    Attention deficit disorder (ADD) in adult    Adderall 30mg  tablet- half tablet twice a day.    Chicken pox    Colon polyp    dad died of colon cancer- q5 year colonoscopy. 2018   Encounter for blood transfusion    related to overtreatment with PCN. became very ill.    Heart murmur    Sees cardiology October each year due to murmur after fall- get records. Seen for heart murmur heard 2010. We discused may not need to see cardiology. Every other year stres test   Hyperglycemia    a1c 5.9 in 03/2017   Hyperlipidemia    atorvastatin 40mg    Hypertension    losartan 50, hctz 12.5mg    Hypothyroidism    levothyroxine 100 mcg 6 days a week as of mid 2019.    Seasonal allergies    prn antihistamine    Past Surgical History:  Procedure Laterality Date   Arthroscopic surgery Left 1986-1996   Damaged cartilage Left knee   BACK SURGERY Bilateral 2008   Injury due to a fall. 2 fractured vertebrae, 3 fractured ribs fracture L scapulla, mild concussion   CHOLECYSTECTOMY   2014   Relocation surgery Right    For right elbow ulna nerve    REPAIR of torn retina Right 2014   TONSILECTOMY, ADENOIDECTOMY, BILATERAL MYRINGOTOMY AND TUBES  1958   vena Laser   2009   Remaoval of varicous veins    Outpatient Medications Prior to Visit  Medication Sig Dispense Refill   atorvastatin (LIPITOR) 40 MG tablet Take 1 tablet (40 mg total) by mouth daily. 90 tablet 3   Black Pepper-Turmeric (TURMERIC CURCUMIN) 05-998 MG CAPS Take 1 capsule by mouth daily.     Calcium Citrate-Vitamin D (CITRACAL + D PO) Take 25 mcg by mouth daily.     CHONDROITIN SULFATE PO Take 200 mg by mouth daily.     diclofenac Sodium (VOLTAREN) 1 % GEL APPLY 2 GRAMS TO AFFECTED AREAS EVERY NIGHT AT BEDTIME 100 g 2   escitalopram (LEXAPRO) 10 MG tablet TAKE ONE TABLET BY MOUTH DAILY 90 tablet 3   Glucosamine HCl 1500 MG TABS Take 1,500 mg by mouth daily.     Hyaluronic Acid-Vitamin C (HYALURONIC ACID PO) Take 3.3 mg by mouth daily.     hydrochlorothiazide (MICROZIDE) 12.5 MG capsule Take 1 capsule (12.5 mg total) by mouth daily. 90 capsule 3   latanoprost (XALATAN) 0.005 % ophthalmic solution Place 1  drop into both eyes at bedtime.     MAGNESIUM PO Take 72 mg by mouth.     metFORMIN (GLUCOPHAGE) 500 MG tablet Take 0.5 tablets (250 mg total) by mouth daily with breakfast. 45 tablet 3   methimazole (TAPAZOLE) 5 MG tablet Take 1 tablet (5 mg total) by mouth daily. 90 tablet 1   Omega-3 Fatty Acids (OMEGA-3 FISH OIL PO) Take by mouth.     OVER THE COUNTER MEDICATION Magtein     Probiotic Product (PROBIOTIC ADVANCED PO) Take 2 tablets by mouth daily.     valsartan (DIOVAN) 320 MG tablet Take 1 tablet (320 mg total) by mouth daily. 90 tablet 3   vitamin E 400 UNIT capsule Take 800 Units by mouth daily.     No facility-administered medications prior to visit.    No Known Allergies      Objective:    Physical Exam Vitals and nursing note reviewed.  Constitutional:      General: He is not in acute  distress.    Appearance: Normal appearance.  HENT:     Head: Normocephalic.     Right Ear: Tympanic membrane and ear canal normal.     Left Ear: Tympanic membrane and ear canal normal.     Nose:     Right Sinus: Frontal sinus tenderness present.     Left Sinus: Frontal sinus tenderness present.     Mouth/Throat:     Mouth: Mucous membranes are moist.     Pharynx: Oropharyngeal exudate and posterior oropharyngeal erythema present. No pharyngeal swelling.  Cardiovascular:     Rate and Rhythm: Normal rate and regular rhythm.  Pulmonary:     Effort: Pulmonary effort is normal.     Breath sounds: Normal breath sounds.  Musculoskeletal:        General: Normal range of motion.     Cervical back: Normal range of motion.  Skin:    General: Skin is warm and dry.  Neurological:     Mental Status: He is alert and oriented to person, place, and time.  Psychiatric:        Mood and Affect: Mood normal.    BP 130/72   Pulse (!) 58   Temp (!) 97.2 F (36.2 C) (Temporal)   Ht 5\' 9"  (1.753 m)   Wt 174 lb 6.4 oz (79.1 kg)   SpO2 96%   BMI 25.75 kg/m  Wt Readings from Last 3 Encounters:  12/18/20 174 lb 6.4 oz (79.1 kg)  09/27/20 173 lb 9.6 oz (78.7 kg)  07/13/20 174 lb 9.6 oz (79.2 kg)       Assessment & Plan:   Problem List Items Addressed This Visit       Respiratory   Acute viral sinusitis - Primary    Pt reports feeling overall a little better, but still having a lot of sinus drainage, post nasal drip aggravating his cough. Sending Flonase nasal spray to start, pt advised on use & SE. Wife also present uses Flonase. Ok to also take generic Claritin or zyrtec qd.       Relevant Medications   fluticasone (FLONASE) 50 MCG/ACT nasal spray    Meds ordered this encounter  Medications   fluticasone (FLONASE) 50 MCG/ACT nasal spray    Sig: Place 1 spray into both nostrils daily.    Dispense:  16 g    Refill:  1    Order Specific Question:   Supervising Provider    Answer:    Jonni Sanger,  CAMILLE L [[id number]]

## 2020-12-18 NOTE — Patient Instructions (Signed)
It was very nice to see you today!  As discussed, I have sent generic Flonase nasal spray to your pharmacy, start this today. OK to continue generic Claritin or Zyrtec daily and Tylenol as needed for pain or fever.  Drink plenty of fluids! Call back to office at the end of the week,  if symptoms are worsening or not improving.   PLEASE NOTE:  If you had any lab tests please let us know if you have not heard back within a few days. You may see your results on MyChart before we have a chance to review them but we will give you a call once they are reviewed by [place]. If we ordered any referrals today, please let us know if you have not heard from their office within the next week.   Please try these tips to maintain a healthy lifestyle:  Eat most of your calories during the day when you are active. Eliminate processed foods including packaged sweets (pies, cakes, cookies), reduce intake of potatoes, white bread, white pasta, and white rice. Look for whole grain options, oat flour or almond flour.  Each meal should contain half fruits/vegetables, one quarter protein, and one quarter carbs (no bigger than a computer mouse).  Cut down on sweet beverages. This includes juice, soda, and sweet tea. Also watch fruit intake, though this is a healthier sweet option, it still contains natural sugar! Limit to 3 servings daily.  Drink at least 1 glass of water with each meal and aim for at least 8 glasses per day  Exercise at least 150 minutes every week.

## 2020-12-18 NOTE — Assessment & Plan Note (Signed)
Pt reports feeling overall a little better, but still having a lot of sinus drainage, post nasal drip aggravating his cough. Sending Flonase nasal spray to start, pt advised on use & SE. Wife also present uses Flonase. Ok to also take generic Claritin or zyrtec qd.

## 2021-01-10 ENCOUNTER — Encounter: Payer: Self-pay | Admitting: Family Medicine

## 2021-01-11 MED ORDER — VALSARTAN 320 MG PO TABS: 320.0000 mg | ORAL_TABLET | Freq: Every day | ORAL | 1 refills | Status: DC

## 2021-01-24 NOTE — Progress Notes (Signed)
Phone [phone] In person visit   Subjective:   Darius Mcgue. is a 76 y.o. year old very pleasant male patient who presents for/with See problem oriented charting Chief Complaint  Patient presents with   Follow-up   Hypertension   Hypothyroidism   Hyperlipidemia   Hyperglycemia    This visit occurred during the SARS-CoV-2 public health emergency.  Safety protocols were in place, including screening questions prior to the visit, additional usage of staff PPE, and extensive cleaning of exam room while observing appropriate contact time as indicated for disinfecting solutions.   Past Medical History-  Patient Active Problem List   Diagnosis Date Noted   Hyperthyroidism 03/16/2020    Priority: High   OCD (obsessive compulsive disorder) 01/01/2018    Priority: High   Attention deficit disorder (ADD) in adult     Priority: High   Heart murmur     Priority: High   Arthritis 07/07/2017    Priority: Medium    Hypertension     Priority: Medium    Hyperlipidemia     Priority: Medium    Hypothyroidism     Priority: Medium    Hyperglycemia     Priority: Medium    Strain of right tibialis anterior muscle 10/30/2017    Priority: Low   Seasonal allergies     Priority: Low   Colon polyp     Priority: Low   Acute viral sinusitis 12/18/2020    Medications- reviewed and updated Current Outpatient Medications  Medication Sig Dispense Refill   atorvastatin (LIPITOR) 40 MG tablet Take 1 tablet (40 mg total) by mouth daily. 90 tablet 3   Black Pepper-Turmeric (TURMERIC CURCUMIN) 05-998 MG CAPS Take 1 capsule by mouth daily.     Calcium Citrate-Vitamin D (CITRACAL + D PO) Take 25 mcg by mouth daily.     CHONDROITIN SULFATE PO Take 200 mg by mouth daily.     diclofenac Sodium (VOLTAREN) 1 % GEL APPLY 2 GRAMS TO AFFECTED AREAS EVERY NIGHT AT BEDTIME 100 g 2   escitalopram (LEXAPRO) 10 MG tablet TAKE ONE TABLET BY MOUTH DAILY 90 tablet 3   fluticasone (FLONASE) 50 MCG/ACT  nasal spray Place 1 spray into both nostrils daily. 16 g 1   Glucosamine HCl 1500 MG TABS Take 1,500 mg by mouth daily.     Hyaluronic Acid-Vitamin C (HYALURONIC ACID PO) Take 3.3 mg by mouth daily.     hydrochlorothiazide (MICROZIDE) 12.5 MG capsule Take 1 capsule (12.5 mg total) by mouth daily. 90 capsule 3   latanoprost (XALATAN) 0.005 % ophthalmic solution Place 1 drop into both eyes at bedtime.     MAGNESIUM PO Take 72 mg by mouth.     metFORMIN (GLUCOPHAGE) 500 MG tablet Take 0.5 tablets (250 mg total) by mouth daily with breakfast. 45 tablet 3   methimazole (TAPAZOLE) 5 MG tablet Take 1 tablet (5 mg total) by mouth daily. 90 tablet 1   Omega-3 Fatty Acids (OMEGA-3 FISH OIL PO) Take by mouth.     OVER THE COUNTER MEDICATION Magtein     Probiotic Product (PROBIOTIC ADVANCED PO) Take 2 tablets by mouth daily.     valsartan (DIOVAN) 320 MG tablet Take 1 tablet (320 mg total) by mouth daily. 90 tablet 1   vitamin E 400 UNIT capsule Take 800 Units by mouth daily.     No current facility-administered medications for this visit.     Objective:  BP 120/60    Pulse 66  Temp 98 F (36.7 C)    Ht 5\' 9"  (1.753 m)    Wt 169 lb 12.8 oz (77 kg)    SpO2 97%    BMI 25.08 kg/m  Gen: NAD, resting comfortably CV: RRR stable slight murmur Lungs: CTAB no crackles, wheeze, rhonchi Ext: no edema Skin: warm, dry     Assessment and Plan   #hypertension S: medication: Valsartan 320 mg (changed from losartan 100 mg) hctz 12.5 mg- may miss dose if travels BP Readings from Last 3 Encounters:  02/05/21 120/60  12/18/20 130/72  09/27/20 140/78  A/P: Controlled. Continue current medications.   #hyperthyroidism prior hypothyroidism-follows with endocrinology S:medication: Methimazole 5 mg daily prescribed on 06/12/20- 1.5 tablets twice a week and once a day on the other 5 days I could never forget about you my friend I read your chart right now- was off thyroid medicine prior to this. Prior weight loss  likely related to this- weight had increased at this point Lab Results  Component Value Date   TSH 0.14 (L) 11/22/2020   A/P:hopefully stable- in anticipation of  visit in 4 days we will order tsh, free t4 today. For now continue current meds   #hyperlipidemia- baseline LDL not known- all values we have are with statin S: Medication: Atorvastatin 40 mg daily-prior elevated LFTs-only AST mildly elevated. Weight down 5 lbs  in last few months- some could be thyroid plus was traveling and eats less with travel -doing ymca/pilates once a week Lab Results  Component Value Date   CHOL 136 07/13/2020   HDL 44.30 07/13/2020   LDLCALC 76 07/13/2020   LDLDIRECT 89.0 08/18/2018   TRIG 79.0 07/13/2020   CHOLHDL 3 07/13/2020   A/P: lipids just mildly off ideal levels- he prefers to get LDL rechecked. For now continue current meds   # Hyperglycemia- A1c 5.29 March 2017- encouraged exercise on 10/23/17 visit S:medications: metformin 500 mg dailyb- 0.5 tablet daily with breakfast. Restarted April 5th, 2022 after A1c trended back up -weight down slightly as above Lab Results  Component Value Date   HGBA1C 6.2 07/13/2020   HGBA1C 5.7 (H) 12/28/2019   HGBA1C 6.0 07/07/2019   A/P: Hopefully stable or improved-update A1c with labs today-for now continue metformin  # OCD S:Medication: Lexapro 10 mg A/P: seems to be doing reasonably well lately- continue current meds   Recommended follow up: Return in about 6 months (around 08/05/2021) for physical or sooner if needed. Future Appointments  Date Time Provider [place]  02/09/2021  1:00 PM [name], [name], MD [facility] None  03/09/2021  1:00 PM [facility] HEALTH COACH [facility]  03/26/2021  3:45 PM [facility] CCM PHARMACIST [facility]  09/26/2021  9:15 AM Hayden Pedro, MD [facility] None   Lab/Order associations: non dairy creamer, cereal, english muffin   ICD-10-CM   1. Primary hypertension  I10 CBC with Differential/Platelet     Comprehensive metabolic panel    LDL cholesterol, direct    2. Hyperlipidemia, unspecified hyperlipidemia type  E78.5 CBC with Differential/Platelet    Comprehensive metabolic panel    LDL cholesterol, direct    3. Hyperglycemia  R73.9 Hemoglobin A1c    4. Hyperthyroidism  E05.90 TSH    T4, free    5. Attention deficit disorder (ADD) in adult  F98.8      No orders of the defined types were placed in this encounter.  I,Jada Bradford,acting as a scribe for [name], MD.,have documented all relevant documentation on the behalf of  [name], MD,as directed by  [name], MD while in the presence of [name], MD.  I, [name], MD, have reviewed all documentation for this visit. The documentation on 02/05/21 for the exam, diagnosis, procedures, and orders are all accurate and complete.   Return precautions advised.  [name], MD

## 2021-01-30 ENCOUNTER — Ambulatory Visit: Payer: PPO | Admitting: Family Medicine

## 2021-01-31 ENCOUNTER — Ambulatory Visit: Payer: PPO | Admitting: Internal Medicine

## 2021-02-05 ENCOUNTER — Other Ambulatory Visit: Payer: Self-pay

## 2021-02-05 ENCOUNTER — Ambulatory Visit (INDEPENDENT_AMBULATORY_CARE_PROVIDER_SITE_OTHER): Payer: PPO | Admitting: Family Medicine

## 2021-02-05 ENCOUNTER — Encounter: Payer: Self-pay | Admitting: Internal Medicine

## 2021-02-05 ENCOUNTER — Encounter: Payer: Self-pay | Admitting: Family Medicine

## 2021-02-05 VITALS — BP 120/60 | HR 66 | Temp 98.0°F | Ht 69.0 in | Wt 169.8 lb

## 2021-02-05 DIAGNOSIS — F988 Other specified behavioral and emotional disorders with onset usually occurring in childhood and adolescence: Secondary | ICD-10-CM

## 2021-02-05 DIAGNOSIS — E0590 Thyrotoxicosis, unspecified without thyrotoxic crisis or storm: Secondary | ICD-10-CM

## 2021-02-05 DIAGNOSIS — I10 Essential (primary) hypertension: Secondary | ICD-10-CM

## 2021-02-05 DIAGNOSIS — E785 Hyperlipidemia, unspecified: Secondary | ICD-10-CM | POA: Diagnosis not present

## 2021-02-05 DIAGNOSIS — R739 Hyperglycemia, unspecified: Secondary | ICD-10-CM

## 2021-02-05 DIAGNOSIS — R011 Cardiac murmur, unspecified: Secondary | ICD-10-CM

## 2021-02-05 LAB — COMPREHENSIVE METABOLIC PANEL
ALT: 35 U/L (ref 0–53)
AST: 35 U/L (ref 0–37)
Albumin: 4.2 g/dL (ref 3.5–5.2)
Alkaline Phosphatase: 68 U/L (ref 39–117)
BUN: 22 mg/dL (ref 6–23)
CO2: 27 mEq/L (ref 19–32)
Calcium: 9 mg/dL (ref 8.4–10.5)
Chloride: 106 mEq/L (ref 96–112)
Creatinine, Ser: 0.83 mg/dL (ref 0.40–1.50)
GFR: 85.05 mL/min (ref >60.00)
Glucose, Bld: 63 mg/dL — ABNORMAL LOW (ref 70–99)
Potassium: 4.7 mEq/L (ref 3.5–5.1)
Sodium: 139 mEq/L (ref 135–145)
Total Bilirubin: 1 mg/dL (ref 0.2–1.2)
Total Protein: 6.7 g/dL (ref 6.0–8.3)

## 2021-02-05 LAB — CBC WITH DIFFERENTIAL/PLATELET
Basophils Absolute: 0 10*3/uL (ref 0.0–0.1)
Basophils Relative: 0.5 % (ref 0.0–3.0)
Eosinophils Absolute: 0.2 10*3/uL (ref 0.0–0.7)
Eosinophils Relative: 2.5 % (ref 0.0–5.0)
HCT: 44.3 % (ref 39.0–52.0)
Hemoglobin: 14.3 g/dL (ref 13.0–17.0)
Lymphocytes Relative: 27.9 % (ref 12.0–46.0)
Lymphs Abs: 1.7 10*3/uL (ref 0.7–4.0)
MCHC: 32.3 g/dL (ref 30.0–36.0)
MCV: 91 fl (ref 78.0–100.0)
Monocytes Absolute: 0.7 10*3/uL (ref 0.1–1.0)
Monocytes Relative: 12 % (ref 3.0–12.0)
Neutro Abs: 3.5 10*3/uL (ref 1.4–7.7)
Neutrophils Relative %: 57.1 % (ref 43.0–77.0)
Platelets: 249 10*3/uL (ref 150.0–400.0)
RBC: 4.87 Mil/uL (ref 4.22–5.81)
RDW: 15 % (ref 11.5–15.5)
WBC: 6.1 10*3/uL (ref 4.0–10.5)

## 2021-02-05 LAB — HEMOGLOBIN A1C: Hgb A1c MFr Bld: 6 % (ref 4.6–6.5)

## 2021-02-05 LAB — T4, FREE: Free T4: 1.07 ng/dL (ref 0.60–1.60)

## 2021-02-05 LAB — TSH: TSH: 0.02 u[IU]/mL — ABNORMAL LOW (ref 0.35–5.50)

## 2021-02-05 LAB — LDL CHOLESTEROL, DIRECT: Direct LDL: 56 mg/dL

## 2021-02-05 NOTE — Patient Instructions (Addendum)
Please stop by lab before you go If you have mychart- we will send your results within 3 business days of [place] receiving them.  If you do not have mychart- we will call you about results within 5 business days of [place] receiving them.  *please also note that you will see labs on mychart as soon as they post. I will later go in and write notes on them- will say "notes from Dr. Yong Channel"   Recommended follow up: Return in about 6 months (around 08/05/2021) for physical or sooner if needed.  I love that you're doing pilates! Please keep up the great work! Great job on mild weight loss

## 2021-02-07 DIAGNOSIS — E119 Type 2 diabetes mellitus without complications: Secondary | ICD-10-CM | POA: Diagnosis not present

## 2021-02-07 DIAGNOSIS — H2513 Age-related nuclear cataract, bilateral: Secondary | ICD-10-CM | POA: Diagnosis not present

## 2021-02-07 DIAGNOSIS — H532 Diplopia: Secondary | ICD-10-CM | POA: Diagnosis not present

## 2021-02-07 DIAGNOSIS — H43813 Vitreous degeneration, bilateral: Secondary | ICD-10-CM | POA: Diagnosis not present

## 2021-02-07 DIAGNOSIS — H40013 Open angle with borderline findings, low risk, bilateral: Secondary | ICD-10-CM | POA: Diagnosis not present

## 2021-02-09 ENCOUNTER — Other Ambulatory Visit: Payer: Self-pay

## 2021-02-09 ENCOUNTER — Encounter: Payer: Self-pay | Admitting: Internal Medicine

## 2021-02-09 ENCOUNTER — Ambulatory Visit (INDEPENDENT_AMBULATORY_CARE_PROVIDER_SITE_OTHER): Payer: PPO | Admitting: Internal Medicine

## 2021-02-09 VITALS — BP 130/70 | HR 60 | Ht 69.0 in | Wt 172.6 lb

## 2021-02-09 DIAGNOSIS — E0500 Thyrotoxicosis with diffuse goiter without thyrotoxic crisis or storm: Secondary | ICD-10-CM | POA: Diagnosis not present

## 2021-02-09 MED ORDER — METHIMAZOLE 5 MG PO TABS
7.5000 mg | ORAL_TABLET | Freq: Every day | ORAL | 2 refills | Status: DC
Start: 2021-02-09 — End: 2021-03-16

## 2021-02-09 NOTE — Progress Notes (Signed)
Name: Darius Long.  MRN/ DOB: [id number], July 22, 1945    Age/ Sex: 76 y.o., male     PCP: [name], MD   Reason for Endocrinology Evaluation: Hyperthyroidism     Initial Endocrinology Clinic Visit: 03/15/2020    PATIENT IDENTIFIER: Darius Long. is a 76 y.o., male with a past medical history of HTN, dyslipidemia and OCD. He has followed with [facility] since 03/15/2020 for consultative assistance with management of his Hyperthyroidism      HISTORICAL SUMMARY:  They moved from [facility], [place] in May 2019  He was diagnosed with hypothyroidism ~ 15 yrs ago and was on LT-4  Until 02/03/2019     Over the summer/fall  of 2021 he has noted weight loss as well as decrease muscle mass. His LT-4 replacement has been gradually reduced until fully discontinued  On 02/03/2019   No amiodarone use   Methimazole started 02/2020   No FH of thyroid disease     SUBJECTIVE:     Today (02/09/2021):  Darius Long is here for Hyperthyroidism.     Weight continues to  be  stable  Denies abdominal pain  Denies palpitations  Denies burning or itching of the eyes   No local neck symptoms     Methimazole 5 mg 1.5 tab daily   HISTORY:  Past Medical History:  Past Medical History:  Diagnosis Date   Arthritis    Attention deficit disorder (ADD) in adult    Adderall 30mg  tablet- half tablet twice a day.    Chicken pox    Colon polyp    dad died of colon cancer- q5 year colonoscopy. 2018   Encounter for blood transfusion    related to overtreatment with PCN. became very ill.    Heart murmur    Sees cardiology October each year due to murmur after fall- get records. Seen for heart murmur heard 2010. We discused may not need to see cardiology. Every other year stres test   Hyperglycemia    a1c 5.9 in 03/2017   Hyperlipidemia    atorvastatin 40mg    Hypertension    losartan 50, hctz 12.5mg    Hypothyroidism    levothyroxine 100 mcg 6 days a  week as of mid 2019.    Seasonal allergies    prn antihistamine   Past Surgical History:  Past Surgical History:  Procedure Laterality Date   Arthroscopic surgery Left 1986-1996   Damaged cartilage Left knee   BACK SURGERY Bilateral 2008   Injury due to a fall. 2 fractured vertebrae, 3 fractured ribs fracture L scapulla, mild concussion   CHOLECYSTECTOMY  2014   Relocation surgery Right    For right elbow ulna nerve    REPAIR of torn retina Right 2014   TONSILECTOMY, ADENOIDECTOMY, BILATERAL MYRINGOTOMY AND TUBES  1958   vena Laser   2009   Remaoval of varicous veins   Social History:  reports that he has never smoked. He has never used smokeless tobacco. He reports that he does not currently use alcohol. He reports that he does not use drugs. Family History:  Family History  Problem Relation Age of Onset   Arthritis Mother        passed at age 65   Miscarriages / [place] Mother    Hearing loss Father    Colon cancer Father        died at 45   Other Daughter  diabetes educator   Heart disease Paternal 71 / Stillbirths Daughter    Diabetes Mellitus I Daughter        in [place]: Allergies as of 02/09/2021   No Known Allergies      Medication List        Accurate as of February 09, 2021  1:12 PM. If you have any questions, ask your nurse or doctor.          atorvastatin 40 MG tablet Commonly known as: LIPITOR Take 1 tablet (40 mg total) by mouth daily.   CHONDROITIN SULFATE PO Take 200 mg by mouth daily.   CITRACAL + D PO Take 25 mcg by mouth daily.   diclofenac Sodium 1 % Gel Commonly known as: VOLTAREN APPLY 2 GRAMS TO AFFECTED AREAS EVERY NIGHT AT BEDTIME   escitalopram 10 MG tablet Commonly known as: LEXAPRO TAKE ONE TABLET BY MOUTH DAILY   fluticasone 50 MCG/ACT nasal spray Commonly known as: FLONASE Place 1 spray into both nostrils daily.   Glucosamine HCl 1500 MG Tabs Take 1,500 mg  by mouth daily.   HYALURONIC ACID PO Take 3.3 mg by mouth daily.   hydrochlorothiazide 12.5 MG capsule Commonly known as: MICROZIDE Take 1 capsule (12.5 mg total) by mouth daily.   latanoprost 0.005 % ophthalmic solution Commonly known as: XALATAN Place 1 drop into both eyes at bedtime.   MAGNESIUM PO Take 72 mg by mouth.   metFORMIN 500 MG tablet Commonly known as: GLUCOPHAGE Take 0.5 tablets (250 mg total) by mouth daily with breakfast.   methimazole 5 MG tablet Commonly known as: TAPAZOLE Take 1.5 tablets (7.5 mg total) by mouth daily. What changed: how much to take Changed by: Dorita Sciara, MD   OMEGA-3 FISH OIL PO Take by mouth.   OVER THE COUNTER MEDICATION Magtein   PROBIOTIC ADVANCED PO Take 2 tablets by mouth daily.   Turmeric Curcumin 05-998 MG Caps Take 1 capsule by mouth daily.   valsartan 320 MG tablet Commonly known as: DIOVAN Take 1 tablet (320 mg total) by mouth daily.   vitamin E 180 MG (400 UNITS) capsule Take 800 Units by mouth daily.          OBJECTIVE:   PHYSICAL EXAM: VS: BP (!) 160/82 (BP Location: Left Arm, Patient Position: Sitting, Cuff Size: Small)    Pulse 60    Ht 5\' 9"  (1.753 m)    Wt 172 lb 9.6 oz (78.3 kg)    SpO2 97%    BMI 25.49 kg/m    EXAM: General: Pt appears well and is in NAD  Eyes: External eye exam normal without stare, lid lag or exophthalmos.  EOM intact  Neck: General: Supple without adenopathy. Thyroid: Left thyroid nodule appreciated  Lungs: Clear with good BS bilat with no rales, rhonchi, or wheezes  Heart: Auscultation: RRR.  Abdomen: Normoactive bowel sounds, soft, nontender, without masses or organomegaly palpable  Extremities:  BL LE: No pretibial edema normal ROM and strength.  Mental Status: Judgment, insight: Intact Orientation: Oriented to time, place, and person Mood and affect: No depression, anxiety, or agitation     DATA REVIEWED:  Latest Reference Range & Units 02/05/21 11:19   TSH 0.35 - 5.50 uIU/mL 0.02 (L)  T4,Free(Direct) 0.60 - 1.60 ng/dL 1.07     Latest Reference Range & Units 02/05/21 11:19  Sodium 135 - 145 mEq/L 139  Potassium 3.5 - 5.1 mEq/L 4.7  Chloride  96 - 112 mEq/L 106  CO2 19 - 32 mEq/L 27  Glucose 70 - 99 mg/dL 63 (L)  BUN 6 - 23 mg/dL 22  Creatinine 0.40 - 1.50 mg/dL 0.83  Calcium 8.4 - 10.5 mg/dL 9.0  Alkaline Phosphatase 39 - 117 U/L 68  Albumin 3.5 - 5.2 g/dL 4.2  AST 0 - 37 U/L 35  ALT 0 - 53 U/L 35  Total Protein 6.0 - 8.3 g/dL 6.7  Total Bilirubin 0.2 - 1.2 mg/dL 1.0  GFR >60.00 mL/min 85.05    Latest Reference Range & Units 02/05/21 11:19  WBC 4.0 - 10.5 K/uL 6.1  RBC 4.22 - 5.81 Mil/uL 4.87  Hemoglobin 13.0 - 17.0 g/dL 14.3  HCT 39.0 - 52.0 % 44.3  MCV 78.0 - 100.0 fl 91.0  MCHC 30.0 - 36.0 g/dL 32.3  RDW 11.5 - 15.5 % 15.0  Platelets 150.0 - 400.0 K/uL 249.0  Neutrophils 43.0 - 77.0 % 57.1  Lymphocytes 12.0 - 46.0 % 27.9  Monocytes Relative 3.0 - 12.0 % 12.0  Eosinophil 0.0 - 5.0 % 2.5  Basophil 0.0 - 3.0 % 0.5  NEUT# 1.4 - 7.7 K/uL 3.5  Lymphocyte # 0.7 - 4.0 K/uL 1.7  Monocyte # 0.1 - 1.0 K/uL 0.7  Eosinophils Absolute 0.0 - 0.7 K/uL 0.2  Basophils Absolute 0.0 - 0.1 K/uL 0.0      Results for ARGIL, MAHL (MRN [id number]) as of 06/12/2020 09:57  Ref. Range 03/15/2020 12:16  TRAB Latest Ref Range: <=2.00 IU/L 18.65 (H)   Thyroid ULtrasound 04/04/2020    Parenchymal Echotexture: Moderately heterogenous   Isthmus: 0.4 cm   Right lobe: 4.8 x 2.3 x 2.0 cm   Left lobe: 4.1 x 2.2 x 1.7 cm   _________________________________________________________   Estimated total number of nodules >/= 1 cm: 0   Number of spongiform nodules >/=  2 cm not described below (TR1): 0   Number of mixed cystic and solid nodules >/= 1.5 cm not described below (TR2): 0   _________________________________________________________   Nodule # 1:   Location: Left; Superior   Maximum size: 0.7 cm; Other 2  dimensions: 0.6 x 0.5 cm   Composition: solid/almost completely solid (2)   Echogenicity: isoechoic (1)   Shape: not taller-than-wide (0)   Margins: smooth (0)   Echogenic foci: none (0)   ACR TI-RADS total points: 3.   ACR TI-RADS risk category: TR3 (3 points).   ACR TI-RADS recommendations:   Given size (<1.4 cm) and appearance, this nodule does NOT meet TI-RADS criteria for biopsy or dedicated follow-up.   _________________________________________________________   IMPRESSION: 1. Moderately heterogeneous, normal-sized thyroid gland. 2. Single solid left upper thyroid nodule (labeled 1, 0.7 cm) which does not meet criteria (TI-RADS category 3) for dedicated ultrasound follow-up or tissue sampling.   The above is in keeping with the ACR TI-RADS recommendations - J Am Coll Radiol 2017;14:587-595.   Ruthann Cancer, MD   [facility]   [facility]     ASSESSMENT / PLAN / RECOMMENDATIONS:   Hyperthyroidism:   -Patient with Graves' disease but he also has a left thyroid nodule which could also be contributing to hyperthyroidism -Patient is clinically euthyroid -No local neck symptoms -Due to low TSH and normal free T4 we have increased methimazole a few days ago  Medications  Continue methimazole 5 mg, 1.5  tablet daily    2. Graves' Disease :  -No extrathyroidal manifestations of Graves' disease.  He is  up-to-date on eye exams    3. Left Thyroid Nodule:  -No local neck symptoms -This is too small to follow-up  Follow-up in 4 months Labs in 6 weeks   Signed electronically by: Mack Guise, MD  [facility]  [address], [place] [address] Phone: [phone] FAX: [phone]      CC: [name], [address] [place] [address] Phone: [phone]  Fax: [phone]   Return to Endocrinology clinic as  below: Future Appointments  Date Time Provider [place]  03/09/2021  1:00 PM [facility] HEALTH COACH [facility]  03/26/2021  3:45 PM [facility] CCM PHARMACIST [facility]  08/07/2021  1:00 PM [name], MD [facility]  09/26/2021  9:15 AM Hayden Pedro, MD [facility] None

## 2021-02-09 NOTE — Patient Instructions (Signed)
Methimazole 5 mg , 1.5 tablets daily

## 2021-02-09 NOTE — Telephone Encounter (Signed)
okay

## 2021-02-10 ENCOUNTER — Other Ambulatory Visit: Payer: Self-pay | Admitting: Internal Medicine

## 2021-03-08 ENCOUNTER — Other Ambulatory Visit: Payer: Self-pay | Admitting: Family Medicine

## 2021-03-08 ENCOUNTER — Encounter: Payer: Self-pay | Admitting: Family Medicine

## 2021-03-08 ENCOUNTER — Other Ambulatory Visit: Payer: Self-pay | Admitting: *Deleted

## 2021-03-08 MED ORDER — HYDROCHLOROTHIAZIDE 12.5 MG PO CAPS: 12.5000 mg | ORAL_CAPSULE | Freq: Every day | ORAL | 3 refills | Status: DC

## 2021-03-09 ENCOUNTER — Ambulatory Visit (INDEPENDENT_AMBULATORY_CARE_PROVIDER_SITE_OTHER): Payer: PPO

## 2021-03-09 ENCOUNTER — Other Ambulatory Visit: Payer: Self-pay

## 2021-03-09 DIAGNOSIS — Z0000 Encounter for general adult medical examination without abnormal findings: Secondary | ICD-10-CM

## 2021-03-09 NOTE — Progress Notes (Signed)
Virtual Visit via Telephone Note  I connected with  Darius Long. on 03/09/21 at  1:00 PM EST by telephone and verified that I am speaking with the correct person using two identifiers.  Medicare Annual Wellness visit completed telephonically due to Covid-19 pandemic.   Persons participating in this call: This Health Coach and this patient.   Location: Patient: Home Provider: Office    I discussed the limitations, risks, security and privacy concerns of performing an evaluation and management service by telephone and the availability of in person appointments. The patient expressed understanding and agreed to proceed.  Unable to perform video visit due to video visit attempted and failed and/or patient does not have video capability.   Some vital signs may be absent or patient reported.   Willette Brace, LPN   Subjective:   Darius Long. is a 76 y.o. male who presents for Medicare Annual/Subsequent preventive examination.  Review of Systems     Cardiac Risk Factors include: advanced age (>20men, >20 women);hypertension;dyslipidemia;male gender     Objective:    There were no vitals filed for this visit. There is no height or weight on file to calculate BMI.  Advanced Directives 03/09/2021 03/03/2020 12/01/2018  Does Patient Have a Medical Advance Directive? Yes Yes Yes  Type of Advance Directive Healthcare Power of [place]  Does patient want to make changes to medical advance directive? - - No - Patient declined  Copy of Short in Chart? Yes - validated most recent copy scanned in chart (See row information) Yes - validated most recent copy scanned in chart (See row information) Yes - validated most recent copy scanned in chart (See row information)    Current Medications (verified) Outpatient Encounter Medications as of 03/09/2021  Medication Sig   atorvastatin  (LIPITOR) 40 MG tablet Take 1 tablet (40 mg total) by mouth daily.   Black Pepper-Turmeric (TURMERIC CURCUMIN) 05-998 MG CAPS Take 1 capsule by mouth daily.   Calcium Citrate-Vitamin D (CITRACAL + D PO) Take 25 mcg by mouth daily.   CHONDROITIN SULFATE PO Take 200 mg by mouth daily.   diclofenac Sodium (VOLTAREN) 1 % GEL APPLY 2 GRAMS TO AFFECTED AREAS EVERY NIGHT AT BEDTIME   escitalopram (LEXAPRO) 10 MG tablet TAKE ONE TABLET BY MOUTH DAILY   fluticasone (FLONASE) 50 MCG/ACT nasal spray Place 1 spray into both nostrils daily.   Glucosamine HCl 1500 MG TABS Take 1,500 mg by mouth daily.   Hyaluronic Acid-Vitamin C (HYALURONIC ACID PO) Take 3.3 mg by mouth daily.   hydrochlorothiazide (MICROZIDE) 12.5 MG capsule Take 1 capsule (12.5 mg total) by mouth daily.   latanoprost (XALATAN) 0.005 % ophthalmic solution Place 1 drop into both eyes at bedtime.   MAGNESIUM PO Take 72 mg by mouth.   metFORMIN (GLUCOPHAGE) 500 MG tablet Take 0.5 tablets (250 mg total) by mouth daily with breakfast.   methimazole (TAPAZOLE) 5 MG tablet Take 1.5 tablets (7.5 mg total) by mouth daily.   Omega-3 Fatty Acids (OMEGA-3 FISH OIL PO) Take by mouth.   OVER THE COUNTER MEDICATION Magtein   Probiotic Product (PROBIOTIC ADVANCED PO) Take 2 tablets by mouth daily.   valsartan (DIOVAN) 320 MG tablet Take 1 tablet (320 mg total) by mouth daily.   vitamin E 400 UNIT capsule Take 800 Units by mouth daily.   No facility-administered encounter medications on file as of 03/09/2021.    Allergies (  verified) Patient has no known allergies.   History: Past Medical History:  Diagnosis Date   Arthritis    Attention deficit disorder (ADD) in adult    Adderall 61m tablet- half tablet twice a day.    Chicken pox    Colon polyp    dad died of colon cancer- q5 year colonoscopy. 2018   Encounter for blood transfusion    related to overtreatment with PCN. became very ill.    Heart murmur    Sees cardiology October each year  due to murmur after fall- get records. Seen for heart murmur heard 2010. We discused may not need to see cardiology. Every other year stres test   Hyperglycemia    a1c 5.9 in 03/2017   Hyperlipidemia    atorvastatin 472m  Hypertension    losartan 50, hctz 12.2m81m Hypothyroidism    levothyroxine 100 mcg 6 days a week as of mid 2019.    Seasonal allergies    prn antihistamine   Past Surgical History:  Procedure Laterality Date   Arthroscopic surgery Left 1986-1996   Damaged cartilage Left knee   BACK SURGERY Bilateral 2008   Injury due to a fall. 2 fractured vertebrae, 3 fractured ribs fracture L scapulla, mild concussion   CHOLECYSTECTOMY  2014   Relocation surgery Right    For right elbow ulna nerve    REPAIR of torn retina Right 2014   TONSILECTOMY, ADENOIDECTOMY, BILATERAL MYRINGOTOMY AND TUBES  1958   vena Laser   2009   Remaoval of varicous veins   Family History  Problem Relation Age of Onset   Arthritis Mother        passed at age 54 51   MiscarriagesStiKoreather    Hearing loss Father    Colon cancer Father        died at 69 38Other Daughter        diabetes educator   Heart disease Paternal Gra10StiKoreaughter    Diabetes Mellitus I Daughter        in colMullenSocial History   Socioeconomic History   Marital status: Married    Spouse name: Not on file   Number of children: Not on file   Years of education: Not on file   Highest education level: Not on file  Occupational History   Occupation: Retired   Tobacco Use   Smoking status: Never   Smokeless tobacco: Never  Vaping Use   Vaping Use: Never used  Substance and Sexual Activity   Alcohol use: Not Currently   Drug use: Never   Sexual activity: Not Currently  Other Topics Concern   Not on file  Social History Narrative   Married. Twin daughters. 4 grandkids (2 each)   [place]   Retired from engConservation officer, historic buildings    Hobbies: time with grandkids, enjoys travel   Social Determinants of HeaRadio broadcast assistantrain: Low Risk    Difficulty of Paying Living Expenses: Not hard at all  Food Insecurity: No Food Insecurity   Worried About RunCharity fundraiser the Last Year: Never true   RanArboriculturist the Last Year: Never true  Transportation Needs: No Transportation Needs   Lack of Transportation (Medical): No   Lack of Transportation (Non-Medical): No  Physical Activity: Sufficiently Active   Days of Exercise  per Week: 2 days   Minutes of Exercise per Session: 90 min  Stress: No Stress Concern Present   Feeling of Stress : Not at all  Social Connections: Moderately Integrated   Frequency of Communication with Friends and Family: More than three times a week   Frequency of Social Gatherings with Friends and Family: More than three times a week   Attends Religious Services: More than 4 times per year   Active Member of [employer] or Organizations: No   Attends Music therapist: Never   Marital Status: Married    Tobacco Counseling Counseling given: Not Answered   Clinical Intake:  Pre-visit preparation completed: Yes  Pain : No/denies pain     BMI - recorded: 25.49 Nutritional Status: BMI 25 -29 Overweight Nutritional Risks: None Diabetes: No  How often do you need to have someone help you when you read instructions, pamphlets, or other written materials from your doctor or pharmacy?: 1 - Never  Diabetic?no  Interpreter Needed?: No  Information entered by :: Charlott Rakes, LPN   Activities of Daily Living In your present state of health, do you have any difficulty performing the following activities: 03/09/2021  Hearing? N  Vision? N  Difficulty concentrating or making decisions? N  Walking or climbing stairs? N  Dressing or bathing? N  Doing errands, shopping? N  Preparing Food and eating ? N  Using the Toilet? N  In the past six months, have you  accidently leaked urine? N  Do you have problems with loss of bowel control? N  Managing your Medications? N  Managing your Finances? N  Housekeeping or managing your Housekeeping? N  Some recent data might be hidden    Patient Care Team: [name], MD as PCP - General (Family Medicine) [facility] as Consulting Physician Croitoru, Dani Gobble, MD as Consulting Physician (Cardiology) Hayden Pedro, MD as Consulting Physician (Ophthalmology) Edythe Clarity, [facility] (Pharmacist)  Indicate any recent Medical Services you may have received from other than [facility] providers in the past year (date may be approximate).     Assessment:   This is a routine wellness examination for Iam.  Hearing/Vision screen Hearing Screening - Comments:: Pt denies any hearing issues  Vision Screening - Comments:: Pt follows UP WITH Dr Warden Fillers for annual eye exams   Dietary issues and exercise activities discussed: Current Exercise Habits: Structured exercise class, Type of exercise: Other - see comments (pilates and gym), Time (Minutes): > 60, Frequency (Times/Week): 2, Weekly Exercise (Minutes/Week): 0   Goals Addressed             This Visit's Progress    Patient Stated       None at this time        Depression Screen PHQ 2/9 Scores 03/09/2021 07/13/2020 03/03/2020 02/02/2020 07/09/2019 02/05/2018 01/01/2018  PHQ - 2 Score 0 0 0 0 0 1 1  PHQ- 9 Score - - - - - 3 4    Fall Risk Fall Risk  03/09/2021 12/18/2020 07/13/2020 03/03/2020 12/28/2019  Falls in the past year? 0 0 0 0 0  Comment - - - - -  Number falls in past yr: 0 - 0 0 0  Injury with Fall? 0 - 0 0 0  Risk for fall due to : Impaired vision - No Fall Risks Impaired vision -  Follow up Falls prevention discussed - Falls evaluation completed Falls prevention discussed -    FALL RISK PREVENTION PERTAINING  TO THE HOME:  Any stairs in or around the home? Yes  If so, are there any without handrails? No   Home free of loose throw rugs in walkways, pet beds, electrical cords, etc? Yes  Adequate lighting in your home to reduce risk of falls? Yes   ASSISTIVE DEVICES UTILIZED TO PREVENT FALLS:  Life alert? No  Use of a cane, walker or w/c? No  Grab bars in the bathroom? Yes  Shower chair or bench in shower? Yes  Elevated toilet seat or a handicapped toilet? No   TIMED UP AND GO:  Was the test performed? No .   Cognitive Function:     6CIT Screen 03/09/2021 03/03/2020 12/01/2018  What Year? 0 points 0 points 0 points  What month? 0 points 0 points 0 points  What time? 0 points - 0 points  Count back from 20 0 points 0 points 0 points  Months in reverse 0 points 0 points 0 points  Repeat phrase 0 points 0 points 0 points  Total Score 0 - 0    Immunizations Immunization History  Administered Date(s) Administered   Fluad Quad(high Dose 65+) 09/22/2018, 10/13/2019   Influenza, High Dose Seasonal PF 10/10/2017, 10/27/2020   MMR 04/14/2013   Moderna Covid-19 Vaccine Bivalent Booster 42yr & up 05/18/2020   Moderna SARS-COV2 Booster Vaccination 05/18/2020   PFIZER(Purple Top)SARS-COV-2 Vaccination 02/28/2019, 03/25/2019, 10/22/2019, 11/13/2020   PNEUMOCOCCAL CONJUGATE-20 07/13/2020   Pneumococcal Conjugate-13 07/01/2013   Pneumococcal Polysaccharide-23 01/03/2012   Tdap 05/17/2013   Zoster Recombinat (Shingrix) 10/12/2017, 02/17/2018   Zoster, Live 08/02/2010    TDAP status: Up to date  Flu Vaccine status: Up to date  Pneumococcal vaccine status: Up to date  Covid-19 vaccine status: Completed vaccines  Qualifies for Shingles Vaccine? Yes   Zostavax completed Yes   Shingrix Completed?: Yes  Screening Tests Health Maintenance  Topic Date Due   COLONOSCOPY (Pts 45-49yrInsurance coverage will need to be confirmed)  11/12/2021   TETANUS/TDAP  05/18/2023   Pneumonia Vaccine 77Years old  Completed   INFLUENZA VACCINE  Completed   COVID-19 Vaccine  Completed    Hepatitis C Screening  Completed   Zoster Vaccines- Shingrix  Completed   HPV VACCINES  Aged Out    Health Maintenance  There are no preventive care reminders to display for this patient.   Colorectal cancer screening: Type of screening: Colonoscopy. Completed 11/12/16. Repeat every 5 years  Additional Screening:  Hepatitis C Screening:  Completed 12/28/19  Vision Screening: Recommended annual ophthalmology exams for early detection of glaucoma and other disorders of the eye. Is the patient up to date with their annual eye exam?  Yes  Who is the provider or what is the name of the office in which the patient attends annual eye exams? Dr ChWarden FillersIf pt is not established with a provider, would they like to be referred to a provider to establish care? No .   Dental Screening: Recommended annual dental exams for proper oral hygiene  Community Resource Referral / Chronic Care Management: CRR required this visit?  No   CCM required this visit?  No      Plan:     I have personally reviewed and noted the following in the patients chart:   Medical and social history Use of alcohol, tobacco or illicit drugs  Current medications and supplements including opioid prescriptions. Patient is not currently taking opioid prescriptions. Functional ability and status Nutritional status Physical activity Advanced directives  List of other physicians Hospitalizations, surgeries, and ER visits in previous 12 months Vitals Screenings to include cognitive, depression, and falls Referrals and appointments  In addition, I have reviewed and discussed with patient certain preventive protocols, quality metrics, and best practice recommendations. A written personalized care plan for preventive services as well as general preventive health recommendations were provided to patient.     Willette Brace, LPN   02/14/869   Nurse Notes: None

## 2021-03-09 NOTE — Patient Instructions (Addendum)
Darius Long , Thank you for taking time to come for your Medicare Wellness Visit. I appreciate your ongoing commitment to your health goals. Please review the following plan we discussed and let me know if I can assist you in the future.   Screening recommendations/referrals: Colonoscopy: Done 11/12/16 repeat every 5 years  Recommended yearly ophthalmology/optometry visit for glaucoma screening and checkup Recommended yearly dental visit for hygiene and checkup  Vaccinations: Influenza vaccine: Done 10/27/20 repeat veery year  Pneumococcal vaccine: Up to date Tdap vaccine: Done 05/17/13  repeat every 10 years  Shingles vaccine: Completed 10/12/17 & 02/17/18 Covid-19: Completed 2/7, 3/4, 10/22/19, 05/18/20 & 11/13/20  Advanced directives: Copies in chart   Conditions/risks identified: None at this time   Next appointment: Follow up in one year for your annual wellness visit.   Preventive Care 20 Years and Older, Male Preventive care refers to lifestyle choices and visits with your health care provider that can promote health and wellness. What does preventive care include? A yearly physical exam. This is also called an annual well check. Dental exams once or twice a year. Routine eye exams. Ask your health care provider how often you should have your eyes checked. Personal lifestyle choices, including: Daily care of your teeth and gums. Regular physical activity. Eating a healthy diet. Avoiding tobacco and drug use. Limiting alcohol use. Practicing safe sex. Taking low doses of aspirin every day. Taking vitamin and mineral supplements as recommended by your health care provider. What happens during an annual well check? The services and screenings done by your health care provider during your annual well check will depend on your age, overall health, lifestyle risk factors, and family history of disease. Counseling  Your health care provider may ask you questions about your: Alcohol  use. Tobacco use. Drug use. Emotional well-being. Home and relationship well-being. Sexual activity. Eating habits. History of falls. Memory and ability to understand (cognition). Work and work Statistician. Screening  You may have the following tests or measurements: Height, weight, and BMI. Blood pressure. Lipid and cholesterol levels. These may be checked every 5 years, or more frequently if you are over 76 years old. Skin check. Lung cancer screening. You may have this screening every year starting at age 42 if you have a 30-pack-year history of smoking and currently smoke or have quit within the past 15 years. Fecal occult blood test (FOBT) of the stool. You may have this test every year starting at age 92. Flexible sigmoidoscopy or colonoscopy. You may have a sigmoidoscopy every 5 years or a colonoscopy every 10 years starting at age 25. Prostate cancer screening. Recommendations will vary depending on your family history and other risks. Hepatitis C blood test. Hepatitis B blood test. Sexually transmitted disease (STD) testing. Diabetes screening. This is done by checking your blood sugar (glucose) after you have not eaten for a while (fasting). You may have this done every 1-3 years. Abdominal aortic aneurysm (AAA) screening. You may need this if you are a current or former smoker. Osteoporosis. You may be screened starting at age 77 if you are at high risk. Talk with your health care provider about your test results, treatment options, and if necessary, the need for more tests. Vaccines  Your health care provider may recommend certain vaccines, such as: Influenza vaccine. This is recommended every year. Tetanus, diphtheria, and acellular pertussis (Tdap, Td) vaccine. You may need a Td booster every 10 years. Zoster vaccine. You may need this after age 67. Pneumococcal  13-valent conjugate (PCV13) vaccine. One dose is recommended after age 67. Pneumococcal polysaccharide  (PPSV23) vaccine. One dose is recommended after age 78. Talk to your health care provider about which screenings and vaccines you need and how often you need them. This information is not intended to replace advice given to you by your health care provider. Make sure you discuss any questions you have with your health care provider. Document Released: 02/03/2015 Document Revised: 09/27/2015 Document Reviewed: 11/08/2014 Elsevier Interactive Patient Education  2017 [place] Prevention in the Home Falls can cause injuries. They can happen to people of all ages. There are many things you can do to make your home safe and to help prevent falls. What can I do on the outside of my home? Regularly fix the edges of walkways and driveways and fix any cracks. Remove anything that might make you trip as you walk through a door, such as a raised step or threshold. Trim any bushes or trees on the path to your home. Use bright outdoor lighting. Clear any walking paths of anything that might make someone trip, such as rocks or tools. Regularly check to see if handrails are loose or broken. Make sure that both sides of any steps have handrails. Any raised decks and porches should have guardrails on the edges. Have any leaves, snow, or ice cleared regularly. Use sand or salt on walking paths during winter. Clean up any spills in your garage right away. This includes oil or grease spills. What can I do in the bathroom? Use night lights. Install grab bars by the toilet and in the tub and shower. Do not use towel bars as grab bars. Use non-skid mats or decals in the tub or shower. If you need to sit down in the shower, use a plastic, non-slip stool. Keep the floor dry. Clean up any water that spills on the floor as soon as it happens. Remove soap buildup in the tub or shower regularly. Attach bath mats securely with double-sided non-slip rug tape. Do not have throw rugs and other things on the  floor that can make you trip. What can I do in the bedroom? Use night lights. Make sure that you have a light by your bed that is easy to reach. Do not use any sheets or blankets that are too big for your bed. They should not hang down onto the floor. Have a firm chair that has side arms. You can use this for support while you get dressed. Do not have throw rugs and other things on the floor that can make you trip. What can I do in the kitchen? Clean up any spills right away. Avoid walking on wet floors. Keep items that you use a lot in easy-to-reach places. If you need to reach something above you, use a strong step stool that has a grab bar. Keep electrical cords out of the way. Do not use floor polish or wax that makes floors slippery. If you must use wax, use non-skid floor wax. Do not have throw rugs and other things on the floor that can make you trip. What can I do with my stairs? Do not leave any items on the stairs. Make sure that there are handrails on both sides of the stairs and use them. Fix handrails that are broken or loose. Make sure that handrails are as long as the stairways. Check any carpeting to make sure that it is firmly attached to the stairs. Fix any carpet  that is loose or worn. Avoid having throw rugs at the top or bottom of the stairs. If you do have throw rugs, attach them to the floor with carpet tape. Make sure that you have a light switch at the top of the stairs and the bottom of the stairs. If you do not have them, ask someone to add them for you. What else can I do to help prevent falls? Wear shoes that: Do not have high heels. Have rubber bottoms. Are comfortable and fit you well. Are closed at the toe. Do not wear sandals. If you use a stepladder: Make sure that it is fully opened. Do not climb a closed stepladder. Make sure that both sides of the stepladder are locked into place. Ask someone to hold it for you, if possible. Clearly mark and make  sure that you can see: Any grab bars or handrails. First and last steps. Where the edge of each step is. Use tools that help you move around (mobility aids) if they are needed. These include: Canes. Walkers. Scooters. Crutches. Turn on the lights when you go into a dark area. Replace any light bulbs as soon as they burn out. Set up your furniture so you have a clear path. Avoid moving your furniture around. If any of your floors are uneven, fix them. If there are any pets around you, be aware of where they are. Review your medicines with your doctor. Some medicines can make you feel dizzy. This can increase your chance of falling. Ask your doctor what other things that you can do to help prevent falls. This information is not intended to replace advice given to you by your health care provider. Make sure you discuss any questions you have with your health care provider. Document Released: 11/03/2008 Document Revised: 06/15/2015 Document Reviewed: 02/11/2014 Elsevier Interactive Patient Education  2017 [employer].

## 2021-03-16 ENCOUNTER — Other Ambulatory Visit: Payer: Self-pay

## 2021-03-16 ENCOUNTER — Other Ambulatory Visit (INDEPENDENT_AMBULATORY_CARE_PROVIDER_SITE_OTHER): Payer: PPO

## 2021-03-16 DIAGNOSIS — E0500 Thyrotoxicosis with diffuse goiter without thyrotoxic crisis or storm: Secondary | ICD-10-CM | POA: Diagnosis not present

## 2021-03-16 LAB — TSH: TSH: 0.3 u[IU]/mL — ABNORMAL LOW (ref 0.35–5.50)

## 2021-03-16 LAB — T4, FREE: Free T4: 0.61 ng/dL (ref 0.60–1.60)

## 2021-03-16 MED ORDER — METHIMAZOLE 5 MG PO TABS: 7.5000 mg | ORAL_TABLET | Freq: Every day | ORAL | 2 refills | Status: DC

## 2021-03-26 ENCOUNTER — Telehealth: Payer: PPO

## 2021-03-28 ENCOUNTER — Telehealth: Payer: Self-pay | Admitting: Pharmacist

## 2021-03-28 NOTE — Progress Notes (Unsigned)
Chronic Care Management Pharmacy Assistant   Name: Stephanos Fan.  MRN: [id number] DOB: 03-07-1945   Reason for Encounter: Hypertension Adherence Call    Recent office visits:  02/05/2021 OV (PCP) [name], MD; no medication changes indicated.  12/18/2020 OV (Family Medicine) Jeanie Sewer, NP; Sending Flonase nasal spray to start, pt advised on use & SE. Wife also present uses Flonase. Ok to also take generic Claritin or zyrtec qd.   Recent consult visits:  02/09/2021 OV (Endocrinology) [name], [name], MD; Due to low TSH and normal free T4 we have increased methimazole a few days ago  Hospital visits:  None in previous 6 months  Medications: Outpatient Encounter Medications as of 03/28/2021  Medication Sig Note   atorvastatin (LIPITOR) 40 MG tablet Take 1 tablet (40 mg total) by mouth daily.    Black Pepper-Turmeric (TURMERIC CURCUMIN) 05-998 MG CAPS Take 1 capsule by mouth daily.    Calcium Citrate-Vitamin D (CITRACAL + D PO) Take 25 mcg by mouth daily.    CHONDROITIN SULFATE PO Take 200 mg by mouth daily.    diclofenac Sodium (VOLTAREN) 1 % GEL APPLY 2 GRAMS TO AFFECTED AREAS EVERY NIGHT AT BEDTIME    escitalopram (LEXAPRO) 10 MG tablet TAKE ONE TABLET BY MOUTH DAILY    fluticasone (FLONASE) 50 MCG/ACT nasal spray Place 1 spray into both nostrils daily.    Glucosamine HCl 1500 MG TABS Take 1,500 mg by mouth daily.    Hyaluronic Acid-Vitamin C (HYALURONIC ACID PO) Take 3.3 mg by mouth daily.    hydrochlorothiazide (MICROZIDE) 12.5 MG capsule Take 1 capsule (12.5 mg total) by mouth daily.    latanoprost (XALATAN) 0.005 % ophthalmic solution Place 1 drop into both eyes at bedtime.    MAGNESIUM PO Take 72 mg by mouth.    metFORMIN (GLUCOPHAGE) 500 MG tablet Take 0.5 tablets (250 mg total) by mouth daily with breakfast.    methimazole (TAPAZOLE) 5 MG tablet Take 1.5 tablets (7.5 mg total) by mouth daily.    Omega-3 Fatty Acids (OMEGA-3 FISH OIL  PO) Take by mouth. 03/03/2020: '690mg'$  epa, dha    OVER THE COUNTER MEDICATION Magtein    Probiotic Product (PROBIOTIC ADVANCED PO) Take 2 tablets by mouth daily.    valsartan (DIOVAN) 320 MG tablet Take 1 tablet (320 mg total) by mouth daily.    vitamin E 400 UNIT capsule Take 800 Units by mouth daily.    No facility-administered encounter medications on file as of 03/28/2021.   Reviewed chart prior to disease state call. Spoke with patient regarding BP  Recent Office Vitals: BP Readings from Last 3 Encounters:  02/09/21 130/70  02/05/21 120/60  12/18/20 130/72   Pulse Readings from Last 3 Encounters:  02/09/21 60  02/05/21 66  12/18/20 (!) 58    Wt Readings from Last 3 Encounters:  02/09/21 172 lb 9.6 oz (78.3 kg)  02/05/21 169 lb 12.8 oz (77 kg)  12/18/20 174 lb 6.4 oz (79.1 kg)     Kidney Function Lab Results  Component Value Date/Time   CREATININE 0.83 02/05/2021 11:19 AM   CREATININE 0.97 07/13/2020 04:23 PM   CREATININE 0.65 (L) 12/28/2019 04:31 PM   GFR 85.05 02/05/2021 11:19 AM    BMP Latest Ref Rng & Units 02/05/2021 07/13/2020 03/08/2020  Glucose 70 - 99 mg/dL 63(L) 86 102(H)  BUN 6 - 23 mg/dL 22 22 25(H)  Creatinine 0.40 - 1.50 mg/dL 0.83 0.97 0.70  BUN/Creat Ratio 6 - 22 (  calc) - - -  Sodium 135 - 145 mEq/L 139 138 138  Potassium 3.5 - 5.1 mEq/L 4.7 4.1 4.2  Chloride 96 - 112 mEq/L 106 100 101  CO2 19 - 32 mEq/L 27 33(H) 29  Calcium 8.4 - 10.5 mg/dL 9.0 9.2 9.2    Current antihypertensive regimen:  Valsartan 320 mg daily HCTZ 12.5 mg daily  How often are you checking your Blood Pressure? {CHL HP BP Monitoring [phone]}  Current home BP readings: ***  What recent interventions/DTPs have been made by any provider to improve Blood Pressure control since last CPP Visit: No recent interventions or DTPs.  Any recent hospitalizations or ED visits since last visit with CPP? No  What diet changes have been made to improve Blood Pressure Control?   Patient states he has had some unintentional weight loss so he doesn't follow any diet and hasn't made any recent changes.  What exercise is being done to improve your Blood Pressure Control?  Goes to [employer] once a week. He also does pilates  once a week and is active doing yard work. He's currently on vacation at [facility].  Adherence Review: Is the patient currently on ACE/ARB medication? Yes Does the patient have >5 day gap between last estimated fill dates? No  Care Gaps: Medicare Annual Wellness: Completed 03/09/2021 Hemoglobin A1C: 6.0% on 02/05/2021 Colonoscopy: Next due on 11/12/2021  Future Appointments  Date Time Provider [place]  04/20/2021  8:30 AM [facility] CCM PHARMACIST [facility]  06/15/2021  8:10 AM [name], [name], MD [facility] None  08/17/2021 10:40 AM [name], MD [facility]  09/26/2021  9:15 AM Hayden Pedro, MD [facility] None  03/18/2022  1:00 PM [facility] HEALTH COACH [facility]    Star Rating Drugs: Atorvastatin 40 mg last filled 01/11/2021 90 DS Losartan 100 mg last filled 03/25/2020 90 DS Metformin 500 mg last filled 02/23/2021 90 DS  April D Calhoun, [place] Pharmacist Assistant [phone]

## 2021-04-20 ENCOUNTER — Telehealth: Payer: PPO

## 2021-05-22 ENCOUNTER — Other Ambulatory Visit: Payer: Self-pay | Admitting: Family Medicine

## 2021-05-24 NOTE — Progress Notes (Signed)
? ?Chronic Care Management ?Pharmacy Note ? ?Summary:  BP well controlled.  TSH still low, no change made to Methimazole.  Trending up from the previous report.  He is not losing weight.  No changes needed at this time. ? ?FU 1 year ? ?05/28/2021 ?Name:  Tiffany Talarico. MRN:  [id number] DOB:  Mar 07, 1945 ? ?Subjective: ?Darius Long. is an 76 y.o. year old male who is a primary patient of Hunter, Brayton Mars, MD.  The CCM team was consulted for assistance with disease management and care coordination needs.   ? ?Engaged with patient by telephone for follow up visit in response to provider referral for pharmacy case management and/or care coordination services.  ? ?Consent to Services:  ?The patient was given the following information about Chronic Care Management services today, agreed to services, and gave verbal consent: 1. CCM service includes personalized support from designated clinical staff supervised by the primary care provider, including individualized plan of care and coordination with other care providers 2. 24/7 contact phone numbers for assistance for urgent and routine care needs. 3. Service will only be billed when office clinical staff spend 20 minutes or more in a month to coordinate care. 4. Only one practitioner may furnish and bill the service in a calendar month. 5.The patient may stop CCM services at any time (effective at the end of the month) by phone call to the office staff. 6. The patient will be responsible for cost sharing (co-pay) of up to 20% of the service fee (after annual deductible is met). Patient agreed to services and consent obtained. ? ?Patient Care Team: ?[name], MD as PCP - General (Family Medicine) ?[facility] as Consulting Physician ?Croitoru, Dani Gobble, MD as Consulting Physician (Cardiology) ?Hayden Pedro, MD as Consulting Physician (Ophthalmology) ?Edythe Clarity, [facility] (Pharmacist) ? ?Recent office visits:  ?02/05/2021 OV  (PCP) [name], MD; no medication changes indicated. ?  ?12/18/2020 OV (Family Medicine) Jeanie Sewer, NP; Sending Flonase nasal spray to start, pt advised on use & SE. Wife also present uses Flonase. Ok to also take generic Claritin or zyrtec qd.  ?  ?Recent consult visits:  ?02/09/2021 OV (Endocrinology) [name], [name], MD; Due to low TSH and normal free T4 we have increased methimazole a few days ago ?  ?Hospital visits:  ?None in previous 6 months ? ?Objective: ? ?Lab Results  ?Component Value Date  ? CREATININE 0.83 02/05/2021  ? BUN 22 02/05/2021  ? GFR 85.05 02/05/2021  ? NA 139 02/05/2021  ? K 4.7 02/05/2021  ? CALCIUM 9.0 02/05/2021  ? CO2 27 02/05/2021  ? ? ?Lab Results  ?Component Value Date/Time  ? HGBA1C 6.0 02/05/2021 11:19 AM  ? HGBA1C 6.2 07/13/2020 04:23 PM  ? HGBA1C 5.9 04/08/2017 12:00 AM  ? GFR 85.05 02/05/2021 11:19 AM  ? GFR 76.17 07/13/2020 04:23 PM  ?  ?Last diabetic Eye exam: No results found for: HMDIABEYEEXA  ?Last diabetic Foot exam: No results found for: HMDIABFOOTEX  ? ?Lab Results  ?Component Value Date  ? CHOL 136 07/13/2020  ? HDL 44.30 07/13/2020  ? Leach 76 07/13/2020  ? LDLDIRECT 56.0 02/05/2021  ? TRIG 79.0 07/13/2020  ? CHOLHDL 3 07/13/2020  ? ? ? ?  Latest Ref Rng & Units 02/05/2021  ? 11:19 AM 07/13/2020  ?  4:23 PM 03/08/2020  ? 10:55 AM  ?Hepatic Function  ?Total Protein 6.0 - 8.3 g/dL 6.7   6.6  6.5    ?Albumin 3.5 - 5.2 g/dL 4.2   4.3   3.9    ?AST 0 - 37 U/L 35   43   33    ?ALT 0 - 53 U/L 35   45   51    ?Alk Phosphatase 39 - 117 U/L 68   76   71    ?Total Bilirubin 0.2 - 1.2 mg/dL 1.0   1.0   0.7    ? ?Lab Results  ?Component Value Date/Time  ? TSH 0.30 (L) 03/16/2021 09:19 AM  ? TSH 0.02 (L) 02/05/2021 11:19 AM  ? FREET4 0.61 03/16/2021 09:19 AM  ? FREET4 1.07 02/05/2021 11:19 AM  ? ? ? ?  Latest Ref Rng & Units 02/05/2021  ? 11:19 AM 07/13/2020  ?  4:23 PM 12/28/2019  ?  4:31 PM  ?CBC  ?WBC 4.0 - 10.5 K/uL 6.1   7.0   5.9    ?Hemoglobin 13.0 -  17.0 g/dL 14.3   14.0   14.3    ?Hematocrit 39.0 - 52.0 % 44.3   41.7   43.1    ?Platelets 150.0 - 400.0 K/uL 249.0   231.0   268    ? ? ?No results found for: VD25OH ? ?Clinical ASCVD: No  ?The 10-year ASCVD risk score (Arnett DK, et al., 2019) is: 48% ?  Values used to calculate the score: ?    Age: 57 years ?    Sex: Male ?    Is Non-Hispanic African American: No ?    Diabetic: Yes ?    Tobacco smoker: No ?    Systolic Blood Pressure: 269 mmHg ?    Is BP treated: Yes ?    HDL Cholesterol: 44.3 mg/dL ?    Total Cholesterol: 136 mg/dL   ? ? ?  03/09/2021  ?  1:08 PM 07/13/2020  ?  3:55 PM 03/03/2020  ?  1:08 PM  ?Depression screen PHQ 2/9  ?Decreased Interest 0 0 0  ?Down, Depressed, Hopeless 0 0 0  ?PHQ - 2 Score 0 0 0  ? ?Social History  ? ?Tobacco Use  ?Smoking Status Never  ?Smokeless Tobacco Never  ? ?BP Readings from Last 3 Encounters:  ?02/09/21 130/70  ?02/05/21 120/60  ?12/18/20 130/72  ? ?Pulse Readings from Last 3 Encounters:  ?02/09/21 60  ?02/05/21 66  ?12/18/20 (!) 58  ? ?Wt Readings from Last 3 Encounters:  ?02/09/21 172 lb 9.6 oz (78.3 kg)  ?02/05/21 169 lb 12.8 oz (77 kg)  ?12/18/20 174 lb 6.4 oz (79.1 kg)  ? ?Assessment/Interventions: Review of patient past medical history, allergies, medications, health status, including review of consultants reports, laboratory and other test data, was performed as part of comprehensive evaluation and provision of chronic care management services.  ? ?SDOH:  (Social Determinants of Health) assessments and interventions performed: ? ?CCM Care Plan ? ?No Known Allergies ? ?Medications Reviewed Today   ? ? Reviewed by Edythe Clarity, RPH (Pharmacist) on 05/28/21 at 1526  Med List Status: <None>  ? ?Medication Order Taking? Sig Documenting Provider Last Dose Status Informant  ?atorvastatin (LIPITOR) 40 MG tablet [id number] Yes Take 1 tablet (40 mg total) by mouth daily. [name], MD Taking Active   ?Black Pepper-Turmeric (TURMERIC CURCUMIN) 05-998 MG CAPS  [id number] Yes Take 1 capsule by mouth daily. [provider] Taking Active Self  ?Calcium Citrate-Vitamin D (CITRACAL + D PO) [id number] Yes Take 25 mcg by mouth daily. [provider] Taking Active   ?CHONDROITIN SULFATE PO [id number] Yes Take 200 mg by mouth daily. [provider] Taking Active   ?diclofenac Sodium (VOLTAREN) 1 % GEL [id number] Yes APPLY 2 GRAMS TO AFFECTED AREAS EVERY NIGHT AT BEDTIME [name], MD Taking Active   ?escitalopram (LEXAPRO) 10 MG tablet [id number] Yes TAKE ONE TABLET BY MOUTH DAILY [name], MD Taking Active   ?fluticasone (FLONASE) 50 MCG/ACT nasal spray [id number] Yes Place 1 spray into both nostrils daily. Jeanie Sewer, NP Taking Active   ?Glucosamine HCl 1500 MG TABS [id number] Yes Take 1,500 mg by mouth daily. [provider] Taking Active   ?Hyaluronic Acid-Vitamin C (HYALURONIC ACID PO) [id number] Yes Take 3.3 mg by mouth daily. [provider] Taking Active   ?hydrochlorothiazide (MICROZIDE) 12.5 MG capsule [id number] Yes Take 1 capsule (12.5 mg total) by mouth daily. [name], MD Taking Active   ?latanoprost (XALATAN) 0.005 % ophthalmic solution [id number] Yes Place 1 drop into both eyes at bedtime. [provider] Taking Active   ?MAGNESIUM PO [id number] Yes Take 72 mg by mouth. [provider] Taking Active   ?metFORMIN (GLUCOPHAGE) 500 MG tablet [id number] Yes TAKE 1/2 TABLET BY MOUTH DAILY WITH BREAKFAST [name], MD Taking Active   ?methimazole (TAPAZOLE) 5 MG tablet [id number] Yes Take 1.5 tablets (7.5 mg total) by mouth daily. [name], [name], MD Taking Active   ?Omega-3 Fatty Acids (OMEGA-3 FISH OIL PO) [id number] Yes Take by mouth. [provider] Taking Active   ?         ?Med Note Willette Brace   Fri Mar 03, 2020  1:05 PM) 667m epa, dha   ?OVER THE COUNTER MEDICATION [id number] Magtein [provider] Taking Active   ?Probiotic Product  (PROBIOTIC ADVANCED PO) [id number] Take 2 tablets by mouth daily. [provider] Taking Active   ?valsartan (DIOVAN) 320 MG tablet [id number] Take 1 tablet (320 mg total) by mouth daily

## 2021-05-28 ENCOUNTER — Ambulatory Visit (INDEPENDENT_AMBULATORY_CARE_PROVIDER_SITE_OTHER): Payer: PPO | Admitting: Pharmacist

## 2021-05-28 DIAGNOSIS — I10 Essential (primary) hypertension: Secondary | ICD-10-CM

## 2021-05-28 DIAGNOSIS — E0590 Thyrotoxicosis, unspecified without thyrotoxic crisis or storm: Secondary | ICD-10-CM

## 2021-05-28 NOTE — Patient Instructions (Signed)
Visit Information ? ? Goals Addressed   ? ?  ?  ?  ?  ? This Visit's Progress  ?  Track and Manage My Blood Pressure-Hypertension   On track  ?  Timeframe:  Long-Range Goal ?Priority:  High ?Start Date:  02/28/2020            ?Expected End Date: 08/27/2020 ? ?Follow Up Date 08/27/2020  ?  ?- check blood pressure daily ?- write blood pressure results in a log or diary  ?  ?Why is this important?   ?You won't feel high blood pressure, but it can still hurt your blood vessels.  ?High blood pressure can cause heart or kidney problems. It can also cause a stroke.  ?Making lifestyle changes like losing a little weight or eating less salt will help.  ?Checking your blood pressure at home and at different times of the day can help to control blood pressure.  ?If the doctor prescribes medicine remember to take it the way the doctor ordered.  ?Call the office if you cannot afford the medicine or if there are questions about it.   ?  ?Notes:  ?  ? ?  ? ?Patient Care Plan: [place]  ?  ? ?Problem Identified: hypertension, hyperlipidemia, hypothyroidism   ?Priority: High  ?Note:   ?Medication Assistance: None required.  Patient affirms current coverage meets needs. would like cost review through [facility]. ? ?Patient's preferred pharmacy is: ? ?[name] [address], [place] ?[place] ?[place] [address] ?Phone: [phone] Fax: [phone] ? ?[facility] [place] [place] [address] [place] [address] ?[address] ?[place] [address] ?Phone: [phone] Fax: [phone] ? ?[facility] [address] ?[address] ?[place] [address] ?Phone: [phone] Fax: [phone] ? ?We discussed: Benefits of medication synchronization, packaging and delivery as well as enhanced pharmacist oversight with [facility]. ?Patient decided to: review cost of Rx and OTC medications next month. ? ?Follow Up:   Patient agrees to Care Plan and Follow-up. ? ?Plan: Telephone follow up appointment with care management team member scheduled for:  6 months and The care management team will reach out to the patient again over the next 60 days. ? ?CPA: 1 month cost review of [name] for patient and patient's wife, [name]. Also let them know costs of OTC through [facility]. ? ?Current Barriers:  ?no specific barriers. will review cost through [facility]  ? ?Pharmacist Clinical Goal(s):  ?Over the next 180 days, patient will verbalize ability to afford treatment regimen ?contact provider office for questions/concerns as evidenced notation of same in electronic health record through collaboration with PharmD and provider.  ? ?Interventions: ?1:1 collaboration with [name], MD regarding development and update of comprehensive plan of care as evidenced by provider attestation and co-signature ?Inter-disciplinary care team collaboration (see longitudinal plan of care) ?Comprehensive medication review performed; medication list updated in electronic medical record ? ?Hypertension (BP goal <140/90) ?-controlled ?-Current treatment: ?Valsartan 320 mg (02/02/2020) ?Hydrochlorothiazide 12.5 mg ?-Medications previously tried: losartan 100 mg, amlodipine  ?-Current home readings: 135-139/60s ?-Current dietary habits: Cheerios, fruit such as strawberries blue berries black berries. Lunch - beef stew, salmon. Evening - fat free yogurt, ice cream, nuts. Drinks - 3 cups of coffee per day, several cups of water  ?-Current exercise habits: occasional walking, pilates session once a week ?-Denies hypotensive/hypertensive symptoms ?-Educated  on BP goals and benefits of medications for prevention of heart attack, stroke and kidney damage; ?Daily salt intake goal < 2300 mg; ?Exercise goal of 150 minutes per week; ?Importance of home blood pressure monitoring; ?-Counseled to monitor BP at home, document, and provide log at  future appointments ?-Counseled on diet and exercise extensively ?Recommended to continue current medication ?Counseled on medications used to treat hypertension ? ?Hyperlipidemia: (LDL goal < 70) ?-uncontrolled ?-Current treatment: ?Atorvastatin 40 mg once daily ?-Medications previously tried: n/a  ?-Current dietary patterns: see hypertension ?-Current exercise habits: see hypertension ?-Educated on Cholesterol goals;  ?Importance of limiting foods high in cholesterol; ?Exercise goal of 150 minutes per week; ?-Counseled on diet and exercise extensively ?Recommended to continue current medication ? ?Hypothyroidism (Goal: Normalization of TSH/free thyroid hormone) ?-uncontrolled ?-Current treatment  ?No medications at this time; levothyroxine on hold. Scheduled for f/u with Dr. Yong Channel.  ?-Medications previously tried: n/a  ?-Counseled on proper use of levothyroxine.  ? ?Patient Goals/Self-Care Activities ?Over the next 180 days, patient will:  ?- take medications as prescribed ?check blood pressure  , document, and provide at future appointments ? ?Follow Up Plan: Telephone follow up appointment with care management team member scheduled for: ?The care management team will reach out to the patient again over the next 60 days.  ? ?Current Barriers:  ?no specific barriers. will review costs of Rx and OTC through [facility] ? ?Patient Goals/Self-Care Activities ?Over the next 180 days, patient will:  ?- take medications as prescribed ? ?Follow Up Plan: The care management team will reach out to the patient again over the next 60 days.  ?Pharmacist f/u telephone visit next 6 months ?  ? ?Long-Range Goal: Disease management   ?Start Date: 09/19/2020  ?Expected End Date: 03/20/2021  ?Recent Progress: On track  ?Priority: High  ?Note:   ?Current Barriers:  ?TSH level, Grave's disease  ? ?Pharmacist Clinical Goal(s):  ?Over the next 180 days, patient will verbalize ability to afford treatment regimen ?contact provider office for  questions/concerns as evidenced notation of same in electronic health record through collaboration with PharmD and provider.  ? ?Interventions: ?1:1 collaboration with [name], MD regarding development and update of comprehensive plan of care as evidenced by provider attestation and co-signature ?Inter-disciplinary care team collaboration (see longitudinal plan of care) ?Comprehensive medication review performed; medication list updated in electronic medical record ? ?Hypertension (BP goal <140/90) ?-controlled ?-Current treatment: ?Valsartan 320 mg (02/02/2020) Appropriate, Effective, Safe, Accessible ?Hydrochlorothiazide 12.5 mg Appropriate, Effective, Safe, Accessible ?-Medications previously tried: losartan 100 mg, amlodipine  ?-Current home readings: 135-139/60s ?-Current dietary habits: Cheerios, fruit such as strawberries blue berries black berries. Lunch - beef stew, salmon. Evening - fat free yogurt, ice cream, nuts. Drinks - 3 cups of coffee per day, several cups of water  ?-Current exercise habits: occasional walking, pilates session once a week ?-Denies hypotensive/hypertensive symptoms ?-Educated on BP goals and benefits of medications for prevention of heart attack, stroke and kidney damage; ?Daily salt intake goal < 2300 mg; ?Exercise goal of 150 minutes per week; ?Importance of home blood pressure monitoring; ?-Counseled to monitor BP at home, document, and provide log at future appointments ?-Counseled on diet and exercise extensively ?Recommended to continue current medication ?Counseled on medications used to treat hypertension ? ?Update 09/19/20 ?BP remains controlled on current meds. ?He denies any dizziness/HA's ?Continue to monitor and exercise as current. ? ?No changes to meds at this time ? ?Update 05/28/21 ?Recent BP readings  ?107-127/50-60s. ?Denies  any dizziness or HA. ?BP remains stable - he is still exercising at the [facility] a few times per wek. ?Continue meds as  previous ? ?Hyperlipidemia: (LDL goal < 70) ?-uncontrolled ?-Current treatment: ?Atorvastatin 40 mg once daily ?-Medications previously tried: n/a  ?-Current dietary patterns: see hypertension ?-Current exercise habi

## 2021-06-05 ENCOUNTER — Other Ambulatory Visit: Payer: Self-pay

## 2021-06-05 ENCOUNTER — Encounter: Payer: Self-pay | Admitting: Family Medicine

## 2021-06-05 DIAGNOSIS — Z1211 Encounter for screening for malignant neoplasm of colon: Secondary | ICD-10-CM

## 2021-06-12 ENCOUNTER — Encounter: Payer: Self-pay | Admitting: Gastroenterology

## 2021-06-12 ENCOUNTER — Telehealth: Payer: Self-pay | Admitting: Gastroenterology

## 2021-06-12 NOTE — Telephone Encounter (Signed)
Good morning Dr. Loletha Carrow,  This patient brought his records by for you to review for a transfer of care.  He is now living in [place].  He is coming from [facility], [place], and brought his last two colonoscopy reports (2015 and 2018).  I am sending these records to you for your review.  Please let me know if you approve the transfer.  Thank you.

## 2021-06-12 NOTE — Telephone Encounter (Signed)
Please make a clinic appointment with me for this patient.  I will review his records at that time so please have him bring those records to the visit as well in case they have not yet been scanned into his chart by then.  HD

## 2021-06-15 ENCOUNTER — Ambulatory Visit: Payer: PPO | Admitting: Internal Medicine

## 2021-06-20 DIAGNOSIS — E039 Hypothyroidism, unspecified: Secondary | ICD-10-CM

## 2021-06-20 DIAGNOSIS — E785 Hyperlipidemia, unspecified: Secondary | ICD-10-CM | POA: Diagnosis not present

## 2021-06-20 DIAGNOSIS — I10 Essential (primary) hypertension: Secondary | ICD-10-CM

## 2021-06-25 ENCOUNTER — Encounter: Payer: Self-pay | Admitting: Internal Medicine

## 2021-06-25 ENCOUNTER — Ambulatory Visit: Payer: PPO | Admitting: Internal Medicine

## 2021-06-25 VITALS — BP 120/74 | HR 61 | Ht 69.0 in | Wt 178.0 lb

## 2021-06-25 DIAGNOSIS — E0590 Thyrotoxicosis, unspecified without thyrotoxic crisis or storm: Secondary | ICD-10-CM

## 2021-06-25 DIAGNOSIS — E0500 Thyrotoxicosis with diffuse goiter without thyrotoxic crisis or storm: Secondary | ICD-10-CM | POA: Diagnosis not present

## 2021-06-25 LAB — T4, FREE: Free T4: 0.59 ng/dL — ABNORMAL LOW (ref 0.60–1.60)

## 2021-06-25 LAB — TSH: TSH: 10.06 u[IU]/mL — ABNORMAL HIGH (ref 0.35–5.50)

## 2021-06-25 MED ORDER — METHIMAZOLE 5 MG PO TABS: 5.0000 mg | ORAL_TABLET | Freq: Every day | ORAL | 2 refills | Status: DC

## 2021-06-25 NOTE — Progress Notes (Signed)
Name: Darius Long.  MRN/ DOB: [id number], August 20, 1945    Age/ Sex: 76 y.o., male     PCP: [name], MD   Reason for Endocrinology Evaluation: Hyperthyroidism     Initial Endocrinology Clinic Visit: 03/15/2020    PATIENT IDENTIFIER: Mr. Brayden Brodhead. is a 76 y.o., male with a past medical history of HTN, dyslipidemia and OCD. He has followed with [facility] since 03/15/2020 for consultative assistance with management of his Hyperthyroidism      HISTORICAL SUMMARY:  They moved from [facility], [place] in May 2019  He was diagnosed with hypothyroidism ~ 15 yrs ago and was on LT-4  Until 02/03/2019     Over the summer/fall  of 2021 he has noted weight loss as well as decrease muscle mass. His LT-4 replacement has been gradually reduced until fully discontinued  On 02/03/2019   No amiodarone use   Methimazole started 02/2020   No FH of thyroid disease     SUBJECTIVE:     Today (06/25/2021):  Mr. Furio is here for Hyperthyroidism.     Weight continues to fluctuate  Denies palpitations  Has dry eyes , uses sustain drops and atanoprost through Dr. Katy Fitch  No local neck symptoms  Denies hand tremors     Methimazole 5 mg 1.5 tab daily   HISTORY:  Past Medical History:  Past Medical History:  Diagnosis Date   Arthritis    Attention deficit disorder (ADD) in adult    Adderall '30mg'$  tablet- half tablet twice a day.    Chicken pox    Colon polyp    dad died of colon cancer- q5 year colonoscopy. 2018   Encounter for blood transfusion    related to overtreatment with PCN. became very ill.    Heart murmur    Sees cardiology October each year due to murmur after fall- get records. Seen for heart murmur heard 2010. We discused may not need to see cardiology. Every other year stres test   Hyperglycemia    a1c 5.9 in 03/2017   Hyperlipidemia    atorvastatin '40mg'$    Hypertension    losartan 50, hctz 12.'5mg'$    Hypothyroidism     levothyroxine 100 mcg 6 days a week as of mid 2019.    Seasonal allergies    prn antihistamine   Past Surgical History:  Past Surgical History:  Procedure Laterality Date   Arthroscopic surgery Left 1986-1996   Damaged cartilage Left knee   BACK SURGERY Bilateral 2008   Injury due to a fall. 2 fractured vertebrae, 3 fractured ribs fracture L scapulla, mild concussion   CHOLECYSTECTOMY  2014   Relocation surgery Right    For right elbow ulna nerve    REPAIR of torn retina Right 2014   TONSILECTOMY, ADENOIDECTOMY, BILATERAL MYRINGOTOMY AND TUBES  1958   vena Laser   2009   Remaoval of varicous veins   Social History:  reports that he has never smoked. He has never used smokeless tobacco. He reports that he does not currently use alcohol. He reports that he does not use drugs. Family History:  Family History  Problem Relation Age of Onset   Arthritis Mother        passed at age 42   Miscarriages / [place] Mother    Hearing loss Father    Colon cancer Father        died at 45   Other Daughter  diabetes educator   Heart disease Paternal 51 / Stillbirths Daughter    Diabetes Mellitus I Daughter        in [place] : Allergies as of 06/25/2021   No Known Allergies      Medication List        Accurate as of June 25, 2021  8:15 AM. If you have any questions, ask your nurse or doctor.          atorvastatin 40 MG tablet Commonly known as: LIPITOR Take 1 tablet (40 mg total) by mouth daily.   CHONDROITIN SULFATE PO Take 200 mg by mouth daily.   CITRACAL + D PO Take 25 mcg by mouth daily.   diclofenac Sodium 1 % Gel Commonly known as: VOLTAREN APPLY 2 GRAMS TO AFFECTED AREAS EVERY NIGHT AT BEDTIME   escitalopram 10 MG tablet Commonly known as: LEXAPRO TAKE ONE TABLET BY MOUTH DAILY   fluticasone 50 MCG/ACT nasal spray Commonly known as: FLONASE Place 1 spray into both nostrils daily.   Glucosamine HCl  1500 MG Tabs Take 1,500 mg by mouth daily.   HYALURONIC ACID PO Take 3.3 mg by mouth daily.   hydrochlorothiazide 12.5 MG capsule Commonly known as: MICROZIDE Take 1 capsule (12.5 mg total) by mouth daily.   latanoprost 0.005 % ophthalmic solution Commonly known as: XALATAN Place 1 drop into both eyes at bedtime.   MAGNESIUM PO Take 72 mg by mouth.   metFORMIN 500 MG tablet Commonly known as: GLUCOPHAGE TAKE 1/2 TABLET BY MOUTH DAILY WITH BREAKFAST   methimazole 5 MG tablet Commonly known as: TAPAZOLE Take 1.5 tablets (7.5 mg total) by mouth daily.   OMEGA-3 FISH OIL PO Take by mouth.   OVER THE COUNTER MEDICATION Magtein   PROBIOTIC ADVANCED PO Take 2 tablets by mouth daily.   Turmeric Curcumin 05-998 MG Caps Take 1 capsule by mouth daily.   valsartan 320 MG tablet Commonly known as: DIOVAN Take 1 tablet (320 mg total) by mouth daily.   vitamin E 180 MG (400 UNITS) capsule Take 800 Units by mouth daily.          OBJECTIVE:   PHYSICAL EXAM: VS: BP 120/74 (BP Location: Left Arm, Patient Position: Sitting, Cuff Size: Small)   Pulse 61   Ht '5\' 9"'$  (1.753 m)   Wt 178 lb (80.7 kg)   SpO2 96%   BMI 26.29 kg/m    EXAM: General: Pt appears well and is in NAD  Eyes: External eye exam normal without stare, lid lag or exophthalmos.  EOM intact  Neck: General: Supple without adenopathy. Thyroid: Left thyroid nodule appreciated  Lungs: Clear with good BS bilat with no rales, rhonchi, or wheezes  Heart: Auscultation: RRR.  Abdomen: Normoactive bowel sounds, soft, nontender, without masses or organomegaly palpable  Extremities:  BL LE: No pretibial edema normal ROM and strength.  Mental Status: Judgment, insight: Intact Orientation: Oriented to time, place, and person Mood and affect: No depression, anxiety, or agitation     DATA REVIEWED:  Latest Reference Range & Units 06/25/21 08:37  TSH 0.35 - 5.50 uIU/mL 10.06 (H)  T4,Free(Direct) 0.60 - 1.60 ng/dL  0.59 (L)     Latest Reference Range & Units 02/05/21 11:19  Sodium 135 - 145 mEq/L 139  Potassium 3.5 - 5.1 mEq/L 4.7  Chloride 96 - 112 mEq/L 106  CO2 19 - 32 mEq/L 27  Glucose 70 - 99 mg/dL 63 (L)  BUN  6 - 23 mg/dL 22  Creatinine 0.40 - 1.50 mg/dL 0.83  Calcium 8.4 - 10.5 mg/dL 9.0  Alkaline Phosphatase 39 - 117 U/L 68  Albumin 3.5 - 5.2 g/dL 4.2  AST 0 - 37 U/L 35  ALT 0 - 53 U/L 35  Total Protein 6.0 - 8.3 g/dL 6.7  Total Bilirubin 0.2 - 1.2 mg/dL 1.0  GFR >60.00 mL/min 85.05    Latest Reference Range & Units 02/05/21 11:19  WBC 4.0 - 10.5 K/uL 6.1  RBC 4.22 - 5.81 Mil/uL 4.87  Hemoglobin 13.0 - 17.0 g/dL 14.3  HCT 39.0 - 52.0 % 44.3  MCV 78.0 - 100.0 fl 91.0  MCHC 30.0 - 36.0 g/dL 32.3  RDW 11.5 - 15.5 % 15.0  Platelets 150.0 - 400.0 K/uL 249.0  Neutrophils 43.0 - 77.0 % 57.1  Lymphocytes 12.0 - 46.0 % 27.9  Monocytes Relative 3.0 - 12.0 % 12.0  Eosinophil 0.0 - 5.0 % 2.5  Basophil 0.0 - 3.0 % 0.5  NEUT# 1.4 - 7.7 K/uL 3.5  Lymphocyte # 0.7 - 4.0 K/uL 1.7  Monocyte # 0.1 - 1.0 K/uL 0.7  Eosinophils Absolute 0.0 - 0.7 K/uL 0.2  Basophils Absolute 0.0 - 0.1 K/uL 0.0      Results for ALESSANDER, SIKORSKI (MRN [id number]) as of 06/12/2020 09:57  Ref. Range 03/15/2020 12:16  TRAB Latest Ref Range: <=2.00 IU/L 18.65 (H)   Thyroid ULtrasound 04/04/2020    Parenchymal Echotexture: Moderately heterogenous   Isthmus: 0.4 cm   Right lobe: 4.8 x 2.3 x 2.0 cm   Left lobe: 4.1 x 2.2 x 1.7 cm   _________________________________________________________   Estimated total number of nodules >/= 1 cm: 0   Number of spongiform nodules >/=  2 cm not described below (TR1): 0   Number of mixed cystic and solid nodules >/= 1.5 cm not described below (TR2): 0   _________________________________________________________   Nodule # 1:   Location: Left; Superior   Maximum size: 0.7 cm; Other 2 dimensions: 0.6 x 0.5 cm   Composition: solid/almost completely solid  (2)   Echogenicity: isoechoic (1)   Shape: not taller-than-wide (0)   Margins: smooth (0)   Echogenic foci: none (0)   ACR TI-RADS total points: 3.   ACR TI-RADS risk category: TR3 (3 points).   ACR TI-RADS recommendations:   Given size (<1.4 cm) and appearance, this nodule does NOT meet TI-RADS criteria for biopsy or dedicated follow-up.   _________________________________________________________   IMPRESSION: 1. Moderately heterogeneous, normal-sized thyroid gland. 2. Single solid left upper thyroid nodule (labeled 1, 0.7 cm) which does not meet criteria (TI-RADS category 3) for dedicated ultrasound follow-up or tissue sampling.   The above is in keeping with the ACR TI-RADS recommendations - J Am Coll Radiol 2017;14:587-595.   Ruthann Cancer, MD   [facility]   [facility]     ASSESSMENT / PLAN / RECOMMENDATIONS:   Hyperthyroidism:   -Patient with Graves' disease but he also has a left thyroid nodule which could also be contributing to hyperthyroidism -Patient is clinically euthyroid -No local neck symptoms -Historically he has been noted with sensitivity to methimazole changes.  His TSH is elevated and will reduce methimazole with repeat TFTs in 3 months    Medications  Decrease methimazole 5 mg, 1  tablet daily    2. Graves' Disease :  -No extrathyroidal manifestations of Graves' disease.  He is up-to-date on eye exams    3. Left Thyroid Nodule:  -  No local neck symptoms -This is too small to follow-up  Follow-up in 6 months Labs in 12 weeks   Signed electronically by: Mack Guise, MD  [facility]  [address] [place], [place] [address] Phone: [phone] FAX: [phone]      CC: [name], [place] [address] Phone: [phone]  Fax: [phone]   Return to Endocrinology clinic as  below: Future Appointments  Date Time Provider [place]  07/10/2021 10:00 AM Doran Stabler, MD [facility]  08/17/2021 11:00 AM [name], MD [facility]  09/26/2021  9:15 AM Hayden Pedro, MD [facility] None  03/18/2022  1:00 PM [facility] HEALTH COACH [facility]  06/03/2022  2:00 PM [facility] CCM PHARMACIST [facility]

## 2021-06-26 LAB — T3: T3, Total: 98 ng/dL (ref 76–181)

## 2021-07-04 DIAGNOSIS — H532 Diplopia: Secondary | ICD-10-CM | POA: Diagnosis not present

## 2021-07-04 DIAGNOSIS — H43813 Vitreous degeneration, bilateral: Secondary | ICD-10-CM | POA: Diagnosis not present

## 2021-07-04 DIAGNOSIS — H40013 Open angle with borderline findings, low risk, bilateral: Secondary | ICD-10-CM | POA: Diagnosis not present

## 2021-07-04 DIAGNOSIS — H2513 Age-related nuclear cataract, bilateral: Secondary | ICD-10-CM | POA: Diagnosis not present

## 2021-07-04 DIAGNOSIS — E119 Type 2 diabetes mellitus without complications: Secondary | ICD-10-CM | POA: Diagnosis not present

## 2021-07-10 ENCOUNTER — Encounter: Payer: Self-pay | Admitting: Family Medicine

## 2021-07-10 ENCOUNTER — Encounter: Payer: Self-pay | Admitting: Gastroenterology

## 2021-07-10 ENCOUNTER — Ambulatory Visit: Payer: PPO | Admitting: Gastroenterology

## 2021-07-10 ENCOUNTER — Other Ambulatory Visit: Payer: Self-pay

## 2021-07-10 VITALS — BP 122/68 | HR 77 | Ht 68.5 in | Wt 178.6 lb

## 2021-07-10 DIAGNOSIS — Z800 Family history of malignant neoplasm of digestive organs: Secondary | ICD-10-CM

## 2021-07-10 DIAGNOSIS — K5730 Diverticulosis of large intestine without perforation or abscess without bleeding: Secondary | ICD-10-CM

## 2021-07-10 MED ORDER — ATORVASTATIN CALCIUM 40 MG PO TABS: 40.0000 mg | ORAL_TABLET | Freq: Every day | ORAL | 3 refills | Status: DC

## 2021-07-10 NOTE — Patient Instructions (Signed)
If you are age 77 or older, your body mass index should be between 23-30. Your Body mass index is 26.76 kg/m. If this is out of the aforementioned range listed, please consider follow up with your Primary Care Provider.  If you are age 56 or younger, your body mass index should be between 19-25. Your Body mass index is 26.76 kg/m. If this is out of the aformentioned range listed, please consider follow up with your Primary Care Provider.   ________________________________________________________  The [facility] providers would like to encourage you to use [facility] to communicate with providers for non-urgent requests or questions.  Due to long hold times on the telephone, sending your provider a message by [facility] may be a faster and more efficient way to get a response.  Please allow 48 business hours for a response.  Please remember that this is for non-urgent requests.  _______________________________________________________  Darius Long will be due for a recall colonoscopy in October 2023. We will send you a reminder in the mail when it gets closer to that time.   It was a pleasure to see you today!  Thank you for trusting me with your gastrointestinal care!

## 2021-07-10 NOTE — Progress Notes (Signed)
[facility] Consult Note:  History: Darius Long. 07/10/2021  Referring provider: [name], MD  Reason for consult/chief complaint: Colonoscopy (Patient states he is here for a consult on a colonoscopy. )   Subjective  HPI: This is a 76 year old man establishing care for family history of colon cancer (father in his late 1s), [place]. All of her brought report from his last colonoscopy in [place] on 11/12/2016. Complete exam to the cecum, prep described as adequate Reportedly normal exam. 2015 colonoscopy without polyps - diverticulosis noted. He recalls there was a polyp on the 2012 colonoscopy, no procedure or pathology report available today.  He attempted to get it was unable to do so.  Gene was here with his wife [name] (a patient of mine) to discuss colonoscopy due to family history. His bowel habits are regular without rectal bleeding, he denies any chronic upper digestive symptoms.  ROS:  Review of Systems  Constitutional:  Negative for appetite change and unexpected weight change.  HENT:  Negative for mouth sores and voice change.   Eyes:  Negative for pain and redness.  Respiratory:  Negative for cough and shortness of breath.   Cardiovascular:  Negative for chest pain and palpitations.  Genitourinary:  Negative for dysuria and hematuria.  Musculoskeletal:  Negative for arthralgias and myalgias.  Skin:  Negative for pallor and rash.  Neurological:  Negative for weakness and headaches.  Hematological:  Negative for adenopathy.   He was experiencing weight loss that turned out to be from Graves' disease that is currently under treatment.   Past Medical History: Past Medical History:  Diagnosis Date   Anxiety    Arthritis    Attention deficit disorder (ADD) in adult    Adderall '30mg'$  tablet- half tablet twice a day.    Chicken pox    Colon polyp    dad died of colon  cancer- q5 year colonoscopy. 2018   Diverticulosis    Encounter for blood transfusion    related to overtreatment with PCN. became very ill.    Gall stones    Heart murmur    Sees cardiology October each year due to murmur after fall- get records. Seen for heart murmur heard 2010. We discused may not need to see cardiology. Every other year stres test   Hyperglycemia    a1c 5.9 in 03/2017   Hyperlipidemia    atorvastatin '40mg'$    Hypertension    losartan 50, hctz 12.'5mg'$    Hypothyroidism    levothyroxine 100 mcg 6 days a week as of mid 2019.    Seasonal allergies    prn antihistamine     Past Surgical History: Past Surgical History:  Procedure Laterality Date   Arthroscopic surgery Left 1986-1996   Damaged cartilage Left knee   BACK SURGERY Bilateral 2008   Injury due to a fall. 2 fractured vertebrae, 3 fractured ribs fracture L scapulla, mild concussion   CHOLECYSTECTOMY  2014   Relocation surgery Right    For right elbow ulna nerve    REPAIR of torn retina Right 2014   TONSILECTOMY, ADENOIDECTOMY, BILATERAL MYRINGOTOMY AND TUBES  1958   vena Laser   2009   Remaoval of varicous veins     Family History: Family History  Problem Relation Age of Onset   Arthritis Mother        passed at age 55   Miscarriages / [place] Mother    Hearing loss Father  Colon cancer Father        died at 49   Heart disease Paternal Grandmother    Other Daughter        diabetes educator   Miscarriages / [place] Daughter    Diabetes Mellitus I Daughter        in [place]   Esophageal cancer Neg Hx    Stomach cancer Neg Hx    Colon polyps Neg Hx     Social History: Social History   Socioeconomic History   Marital status: Married    Spouse name: Not on file   Number of children: 2   Years of education: Not on file   Highest education level: Not on file  Occupational History   Occupation: Retired   Tobacco Use   Smoking status: Never   Smokeless tobacco: Never  Vaping  Use   Vaping Use: Never used  Substance and Sexual Activity   Alcohol use: Not Currently   Drug use: Never   Sexual activity: Not Currently  Other Topics Concern   Not on file  Social History Narrative   Married. Twin daughters. 4 grandkids (2 each)   Moved from [place], [place]      Retired from Conservation officer, historic buildings      Hobbies: time with grandkids, enjoys travel   Social Determinants of Radio broadcast assistant Strain: [place]  (03/09/2021)   Overall Financial Resource Strain (CARDIA)    Difficulty of Paying Living Expenses: Not hard at all  Food Insecurity: No Food Insecurity (03/09/2021)   Hunger Vital Sign    Worried About Running Out of Food in the Last Year: Never true    [place] in the Last Year: Never true  Transportation Needs: No Transportation Needs (03/09/2021)   PRAPARE - Hydrologist (Medical): No    Lack of Transportation (Non-Medical): No  Physical Activity: Sufficiently Active (03/09/2021)   Exercise Vital Sign    Days of Exercise per Week: 2 days    Minutes of Exercise per Session: 90 min  Stress: No Stress Concern Present (03/09/2021)   [place]    Feeling of Stress : Not at all  Social Connections: Moderately Integrated (03/09/2021)   Social Connection and Isolation Panel [NHANES]    Frequency of Communication with Friends and Family: More than three times a week    Frequency of Social Gatherings with Friends and Family: More than three times a week    Attends Religious Services: More than 4 times per year    Active Member of [employer] or Organizations: No    Attends Archivist Meetings: Never    Marital Status: Married    Allergies: No Known Allergies  Outpatient Meds: Current Outpatient Medications  Medication Sig Dispense Refill   atorvastatin (LIPITOR) 40 MG tablet Take 1 tablet (40 mg total) by mouth daily. 90 tablet 3    Black Pepper-Turmeric (TURMERIC CURCUMIN) 05-998 MG CAPS Take 1 capsule by mouth daily.     Calcium Citrate-Vitamin D (CITRACAL + D PO) Take 25 mcg by mouth daily.     CHONDROITIN SULFATE PO Take 200 mg by mouth daily.     diclofenac Sodium (VOLTAREN) 1 % GEL APPLY 2 GRAMS TO AFFECTED AREAS EVERY NIGHT AT BEDTIME 100 g 2   escitalopram (LEXAPRO) 10 MG tablet TAKE ONE TABLET BY MOUTH DAILY 90 tablet 3   Glucosamine HCl 1500  MG TABS Take 1,500 mg by mouth daily.     Hyaluronic Acid-Vitamin C (HYALURONIC ACID PO) Take 3.3 mg by mouth daily.     hydrochlorothiazide (MICROZIDE) 12.5 MG capsule Take 1 capsule (12.5 mg total) by mouth daily. 90 capsule 3   latanoprost (XALATAN) 0.005 % ophthalmic solution Place 1 drop into both eyes at bedtime.     MAGNESIUM PO Take 72 mg by mouth.     metFORMIN (GLUCOPHAGE) 500 MG tablet TAKE 1/2 TABLET BY MOUTH DAILY WITH BREAKFAST 45 tablet 3   methimazole (TAPAZOLE) 5 MG tablet Take 1 tablet (5 mg total) by mouth daily. 90 tablet 2   Omega-3 Fatty Acids (OMEGA-3 FISH OIL PO) Take by mouth.     OVER THE COUNTER MEDICATION Magtein     Probiotic Product (PROBIOTIC ADVANCED PO) Take 2 tablets by mouth daily.     valsartan (DIOVAN) 320 MG tablet Take 1 tablet (320 mg total) by mouth daily. 90 tablet 1   vitamin E 400 UNIT capsule Take 800 Units by mouth daily.     fluticasone (FLONASE) 50 MCG/ACT nasal spray Place 1 spray into both nostrils daily. (Patient not taking: Reported on 07/10/2021) 16 g 1   No current facility-administered medications for this visit.      ___________________________________________________________________ Objective   Exam:  BP 122/68   Pulse 77   Ht 5' 8.5" (1.74 m)   Wt 178 lb 9.6 oz (81 kg)   SpO2 97%   BMI 26.76 kg/m  Wt Readings from Last 3 Encounters:  07/10/21 178 lb 9.6 oz (81 kg)  06/25/21 178 lb (80.7 kg)  02/09/21 172 lb 9.6 oz (78.3 kg)    General: Well-appearing Eyes: sclera anicteric, no redness ENT: oral  mucosa moist without lesions, no cervical or supraclavicular lymphadenopathy CV: Regular without appreciable murmur, no JVD, no peripheral edema Resp: clear to auscultation bilaterally, normal RR and effort noted GI: soft, no tenderness, with active bowel sounds. No guarding or palpable organomegaly noted. Skin; warm and dry, no rash or jaundice noted Neuro: awake, alert and oriented x 3. Normal gross motor function and fluent speech  Data  December 2019 echocardiogram done for heart murmur: Normal LVEF, no significant valvular abnormalities.     Latest Ref Rng & Units 02/05/2021   11:19 AM 07/13/2020    4:23 PM 12/28/2019    4:31 PM  CBC  WBC 4.0 - 10.5 K/uL 6.1  7.0  5.9   Hemoglobin 13.0 - 17.0 g/dL 14.3  14.0  14.3   Hematocrit 39.0 - 52.0 % 44.3  41.7  43.1   Platelets 150.0 - 400.0 K/uL 249.0  231.0  268       Latest Ref Rng & Units 02/05/2021   11:19 AM 07/13/2020    4:23 PM 03/08/2020   10:55 AM  CMP  Glucose 70 - 99 mg/dL 63  86  102   BUN 6 - 23 mg/dL '22  22  25   '$ Creatinine 0.40 - 1.50 mg/dL 0.83  0.97  0.70   Sodium 135 - 145 mEq/L 139  138  138   Potassium 3.5 - 5.1 mEq/L 4.7  4.1  4.2   Chloride 96 - 112 mEq/L 106  100  101   CO2 19 - 32 mEq/L 27  33  29   Calcium 8.4 - 10.5 mg/dL 9.0  9.2  9.2   Total Protein 6.0 - 8.3 g/dL 6.7  6.6  6.5   Total Bilirubin 0.2 -  1.2 mg/dL 1.0  1.0  0.7   Alkaline Phos 39 - 117 U/L 68  76  71   AST 0 - 37 U/L 35  43  33   ALT 0 - 53 U/L 35  45  51      Assessment: Encounter Diagnoses  Name Primary?   Family history of colon cancer in father Yes   Diverticulosis of colon without hemorrhage     Incidental diverticulosis on routine colonoscopies.  No history of diverticulitis. Family history colon cancer in father.  Fortunately, Gene has had a low risk polyp history.  Given his family history, age and overall good health, I recommend he have a screening colonoscopy.  He was agreeable after discussion of procedure and  risks.  The benefits and risks of the planned procedure were described in detail with the patient or (when appropriate) their health care proxy.  Risks were outlined as including, but not limited to, bleeding, infection, perforation, adverse medication reaction leading to cardiac or pulmonary decompensation, pancreatitis (if ERCP).  The limitation of incomplete mucosal visualization was also discussed.  No guarantees or warranties were given.  He would like to wait until October to be sure it is no sooner than the 5-year mark and therefore covered by Medicare.  We placed a recall and we will contact him at that time to directly schedule the procedure.   Thank you for the courtesy of this consult.  Please call me with any questions or concerns.  Nelida Meuse III  CC: Referring provider noted above

## 2021-07-11 ENCOUNTER — Other Ambulatory Visit: Payer: Self-pay | Admitting: Family Medicine

## 2021-08-07 ENCOUNTER — Encounter: Payer: PPO | Admitting: Family Medicine

## 2021-08-17 ENCOUNTER — Ambulatory Visit (INDEPENDENT_AMBULATORY_CARE_PROVIDER_SITE_OTHER): Payer: PPO | Admitting: Family Medicine

## 2021-08-17 ENCOUNTER — Encounter: Payer: Self-pay | Admitting: Family Medicine

## 2021-08-17 ENCOUNTER — Other Ambulatory Visit (INDEPENDENT_AMBULATORY_CARE_PROVIDER_SITE_OTHER): Payer: PPO

## 2021-08-17 VITALS — BP 136/68 | HR 63 | Temp 98.5°F | Ht 68.5 in | Wt 175.2 lb

## 2021-08-17 DIAGNOSIS — I10 Essential (primary) hypertension: Secondary | ICD-10-CM | POA: Diagnosis not present

## 2021-08-17 DIAGNOSIS — E0590 Thyrotoxicosis, unspecified without thyrotoxic crisis or storm: Secondary | ICD-10-CM

## 2021-08-17 DIAGNOSIS — G2581 Restless legs syndrome: Secondary | ICD-10-CM

## 2021-08-17 DIAGNOSIS — R739 Hyperglycemia, unspecified: Secondary | ICD-10-CM

## 2021-08-17 DIAGNOSIS — Z0000 Encounter for general adult medical examination without abnormal findings: Secondary | ICD-10-CM

## 2021-08-17 DIAGNOSIS — E785 Hyperlipidemia, unspecified: Secondary | ICD-10-CM

## 2021-08-17 DIAGNOSIS — R351 Nocturia: Secondary | ICD-10-CM | POA: Insufficient documentation

## 2021-08-17 LAB — COMPREHENSIVE METABOLIC PANEL
ALT: 33 U/L (ref 0–53)
AST: 35 U/L (ref 0–37)
Albumin: 4.3 g/dL (ref 3.5–5.2)
Alkaline Phosphatase: 71 U/L (ref 39–117)
BUN: 21 mg/dL (ref 6–23)
CO2: 33 mEq/L — ABNORMAL HIGH (ref 19–32)
Calcium: 9 mg/dL (ref 8.4–10.5)
Chloride: 101 mEq/L (ref 96–112)
Creatinine, Ser: 0.97 mg/dL (ref 0.40–1.50)
GFR: 75.58 mL/min (ref >60.00)
Glucose, Bld: 92 mg/dL (ref 70–99)
Potassium: 4.2 mEq/L (ref 3.5–5.1)
Sodium: 139 mEq/L (ref 135–145)
Total Bilirubin: 1.1 mg/dL (ref 0.2–1.2)
Total Protein: 6.8 g/dL (ref 6.0–8.3)

## 2021-08-17 LAB — LIPID PANEL
Cholesterol: 159 mg/dL (ref 0–200)
HDL: 44.1 mg/dL (ref >39.00)
LDL Cholesterol: 102 mg/dL — ABNORMAL HIGH (ref 0–99)
NonHDL: 114.84
Total CHOL/HDL Ratio: 4
Triglycerides: 64 mg/dL (ref 0.0–149.0)
VLDL: 12.8 mg/dL (ref 0.0–40.0)

## 2021-08-17 LAB — PSA: PSA: 0.94 ng/mL (ref 0.10–4.00)

## 2021-08-17 LAB — IBC + FERRITIN
Ferritin: 92.7 ng/mL (ref 22.0–322.0)
Iron: 112 ug/dL (ref 42–165)
Saturation Ratios: 32.5 % (ref 20.0–50.0)
TIBC: 344.4 ug/dL (ref 250.0–450.0)
Transferrin: 246 mg/dL (ref 212.0–360.0)

## 2021-08-17 LAB — CBC WITH DIFFERENTIAL/PLATELET
Basophils Absolute: 0 10*3/uL (ref 0.0–0.1)
Basophils Relative: 0.5 % (ref 0.0–3.0)
Eosinophils Absolute: 0.2 10*3/uL (ref 0.0–0.7)
Eosinophils Relative: 3.9 % (ref 0.0–5.0)
HCT: 42.7 % (ref 39.0–52.0)
Hemoglobin: 14.4 g/dL (ref 13.0–17.0)
Lymphocytes Relative: 34.6 % (ref 12.0–46.0)
Lymphs Abs: 1.6 10*3/uL (ref 0.7–4.0)
MCHC: 33.7 g/dL (ref 30.0–36.0)
MCV: 91.8 fl (ref 78.0–100.0)
Monocytes Absolute: 0.5 10*3/uL (ref 0.1–1.0)
Monocytes Relative: 10.4 % (ref 3.0–12.0)
Neutro Abs: 2.3 10*3/uL (ref 1.4–7.7)
Neutrophils Relative %: 50.6 % (ref 43.0–77.0)
Platelets: 226 10*3/uL (ref 150.0–400.0)
RBC: 4.65 Mil/uL (ref 4.22–5.81)
RDW: 14 % (ref 11.5–15.5)
WBC: 4.5 10*3/uL (ref 4.0–10.5)

## 2021-08-17 LAB — T4, FREE: Free T4: 0.57 ng/dL — ABNORMAL LOW (ref 0.60–1.60)

## 2021-08-17 LAB — TSH: TSH: 3.7 u[IU]/mL (ref 0.35–5.50)

## 2021-08-17 LAB — HEMOGLOBIN A1C: Hgb A1c MFr Bld: 6.1 % (ref 4.6–6.5)

## 2021-08-17 LAB — T3, FREE: T3, Free: 2.8 pg/mL (ref 2.3–4.2)

## 2021-08-17 NOTE — Patient Instructions (Addendum)
Please go to [facility] (updated 03/18/2019) - located [address] across the [address] from [place] - in the basement - Hours: 8:30-5:00 PM M-F (with lunch from 12:30- 1 PM). You do NOT need an appointment.   Consider covid and flu shot in October at pharmacy  Recommended follow up: Return in about 6 months (around 02/17/2022) for followup or sooner if needed.Schedule b4 you leave.

## 2021-08-17 NOTE — Progress Notes (Signed)
Phone: [phone]   Subjective:  Patient presents today for their annual physical. Chief complaint-noted.   See problem oriented charting- ROS- full  review of systems was completed and negative  except for: minor post nasal drip, itchy eyes, joint stiffness, restless legs- bothering sleep  The following were reviewed and entered/updated in epic: Past Medical History:  Diagnosis Date   Anxiety    Arthritis    Attention deficit disorder (ADD) in adult    Adderall 74m tablet- half tablet twice a day.    Chicken pox    Colon polyp    dad died of colon cancer- q5 year colonoscopy. 2018   Diverticulosis    Encounter for blood transfusion    related to overtreatment with PCN. became very ill.    Gall stones    Heart murmur    Sees cardiology October each year due to murmur after fall- get records. Seen for heart murmur heard 2010. We discused may not need to see cardiology. Every other year stres test   Hyperglycemia    a1c 5.9 in 03/2017   Hyperlipidemia    atorvastatin 422m  Hypertension    losartan 50, hctz 12.81m72m Hypothyroidism    levothyroxine 100 mcg 6 days a week as of mid 2019.    Seasonal allergies    prn antihistamine   Patient Active Problem List   Diagnosis Date Noted   Hyperthyroidism 03/16/2020    Priority: High   OCD (obsessive compulsive disorder) 01/01/2018    Priority: High   Attention deficit disorder (ADD) in adult     Priority: High   Heart murmur     Priority: High   Nocturia 08/17/2021    Priority: Medium    Arthritis 07/07/2017    Priority: Medium    Hypertension     Priority: Medium    Hyperlipidemia     Priority: Medium    Hyperglycemia     Priority: Medium    Strain of right tibialis anterior muscle 10/30/2017    Priority: Low   Seasonal allergies     Priority: Low   Colon polyp     Priority: Low   Acute viral sinusitis 12/18/2020   Past Surgical History:  Procedure Laterality Date   Arthroscopic surgery Left 1986-1996    Damaged cartilage Left knee   BACK SURGERY Bilateral 2008   Injury due to a fall. 2 fractured vertebrae, 3 fractured ribs fracture L scapulla, mild concussion   CHOLECYSTECTOMY  2014   Relocation surgery Right    For right elbow ulna nerve    REPAIR of torn retina Right 2014   TONSILECTOMY, ADENOIDECTOMY, BILATERAL MYRINGOTOMY AND TUBES  1958   vena Laser   2009   Remaoval of varicous veins    Family History  Problem Relation Age of Onset   Arthritis Mother        passed at age 56 72   MiscarriagesStiKoreather    Hearing loss Father    Colon cancer Father        died at 69 64Heart disease Paternal Grandmother    Other Daughter        diabetes educator   Miscarriages / StiKoreaughter    Diabetes Mellitus I Daughter        in [place] cancer Neg Hx    Stomach cancer Neg Hx    Colon polyps Neg Hx     Medications- reviewed and updated Current Outpatient Medications  Medication Sig Dispense Refill   atorvastatin (LIPITOR) 40 MG tablet Take 1 tablet (40 mg total) by mouth daily. 90 tablet 3   Black Pepper-Turmeric (TURMERIC CURCUMIN) 05-998 MG CAPS Take 1 capsule by mouth daily.     Calcium Citrate-Vitamin D (CITRACAL + D PO) Take 25 mcg by mouth daily.     CHONDROITIN SULFATE PO Take 200 mg by mouth daily.     diclofenac Sodium (VOLTAREN) 1 % GEL APPLY 2 GRAMS TO AFFECTED AREAS EVERY NIGHT AT BEDTIME 100 g 2   escitalopram (LEXAPRO) 10 MG tablet TAKE ONE TABLET BY MOUTH DAILY 90 tablet 3   fluticasone (FLONASE) 50 MCG/ACT nasal spray Place 1 spray into both nostrils daily. 16 g 1   Glucosamine HCl 1500 MG TABS Take 1,500 mg by mouth daily.     Hyaluronic Acid-Vitamin C (HYALURONIC ACID PO) Take 3.3 mg by mouth daily.     hydrochlorothiazide (MICROZIDE) 12.5 MG capsule Take 1 capsule (12.5 mg total) by mouth daily. 90 capsule 3   latanoprost (XALATAN) 0.005 % ophthalmic solution Place 1 drop into both eyes at bedtime.     MAGNESIUM PO Take 72 mg by  mouth.     metFORMIN (GLUCOPHAGE) 500 MG tablet TAKE 1/2 TABLET BY MOUTH DAILY WITH BREAKFAST 45 tablet 3   methimazole (TAPAZOLE) 5 MG tablet Take 1 tablet (5 mg total) by mouth daily. 90 tablet 2   Omega-3 Fatty Acids (OMEGA-3 FISH OIL PO) Take by mouth.     OVER THE COUNTER MEDICATION Magtein     Probiotic Product (PROBIOTIC ADVANCED PO) Take 2 tablets by mouth daily.     valsartan (DIOVAN) 320 MG tablet TAKE ONE TABLET BY MOUTH DAILY 90 tablet 1   vitamin E 400 UNIT capsule Take 800 Units by mouth daily.     No current facility-administered medications for this visit.    Allergies-reviewed and updated No Known Allergies  Social History   Social History Narrative   Married. Twin daughters. 4 grandkids (2 each)   Moved from [place], [place]      Retired from Production manager: time with grandkids, enjoys travel   Objective  Objective:  BP 136/68   Pulse 63   Temp 98.5 F (36.9 C)   Ht 5' 8.5" (1.74 m)   Wt 175 lb 3.2 oz (79.5 kg)   SpO2 98%   BMI 26.25 kg/m  Gen: NAD, resting comfortably HEENT: Mucous membranes are moist. Oropharynx normal Neck: no thyromegaly CV: RRR faint murmur Lungs: CTAB no crackles, wheeze, rhonchi Abdomen: soft/nontender/nondistended/normal bowel sounds. No rebound or guarding.  Ext: no edema Skin: warm, dry Neuro: grossly normal, moves all extremities, PERRLA    Assessment and Plan  76 y.o. male presenting for annual physical.  Health Maintenance counseling: 1. Anticipatory guidance: Patient counseled regarding regular dental exams -q6 months, eye exams -yearly,  avoiding smoking and second hand smoke, limiting alcohol to 2 beverages per day - rare social, no illicit drugs .   2. Risk factor reduction:  Advised patient of need for regular exercise and diet rich and fruits and vegetables to reduce risk of heart attack and stroke.  Exercise- trying to exercise regularly.  Diet/weight management-weight stable  with thyroid in better condition.  Wt Readings from Last 3 Encounters:  08/17/21 175 lb 3.2 oz (79.5 kg)  07/10/21 178 lb 9.6 oz (81 kg)  06/25/21 178 lb (80.7 kg)  3. Immunizations/screenings/ancillary studies- recommended  waiting on covid vaccine until probably October - when new vaccine released   Immunization History  Administered Date(s) Administered   Fluad Quad(high Dose 65+) 09/22/2018, 10/13/2019   Influenza, High Dose Seasonal PF 10/10/2017, 10/27/2020   MMR 04/14/2013   Moderna Covid-19 Vaccine Bivalent Booster 8yr & up 05/18/2020   Moderna SARS-COV2 Booster Vaccination 05/18/2020   PFIZER(Purple Top)SARS-COV-2 Vaccination 02/28/2019, 03/25/2019, 10/22/2019, 11/13/2020   PNEUMOCOCCAL CONJUGATE-20 07/13/2020   Pneumococcal Conjugate-13 07/01/2013   Pneumococcal Polysaccharide-23 01/03/2012   Tdap 05/17/2013   Zoster Recombinat (Shingrix) 10/12/2017, 02/17/2018   Zoster, Live 08/02/2010   4. Prostate cancer screening-  knows guidelines stop at age 89but he has wanted to continue to check. Nocturia once a night  Lab Results  Component Value Date   PSA 0.94 07/13/2020   PSA 1.22 04/08/2017   5. Colon cancer screening -  colonoscopy 11/12/16 and upcoming repeat with them- has had preliminary visit 6. Skin cancer screening- sees dermatology yearly. advised regular sunscreen use. Denies worrisome, changing, or new skin lesions.  7. Smoking associated screening (lung cancer screening, AAA screen 65-75, UA)- Never smoker 8. STD screening - monogmous- opts out  Status of chronic or acute concerns   #Stiffer [facility]  #restless legs - achilles stretches and deep knee bends help if does before bed- can still happen in middle of night. Magtein helps some as well through Dr. RPaulla Fore #hypertension S: medication: valsartan 320 mg, hctz 12.5 mg BP Readings from Last 3 Encounters:  08/17/21 136/68  07/10/21 122/68  06/25/21 120/74  A/P:  Controlled. Continue current medications.   #hyperthyroidism/prior hypothyroidism S: compliant On thyroid medication-methimazole 5 mg daily  - follows with endocrinology  Lab Results  Component Value Date   TSH 10.06 (H) 06/25/2021    A/P:he asks me to go ahead and update labs with recent changes- also planned in September with endo- continue current meds for now   #hyperlipidemia S: Medication: atorvastatin 40 mg- lfts high in past but normal lately Lab Results  Component Value Date   CHOL 136 07/13/2020   HDL 44.30 07/13/2020   LDLCALC 76 07/13/2020   LDLDIRECT 56.0 02/05/2021   TRIG 79.0 07/13/2020   CHOLHDL 3 07/13/2020   A/P: hopefully stable- update lipid panel today. Continue current meds for now  # Hyperglycemia/insulin resistance/prediabetes S:  Medication: metformin 250 mg  with breakfast starting April 2022 Lab Results  Component Value Date   HGBA1C 6.0 02/05/2021   HGBA1C 6.2 07/13/2020   HGBA1C 5.7 (H) 12/28/2019   A/P: hopefully stable- update a1c today. Continue current meds for now  #adult ADD- on adderall in the past doing ok without meds recently- prior Dr. HBlane Oharaof psychology advised- has done ok off of this  #OCD- remains on lexapro 10 mg- doing reasonably well    # Heart murmur- seen cardiology each October. Echo from 10/2016 showed mild mitral regurgitation only. Only mild abnormalities on echo 01/02/2018 -stable today/faint - wants to hold off on repeat echo or seeing cardiology unless worsens   Recommended follow up: Return in about 6 months (around 02/17/2022) for followup or sooner if needed.Schedule b4 you leave. Future Appointments  Date Time Provider [place] 09/26/2021  9:15 AM MHayden Pedro MD [facility] None  09/27/2021  9:15 AM [facility] LAB [facility] None  12/27/2021  9:10 AM [name], IMelanie Crazier MD [facility] None  03/18/2022  1:00 PM [facility] HEALTH COACH [facility] 06/03/2022  2:00 PM [facility] CCM PHARMACIST [facility]  [facility]   Lab/Order associations: fasting- will get labs at [place]   1. Preventative health care  Z00.00     2. Hyperthyroidism  E05.90 TSH    T4, free    T3, free    3. Hyperlipidemia, unspecified hyperlipidemia type  E78.5 Comprehensive metabolic panel    CBC with Differential/Platelet    Lipid panel    4. Hyperglycemia  R73.9 Hemoglobin A1c    5. Primary hypertension  I10     6. Nocturia  R35.1 PSA    7. Restless legs  G25.81 IBC + Ferritin      No orders of the defined types were placed in this encounter.   Return precautions advised.  [name], MD

## 2021-08-18 ENCOUNTER — Encounter: Payer: Self-pay | Admitting: Family Medicine

## 2021-08-24 ENCOUNTER — Encounter: Payer: Self-pay | Admitting: Internal Medicine

## 2021-09-06 ENCOUNTER — Encounter: Payer: Self-pay | Admitting: Family Medicine

## 2021-09-12 ENCOUNTER — Telehealth: Payer: Self-pay | Admitting: Pharmacist

## 2021-09-12 NOTE — Progress Notes (Signed)
Chronic Care Management Pharmacy Assistant   Name: Darius Long.  MRN: [id number] DOB: [birth year]   Reason for Encounter: Hypertension Adherence Call    Recent office visits:  08/17/2021 OV (PCP) [name], MD; no medication changes indicated.  Recent consult visits:  07/10/2021 OV Gertie Fey) Nelida Meuse III, MD; no medication changes indicated.  06/25/2021 OV (Endo) [name], [name], MD; Decrease methimazole 5 mg, 1  tablet daily  Hospital visits:  None in previous 6 months  Medications: Outpatient Encounter Medications as of 09/12/2021  Medication Sig Note   atorvastatin (LIPITOR) 40 MG tablet Take 1 tablet (40 mg total) by mouth daily.    Black Pepper-Turmeric (TURMERIC CURCUMIN) 05-998 MG CAPS Take 1 capsule by mouth daily.    Calcium Citrate-Vitamin D (CITRACAL + D PO) Take 25 mcg by mouth daily.    CHONDROITIN SULFATE PO Take 200 mg by mouth daily.    diclofenac Sodium (VOLTAREN) 1 % GEL APPLY 2 GRAMS TO AFFECTED AREAS EVERY NIGHT AT BEDTIME    escitalopram (LEXAPRO) 10 MG tablet TAKE ONE TABLET BY MOUTH DAILY    fluticasone (FLONASE) 50 MCG/ACT nasal spray Place 1 spray into both nostrils daily.    Glucosamine HCl 1500 MG TABS Take 1,500 mg by mouth daily.    Hyaluronic Acid-Vitamin C (HYALURONIC ACID PO) Take 3.3 mg by mouth daily.    hydrochlorothiazide (MICROZIDE) 12.5 MG capsule Take 1 capsule (12.5 mg total) by mouth daily.    latanoprost (XALATAN) 0.005 % ophthalmic solution Place 1 drop into both eyes at bedtime.    MAGNESIUM PO Take 72 mg by mouth.    metFORMIN (GLUCOPHAGE) 500 MG tablet TAKE 1/2 TABLET BY MOUTH DAILY WITH BREAKFAST    methimazole (TAPAZOLE) 5 MG tablet Take 1 tablet (5 mg total) by mouth daily.    Omega-3 Fatty Acids (OMEGA-3 FISH OIL PO) Take by mouth. 03/03/2020: '690mg'$  epa, dha    OVER THE COUNTER MEDICATION Magtein    Probiotic Product (PROBIOTIC ADVANCED PO) Take 2 tablets by mouth daily.    valsartan (DIOVAN)  320 MG tablet TAKE ONE TABLET BY MOUTH DAILY    vitamin E 400 UNIT capsule Take 800 Units by mouth daily.    No facility-administered encounter medications on file as of 09/12/2021.   Reviewed chart prior to disease state call. Spoke with patient regarding BP  Recent Office Vitals: BP Readings from Last 3 Encounters:  08/17/21 136/68  07/10/21 122/68  06/25/21 120/74   Pulse Readings from Last 3 Encounters:  08/17/21 63  07/10/21 77  06/25/21 61    Wt Readings from Last 3 Encounters:  08/17/21 175 lb 3.2 oz (79.5 kg)  07/10/21 178 lb 9.6 oz (81 kg)  06/25/21 178 lb (80.7 kg)     Kidney Function Lab Results  Component Value Date/Time   CREATININE 0.97 08/17/2021 12:19 PM   CREATININE 0.83 02/05/2021 11:19 AM   CREATININE 0.65 (L) 12/28/2019 04:31 PM   GFR 75.58 08/17/2021 12:19 PM       Latest Ref Rng & Units 08/17/2021   12:19 PM 02/05/2021   11:19 AM 07/13/2020    4:23 PM  BMP  Glucose 70 - 99 mg/dL 92  63  86   BUN 6 - 23 mg/dL '21  22  22   '$ Creatinine 0.40 - 1.50 mg/dL 0.97  0.83  0.97   Sodium 135 - 145 mEq/L 139  139  138   Potassium 3.5 - 5.1 mEq/L 4.2  4.7  4.1   Chloride 96 - 112 mEq/L 101  106  100   CO2 19 - 32 mEq/L 33  27  33   Calcium 8.4 - 10.5 mg/dL 9.0  9.0  9.2     Current antihypertensive regimen:  HCTZ 12.5 mg daily Valsartan 320 mg daily  How often are you checking your Blood Pressure? several times per month  Current home BP readings: 130/70  What recent interventions/DTPs have been made by any provider to improve Blood Pressure control since last CPP Visit: No recent interventions or DTPs.  Any recent hospitalizations or ED visits since last visit with CPP? No  What diet changes have been made to improve Blood Pressure Control?  No recent diet changes.  What exercise is being done to improve your Blood Pressure Control?  Patient walks regularly and does pilates once a week.  Adherence Review: Is the patient currently on ACE/ARB  medication? Yes Does the patient have >5 day gap between last estimated fill dates? No   Care Gaps: Medicare Annual Wellness: Completed 03/09/2021 Hemoglobin A1C: 6.0% on 02/05/2021 Colonoscopy: Next due on 11/12/2021  Future Appointments  Date Time Provider [place]  09/26/2021  9:15 AM Hayden Pedro, MD [facility] None  09/27/2021  9:15 AM [facility] LAB [facility] None  12/27/2021  9:10 AM [name], [name], MD [facility] None  02/14/2022  8:20 AM [name], MD [facility] P[facility]  03/18/2022  1:00 PM [facility] HEALTH COACH [facility]  06/03/2022  2:00 PM [facility] CCM PHARMACIST [facility]   Star Rating Drugs: Atorvastatin 40 mg last filled 07/10/2021 90 DS Metformin 500 mg last filled 05/22/2021 90 DS Valsartan 320 mg last filled 07/11/2021 90 DS  April D Calhoun, [place] Pharmacist Assistant [phone]

## 2021-09-20 ENCOUNTER — Telehealth: Payer: Self-pay | Admitting: Gastroenterology

## 2021-09-21 ENCOUNTER — Encounter: Payer: Self-pay | Admitting: Family Medicine

## 2021-09-26 ENCOUNTER — Encounter: Payer: Self-pay | Admitting: Family Medicine

## 2021-09-26 ENCOUNTER — Encounter (INDEPENDENT_AMBULATORY_CARE_PROVIDER_SITE_OTHER): Payer: PPO | Admitting: Ophthalmology

## 2021-09-26 DIAGNOSIS — H43813 Vitreous degeneration, bilateral: Secondary | ICD-10-CM

## 2021-09-26 DIAGNOSIS — H35033 Hypertensive retinopathy, bilateral: Secondary | ICD-10-CM | POA: Diagnosis not present

## 2021-09-26 DIAGNOSIS — H33301 Unspecified retinal break, right eye: Secondary | ICD-10-CM

## 2021-09-26 DIAGNOSIS — I10 Essential (primary) hypertension: Secondary | ICD-10-CM

## 2021-09-27 ENCOUNTER — Encounter: Payer: Self-pay | Admitting: Family Medicine

## 2021-09-27 ENCOUNTER — Other Ambulatory Visit: Payer: PPO

## 2021-09-27 ENCOUNTER — Telehealth (INDEPENDENT_AMBULATORY_CARE_PROVIDER_SITE_OTHER): Payer: PPO | Admitting: Family Medicine

## 2021-09-27 VITALS — Ht 68.5 in | Wt 175.2 lb

## 2021-09-27 DIAGNOSIS — U071 COVID-19: Secondary | ICD-10-CM | POA: Diagnosis not present

## 2021-09-27 MED ORDER — NIRMATRELVIR/RITONAVIR (PAXLOVID)TABLET: 3.0000 | ORAL_TABLET | Freq: Two times a day (BID) | ORAL | 0 refills | Status: AC

## 2021-09-27 NOTE — Patient Instructions (Addendum)
HOME CARE TIPS:   -I sent the medication(s) we discussed to your pharmacy: Meds ordered this encounter  Medications   nirmatrelvir/ritonavir EUA (PAXLOVID) 20 x 150 MG & 10 x '100MG'$  TABS    Sig: Take 3 tablets by mouth 2 (two) times daily for 5 days. (Take nirmatrelvir 150 mg two tablets twice daily for 5 days and ritonavir 100 mg one tablet twice daily for 5 days) Patient GFR is > 60    Dispense:  30 tablet    Refill:  0     -I sent in the [facility] treatment or referral you requested per our discussion. Please see the information provided below and discuss further with the pharmacist/treatment team.  -If taking Paxlovid, please review all medications, supplement and over the counter drugs with your pharmacist and ask them to check for any interactions. Please make the following changes to your regular medications while taking Paxlovid: *Stop your Lipitor (atorvastatin) and restart 3 days after finishing the Paxlovid *Monitor your blood pressure and if running low/feeling lightheaded or dizzy with low BP then stop your valsartan or take every other day while taking the Paxlovid.   -there is a chance of rebound illness with covid after improving. This can happen whether or not you take an antiviral treatment. If you become sick again with covid after getting better, please schedule a follow up virtual visit and isolate again.  -can use tylenol  if needed for fevers, aches and pains per instructions  -nasal saline sinus rinses twice daily  -stay hydrated, drink plenty of fluids and eat small healthy meals - avoid dairy  -follow up with your doctor in 2-3 days unless improving and feeling better  -stay home while sick, except to seek medical care. If you have COVID19, you will likely be contagious for 7-10 days. Flu or Influenza is likely contagious for about 7 days. Other respiratory viral infections remain contagious for 5-10+ days depending on the virus and many other factors. Wear a good  mask that fits snugly (such as N95 or KN95) if around others to reduce the risk of transmission.  It was nice to meet you today, and I really hope you are feeling better soon. I help Darius Long out with telemedicine visits on Tuesdays and Thursdays and am happy to help if you need a follow up virtual visit on those days. Otherwise, if you have any concerns or questions following this visit please schedule a follow up visit with your Primary Care doctor or seek care at a local urgent care clinic to avoid delays in care.    Seek in person care or schedule a follow up video visit promptly if your symptoms worsen, new concerns arise or you are not improving with treatment. Call 911 and/or seek emergency care if your symptoms are severe or life threatening.    See the following link for the most recent information regarding Paxlovid:  [email or web address]   Nirmatrelvir; Ritonavir Tablets What is this medication? NIRMATRELVIR; RITONAVIR (NIR ma TREL vir; ri TOE na veer) treats mild to moderate COVID-19. It may help people who are at high risk of developing severe illness. This medication works by limiting the spread of the virus in your body. The FDA has allowed the emergency use of this medication. This medicine may be used for other purposes; ask your health care provider or pharmacist if you have questions. COMMON BRAND NAME(S): PAXLOVID What should I tell my care team before I take this medication? They need to know if  you have any of these conditions: Any allergies Any serious illness Kidney disease Liver disease An unusual or allergic reaction to nirmatrelvir, ritonavir, other medications, foods, dyes, or preservatives Pregnant or trying to get pregnant Breast-feeding How should I use this medication? This product contains 2 different medications that are packaged together. For the standard dose, take 2 pink tablets of nirmatrelvir with 1 white tablet of ritonavir (3 tablets total) by mouth  with water twice daily. Talk to your care team if you have kidney disease. You may need a different dose. Swallow the tablets whole. You can take it with or without food. If it upsets your stomach, take it with food. Take all of this medication unless your care team tells you to stop it early. Keep taking it even if you think you are better. Talk to your care team about the use of this medication in children. While it may be prescribed for children as young as 12 years for selected conditions, precautions do apply. Overdosage: If you think you have taken too much of this medicine contact a poison control center or emergency room at once. NOTE: This medicine is only for you. Do not share this medicine with others. What if I miss a dose? If you miss a dose, take it as soon as you can unless it is more than 8 hours late. If it is more than 8 hours late, skip the missed dose. Take the next dose at the normal time. Do not take extra or 2 doses at the same time to make up for the missed dose. What may interact with this medication? Do not take this medication with any of the following medications: Alfuzosin Certain medications for anxiety or sleep like midazolam, triazolam Certain medications for cancer like apalutamide, enzalutamide Certain medications for cholesterol like lovastatin, simvastatin Certain medications for irregular heart beat like amiodarone, dronedarone, flecainide, propafenone, quinidine Certain medications for pain like meperidine, piroxicam Certain medications for psychotic disorders like clozapine, lurasidone, pimozide Certain medications for seizures like carbamazepine, phenobarbital, phenytoin Colchicine Eletriptan Eplerenone Ergot alkaloids like dihydroergotamine, ergonovine, ergotamine, methylergonovine Finerenone Flibanserin Ivabradine Lomitapide Naloxegol Ranolazine Rifampin Sildenafil Silodosin St. John's Wort Tolvaptan Ubrogepant Voclosporin This medication may  also interact with the following medications: Bedaquiline Birth control pills Bosentan Certain antibiotics like erythromycin or clarithromycin Certain medications for blood pressure like amlodipine, diltiazem, felodipine, nicardipine, nifedipine Certain medications for cancer like abemaciclib, ceritinib, dasatinib, encorafenib, ibrutinib, ivosidenib, neratinib, nilotinib, venetoclax, vinblastine, vincristine Certain medications for cholesterol like atorvastatin, rosuvastatin Certain medications for depression like bupropion, trazodone Certain medications for fungal infections like isavuconazonium, itraconazole, ketoconazole, voriconazole Certain medications for hepatitis C like elbasvir; grazoprevir, dasabuvir; ombitasvir; paritaprevir; ritonavir, glecaprevir; pibrentasvir, sofosbuvir; velpatasvir; voxilaprevir Certain medications for HIV or AIDS Certain medications for irregular heartbeat like lidocaine Certain medications that treat or prevent blood clots like rivaroxaban, warfarin Digoxin Fentanyl Medications that lower your chance of fighting infection like cyclosporine, sirolimus, tacrolimus Methadone Quetiapine Rifabutin Salmeterol Steroid medications like betamethasone, budesonide, ciclesonide, dexamethasone, fluticasone, methylprednisone, mometasone, triamcinolone This list may not describe all possible interactions. Give your health care provider a list of all the medicines, herbs, non-prescription drugs, or dietary supplements you use. Also tell them if you smoke, drink alcohol, or use illegal drugs. Some items may interact with your medicine. What should I watch for while using this medication? Your condition will be monitored carefully while you are receiving this medication. Visit your care team for regular checkups. Tell your care team if your symptoms do not start to  get better or if they get worse. If you have untreated HIV infection, this medication may lead to some HIV  medications not working as well in the future. Birth control may not work properly while you are taking this medication. Talk to your care team about using an extra method of birth control. What side effects may I notice from receiving this medication? Side effects that you should report to your care team as soon as possible: Allergic reactions--skin rash, itching, hives, swelling of the face, lips, tongue, or throat Liver injury--right upper belly pain, loss of appetite, nausea, light-colored stool, dark yellow or brown urine, yellowing skin or eyes, unusual weakness or fatigue Redness, blistering, peeling, or loosening of the skin, including inside the mouth Side effects that usually do not require medical attention (report these to your care team if they continue or are bothersome): Change in taste Diarrhea General discomfort and fatigue Increase in blood pressure Muscle pain Nausea Stomach pain This list may not describe all possible side effects. Call your doctor for medical advice about side effects. You may report side effects to FDA at 1-800-FDA-1088. Where should I keep my medication? Keep out of the reach of children and pets. Store at room temperature between 20 and 25 degrees C (68 and 77 degrees F). Get rid of any unused medication after the expiration date. To get rid of medications that are no longer needed or have expired: Take the medication to a medication take-back program. Check with your pharmacy or law enforcement to find a location. If you cannot return the medication, check the label or package insert to see if the medication should be thrown out in the garbage or flushed down the toilet. If you are not sure, ask your care team. If it is safe to put it in the trash, take the medication out of the container. Mix the medication with cat litter, dirt, coffee grounds, or other unwanted substance. Seal the mixture in a bag or container. Put it in the trash. NOTE: This sheet  is a summary. It may not cover all possible information. If you have questions about this medicine, talk to your doctor, pharmacist, or health care provider.  2022 Elsevier/Gold Standard (2020-10-09 00:00:00)

## 2021-09-27 NOTE — Progress Notes (Signed)
Virtual Visit via Video Note  I connected with Darius Long  on 09/27/21 at 11:40 AM EDT by a video enabled telemedicine application and verified that I am speaking with the correct person using two identifiers.  Location patient: [name] Location provider:work or home office Persons participating in the virtual visit: patient, provider, patient's wife  I discussed the limitations and requested verbal permission for telemedicine visit. The patient expressed understanding and agreed to proceed.   HPI:  Acute telemedicine visit for Covid19: -Onset: yesterday, tested positive last night -Symptoms include: felt tired yesterday, cough, nasal congestion, PND, tickle in the throat, mild ha at times -Denies:fevers, body ache, CP, SOB, vomiting, diarrhea, inability to tol oral intake -Has tried: antihistamine  -Pertinent past medical history: see below, GFR 75 in 7/23, denies hx of covid to his knowledge -Pertinent medication allergies:No Known Allergies -COVID-19 vaccine status: has had all doses and boosters per his report Immunization History  Administered Date(s) Administered   Fluad Quad(high Dose 65+) 09/22/2018, 10/13/2019   Influenza, High Dose Seasonal PF 10/10/2017, 10/27/2020   MMR 04/14/2013   Moderna Covid-19 Vaccine Bivalent Booster 43yr & up 05/18/2020   Moderna SARS-COV2 Booster Vaccination 05/18/2020   PFIZER(Purple Top)SARS-COV-2 Vaccination 02/28/2019, 03/25/2019, 10/22/2019, 11/13/2020   PNEUMOCOCCAL CONJUGATE-20 07/13/2020   Pneumococcal Conjugate-13 07/01/2013   Pneumococcal Polysaccharide-23 01/03/2012   Tdap 05/17/2013   Zoster Recombinat (Shingrix) 10/12/2017, 02/17/2018   Zoster, Live 08/02/2010     ROS: See pertinent positives and negatives per HPI.  Past Medical History:  Diagnosis Date   Anxiety    Arthritis    Attention deficit disorder (ADD) in adult    Adderall 333mtablet- half tablet twice a day.    Chicken pox    Colon polyp    dad died of colon cancer-  q5 year colonoscopy. 2018   Diverticulosis    Encounter for blood transfusion    related to overtreatment with PCN. became very ill.    Gall stones    Heart murmur    Sees cardiology October each year due to murmur after fall- get records. Seen for heart murmur heard 2010. We discused may not need to see cardiology. Every other year stres test   Hyperglycemia    a1c 5.9 in 03/2017   Hyperlipidemia    atorvastatin 4024m Hypertension    losartan 50, hctz 12.5mg33mHypothyroidism    levothyroxine 100 mcg 6 days a week as of mid 2019.    Seasonal allergies    prn antihistamine    Past Surgical History:  Procedure Laterality Date   Arthroscopic surgery Left 1986-1996   Damaged cartilage Left knee   BACK SURGERY Bilateral 2008   Injury due to a fall. 2 fractured vertebrae, 3 fractured ribs fracture L scapulla, mild concussion   CHOLECYSTECTOMY  2014   Relocation surgery Right    For right elbow ulna nerve    REPAIR of torn retina Right 2014   TONSILECTOMY, ADENOIDECTOMY, BILATERAL MYRINGOTOMY AND TUBES  1958   vena Laser   2009   Remaoval of varicous veins     Current Outpatient Medications:    nirmatrelvir/ritonavir EUA (PAXLOVID) 20 x 150 MG & 10 x 100MG TABS, Take 3 tablets by mouth 2 (two) times daily for 5 days. (Take nirmatrelvir 150 mg two tablets twice daily for 5 days and ritonavir 100 mg one tablet twice daily for 5 days) Patient GFR is > 60, Disp: 30 tablet, Rfl: 0   atorvastatin (LIPITOR) 40 MG tablet,  Take 1 tablet (40 mg total) by mouth daily., Disp: 90 tablet, Rfl: 3   Black Pepper-Turmeric (TURMERIC CURCUMIN) 05-998 MG CAPS, Take 1 capsule by mouth daily., Disp: , Rfl:    Calcium Citrate-Vitamin D (CITRACAL + D PO), Take 25 mcg by mouth daily., Disp: , Rfl:    CHONDROITIN SULFATE PO, Take 200 mg by mouth daily., Disp: , Rfl:    diclofenac Sodium (VOLTAREN) 1 % GEL, APPLY 2 GRAMS TO AFFECTED AREAS EVERY NIGHT AT BEDTIME, Disp: 100 g, Rfl: 2   escitalopram (LEXAPRO)  10 MG tablet, TAKE ONE TABLET BY MOUTH DAILY, Disp: 90 tablet, Rfl: 3   fluticasone (FLONASE) 50 MCG/ACT nasal spray, Place 1 spray into both nostrils daily., Disp: 16 g, Rfl: 1   Glucosamine HCl 1500 MG TABS, Take 1,500 mg by mouth daily., Disp: , Rfl:    Hyaluronic Acid-Vitamin C (HYALURONIC ACID PO), Take 3.3 mg by mouth daily., Disp: , Rfl:    hydrochlorothiazide (MICROZIDE) 12.5 MG capsule, Take 1 capsule (12.5 mg total) by mouth daily., Disp: 90 capsule, Rfl: 3   latanoprost (XALATAN) 0.005 % ophthalmic solution, Place 1 drop into both eyes at bedtime., Disp: , Rfl:    MAGNESIUM PO, Take 72 mg by mouth., Disp: , Rfl:    metFORMIN (GLUCOPHAGE) 500 MG tablet, TAKE 1/2 TABLET BY MOUTH DAILY WITH BREAKFAST, Disp: 45 tablet, Rfl: 3   methimazole (TAPAZOLE) 5 MG tablet, Take 1 tablet (5 mg total) by mouth daily. (Patient taking differently: Take 5 mg by mouth daily. 1 tablet Monday- Saturday 1/2 Sunday), Disp: 90 tablet, Rfl: 2   Omega-3 Fatty Acids (OMEGA-3 FISH OIL PO), Take by mouth., Disp: , Rfl:    OVER THE COUNTER MEDICATION, Magtein, Disp: , Rfl:    Probiotic Product (PROBIOTIC ADVANCED PO), Take 2 tablets by mouth daily., Disp: , Rfl:    valsartan (DIOVAN) 320 MG tablet, TAKE ONE TABLET BY MOUTH DAILY, Disp: 90 tablet, Rfl: 1   vitamin E 400 UNIT capsule, Take 800 Units by mouth daily., Disp: , Rfl:   EXAM:  VITALS per patient if applicable: Today's Vitals   09/27/21 1131  Weight: 175 lb 3.2 oz (79.5 kg)  Height: 5' 8.5" (1.74 m)   Body mass index is 26.25 kg/m. BP: 137/64  GENERAL: alert, oriented, appears well and in no acute distress  HEENT: atraumatic, conjunttiva clear, no obvious abnormalities on inspection of external nose and ears  NECK: normal movements of the head and neck  LUNGS: on inspection no signs of respiratory distress, breathing rate appears normal, no obvious gross SOB, gasping or wheezing  CV: no obvious cyanosis  MS: moves all visible extremities  without noticeable abnormality  PSYCH/NEURO: pleasant and cooperative, no obvious depression or anxiety, speech and thought processing grossly intact  ASSESSMENT AND PLAN:  Discussed the following assessment and plan:  COVID-19   Discussed treatment options, side effect and risk of drug interactions, ideal treatment window, potential complications, isolation and precautions for COVID-19.  Discussed possibility of rebound with or without antivirals. Checked for/reviewed last GFR - listed in HPI if available.  After lengthy discussion, the patient opted for treatment with Paxlovid due to being higher risk for complications of covid or severe disease and other factors. Discussed EUA status of this drug and the fact that there is preliminary limited knowledge of risks/interactions/side effects per EUA document vs possible benefits and precautions. This information was shared with patient during the visit and also was provided in patient instructions. Agrees  to hold statin and supplemenst and monitor BP/Adjust Valsartan dose if low BP. Also, advised that patient discuss risks/interactions and use with pharmacist/treatment team as well. Other symptomatic care measures summarized in patient instructions. Advised to seek prompt virtual visit or in person care if worsening, new symptoms arise, or if is not improving with treatment as expected per our conversation of expected course. Discussed options for follow up care. Did let this patient know that I do telemedicine on Tuesdays and Thursdays for [facility] and those are the days I am logged into the system. Advised to schedule follow up visit with PCP, [facility] virtual visits or [facility] if any further questions or concerns to avoid delays in care.   I discussed the assessment and treatment plan with the patient. The patient was provided an opportunity to ask questions and all were answered. The patient agreed with the plan and demonstrated an understanding of the  instructions.     Lucretia Kern, DO

## 2021-10-03 ENCOUNTER — Encounter: Payer: Self-pay | Admitting: Family Medicine

## 2021-10-05 ENCOUNTER — Encounter: Payer: Self-pay | Admitting: Family Medicine

## 2021-10-05 NOTE — Telephone Encounter (Signed)
Patient has called requesting advise for when he should get flu vac, pneumonia vac, and RSV vac?

## 2021-10-05 NOTE — Telephone Encounter (Signed)
Pt had positive home test today. Please see Telephone encounter 9/15

## 2021-10-07 ENCOUNTER — Encounter: Payer: Self-pay | Admitting: Family Medicine

## 2021-10-08 ENCOUNTER — Encounter: Payer: Self-pay | Admitting: Internal Medicine

## 2021-10-13 ENCOUNTER — Encounter: Payer: Self-pay | Admitting: Family Medicine

## 2021-10-15 ENCOUNTER — Encounter: Payer: Self-pay | Admitting: Gastroenterology

## 2021-10-19 ENCOUNTER — Other Ambulatory Visit (INDEPENDENT_AMBULATORY_CARE_PROVIDER_SITE_OTHER): Payer: PPO

## 2021-10-19 ENCOUNTER — Encounter: Payer: Self-pay | Admitting: Family Medicine

## 2021-10-19 ENCOUNTER — Other Ambulatory Visit: Payer: PPO

## 2021-10-19 DIAGNOSIS — E0590 Thyrotoxicosis, unspecified without thyrotoxic crisis or storm: Secondary | ICD-10-CM

## 2021-10-19 LAB — T4, FREE: Free T4: 0.62 ng/dL (ref 0.60–1.60)

## 2021-10-19 LAB — TSH: TSH: 3.69 u[IU]/mL (ref 0.35–5.50)

## 2021-10-20 LAB — T3: T3, Total: 94 ng/dL (ref 76–181)

## 2021-10-23 DIAGNOSIS — L821 Other seborrheic keratosis: Secondary | ICD-10-CM | POA: Diagnosis not present

## 2021-10-23 DIAGNOSIS — L812 Freckles: Secondary | ICD-10-CM | POA: Diagnosis not present

## 2021-10-23 DIAGNOSIS — D1801 Hemangioma of skin and subcutaneous tissue: Secondary | ICD-10-CM | POA: Diagnosis not present

## 2021-11-06 ENCOUNTER — Ambulatory Visit (AMBULATORY_SURGERY_CENTER): Payer: Self-pay

## 2021-11-06 VITALS — Ht 68.5 in | Wt 178.0 lb

## 2021-11-06 DIAGNOSIS — Z800 Family history of malignant neoplasm of digestive organs: Secondary | ICD-10-CM

## 2021-11-06 MED ORDER — PEG 3350-KCL-NA BICARB-NACL 420 G PO SOLR: 4000.0000 mL | Freq: Once | ORAL | 0 refills | Status: AC

## 2021-11-06 NOTE — Progress Notes (Signed)

## 2021-11-19 ENCOUNTER — Encounter: Payer: Self-pay | Admitting: Gastroenterology

## 2021-11-26 ENCOUNTER — Telehealth: Payer: Self-pay | Admitting: Gastroenterology

## 2021-11-26 NOTE — Telephone Encounter (Signed)
Patient called states he took an Aleve on Friday and Saturday please advise.

## 2021-11-26 NOTE — Telephone Encounter (Signed)
Returned pts call.  Advised him that he is ok to proceed with procedure tomorrow.

## 2021-11-27 ENCOUNTER — Ambulatory Visit (AMBULATORY_SURGERY_CENTER): Payer: PPO | Admitting: Gastroenterology

## 2021-11-27 ENCOUNTER — Encounter: Payer: Self-pay | Admitting: Gastroenterology

## 2021-11-27 VITALS — BP 103/51 | HR 58 | Temp 98.2°F | Resp 12 | Ht 68.5 in | Wt 178.0 lb

## 2021-11-27 DIAGNOSIS — Z800 Family history of malignant neoplasm of digestive organs: Secondary | ICD-10-CM

## 2021-11-27 DIAGNOSIS — Z09 Encounter for follow-up examination after completed treatment for conditions other than malignant neoplasm: Secondary | ICD-10-CM | POA: Diagnosis not present

## 2021-11-27 DIAGNOSIS — Z1211 Encounter for screening for malignant neoplasm of colon: Secondary | ICD-10-CM | POA: Diagnosis not present

## 2021-11-27 DIAGNOSIS — I10 Essential (primary) hypertension: Secondary | ICD-10-CM | POA: Diagnosis not present

## 2021-11-27 DIAGNOSIS — E039 Hypothyroidism, unspecified: Secondary | ICD-10-CM | POA: Diagnosis not present

## 2021-11-27 DIAGNOSIS — Z86010 Personal history of colonic polyps: Secondary | ICD-10-CM | POA: Diagnosis not present

## 2021-11-27 DIAGNOSIS — F419 Anxiety disorder, unspecified: Secondary | ICD-10-CM | POA: Diagnosis not present

## 2021-11-27 MED ORDER — SODIUM CHLORIDE 0.9 % IV SOLN: 500.0000 mL | Freq: Once | INTRAVENOUS | Status: DC

## 2021-11-27 NOTE — Progress Notes (Signed)
Pt's states no medical or surgical changes since previsit or office visit. 

## 2021-11-27 NOTE — Progress Notes (Signed)
To pacu VSS. Report to [email or web address] 

## 2021-11-27 NOTE — Progress Notes (Signed)
History and Physical:  This patient presents for endoscopic testing for: Encounter Diagnosis  Name Primary?   Family history of colon cancer Yes  Father Dx CRC in his late 59's, died in post-op period (? PE)  No polyps last colonoscopy in [place]  - 2018 Patient denies chronic abdominal pain, rectal bleeding, constipation or diarrhea.   Patient is otherwise without complaints or active issues today.   Past Medical History: Past Medical History:  Diagnosis Date   Anxiety    Arthritis    Attention deficit disorder (ADD) in adult    Adderall '30mg'$  tablet- half tablet twice a day.    Chicken pox    Colon polyp    dad died of colon cancer- q5 year colonoscopy. 2018   Diabetes mellitus without complication ([place])    Diverticulosis    Encounter for blood transfusion    related to overtreatment with PCN. became very ill.    Gall stones    Heart murmur    Sees cardiology October each year due to murmur after fall- get records. Seen for heart murmur heard 2010. We discused may not need to see cardiology. Every other year stres test   Hyperglycemia    a1c 5.9 in 03/2017   Hyperlipidemia    atorvastatin '40mg'$    Hypertension    losartan 50, hctz 12.'5mg'$    Hypothyroidism    levothyroxine 100 mcg 6 days a week as of mid 2019.    Seasonal allergies    prn antihistamine     Past Surgical History: Past Surgical History:  Procedure Laterality Date   Arthroscopic surgery Left 1986-1996   Damaged cartilage Left knee   BACK SURGERY Bilateral 2008   Injury due to a fall. 2 fractured vertebrae, 3 fractured ribs fracture L scapulla, mild concussion   CHOLECYSTECTOMY  2014   Relocation surgery Right    For right elbow ulna nerve    REPAIR of torn retina Right 2014   TONSILECTOMY, ADENOIDECTOMY, BILATERAL MYRINGOTOMY AND TUBES  1958   vena Laser   2009   Remaoval of varicous veins    Allergies: No Known Allergies  Outpatient Meds: Current Outpatient Medications  Medication Sig Dispense  Refill   atorvastatin (LIPITOR) 40 MG tablet Take 1 tablet (40 mg total) by mouth daily. 90 tablet 3   Black Pepper-Turmeric (TURMERIC CURCUMIN) 05-998 MG CAPS Take 1 capsule by mouth daily.     Calcium Citrate-Vitamin D (CITRACAL + D PO) Take 25 mcg by mouth daily.     CHONDROITIN SULFATE PO Take 200 mg by mouth daily.     diclofenac Sodium (VOLTAREN) 1 % GEL APPLY 2 GRAMS TO AFFECTED AREAS EVERY NIGHT AT BEDTIME 100 g 2   escitalopram (LEXAPRO) 10 MG tablet TAKE ONE TABLET BY MOUTH DAILY 90 tablet 3   Glucosamine HCl 1500 MG TABS Take 1,500 mg by mouth daily. Glucosamine 1005 mg / day, condroitin 134 mg / day, calcium fructoborate 145 mg / day, MSM 1005 mg per day, Hyaluronic acid 2 mg / day     Hyaluronic Acid-Vitamin C (HYALURONIC ACID PO) Take 3.3 mg by mouth daily.     latanoprost (XALATAN) 0.005 % ophthalmic solution Place 1 drop into both eyes at bedtime.     MAGNESIUM PO Take 450 mg by mouth.     metFORMIN (GLUCOPHAGE) 500 MG tablet TAKE 1/2 TABLET BY MOUTH DAILY WITH BREAKFAST 45 tablet 3   methimazole (TAPAZOLE) 5 MG tablet Take 1 tablet (5 mg total) by mouth daily. (  Patient taking differently: Take 5 mg by mouth daily. 1 tablet Monday- Saturday 1/2 Sunday) 90 tablet 2   Omega-3 Fatty Acids (OMEGA-3 FISH OIL PO) Take by mouth. EPA 1380 mg / day, DHA 520 mg / day     OVER THE COUNTER MEDICATION Magtein     OVER THE COUNTER MEDICATION Take 2 capsules by mouth daily at 8 pm. Calcium 240 mg, vitamin D 100 mcg, Magnesium 120 mg     Probiotic Product (PROBIOTIC ADVANCED PO) Take 2 tablets by mouth daily. 30 billion cfu's     valsartan (DIOVAN) 320 MG tablet TAKE ONE TABLET BY MOUTH DAILY 90 tablet 1   vitamin E 400 UNIT capsule Take 800 Units by mouth daily.     fluticasone (FLONASE) 50 MCG/ACT nasal spray Place 1 spray into both nostrils daily. (Patient not taking: Reported on 11/06/2021) 16 g 1   hydrochlorothiazide (MICROZIDE) 12.5 MG capsule Take 1 capsule (12.5 mg total) by mouth daily.  90 capsule 3   Current Facility-Administered Medications  Medication Dose Route Frequency Provider Last Rate Last Admin   0.9 %  sodium chloride infusion  500 mL Intravenous Once Danis, Kirke Corin, MD          ___________________________________________________________________ Objective   Exam:  BP 129/77   Pulse (!) 58   Temp 98.2 F (36.8 C)   Ht 5' 8.5" (1.74 m)   Wt 178 lb (80.7 kg)   SpO2 96%   BMI 26.67 kg/m   CV: regular , S1/S2 Resp: clear to auscultation bilaterally, normal RR and effort noted GI: soft, no tenderness, with active bowel sounds.   Assessment: Encounter Diagnosis  Name Primary?   Family history of colon cancer Yes     Plan: Colonoscopy  The benefits and risks of the planned procedure were described in detail with the patient or (when appropriate) their health care proxy.  Risks were outlined as including, but not limited to, bleeding, infection, perforation, adverse medication reaction leading to cardiac or pulmonary decompensation, pancreatitis (if ERCP).  The limitation of incomplete mucosal visualization was also discussed.  No guarantees or warranties were given.    The patient is appropriate for an endoscopic procedure in the ambulatory setting.   - Wilfrid Lund, MD

## 2021-11-27 NOTE — Op Note (Signed)
[place] Patient Name: Lenzy Kerschner Procedure Date: 11/27/2021 7:21 AM MRN: [id number] Endoscopist: Beckett. Loletha Carrow , MD, [id number] Age: 76 Referring MD:  Date of Birth: [birth year] Gender: Male Account #: [id number] Procedure:                Colonoscopy Indications:              Screening in patient at increased risk: Colorectal                            cancer in father 44 or older                           no polyps in 2018 (out of state) Medicines:                Monitored Anesthesia Care Procedure:                Pre-Anesthesia Assessment:                           - Prior to the procedure, a History and Physical                            was performed, and patient medications and                            allergies were reviewed. The patient's tolerance of                            previous anesthesia was also reviewed. The risks                            and benefits of the procedure and the sedation                            options and risks were discussed with the patient.                            All questions were answered, and informed consent                            was obtained. Prior Anticoagulants: The patient has                            taken no anticoagulant or antiplatelet agents. ASA                            Grade Assessment: II - A patient with mild systemic                            disease. After reviewing the risks and benefits,                            the patient was deemed in satisfactory condition to  undergo the procedure.                           After obtaining informed consent, the colonoscope                            was passed under direct vision. Throughout the                            procedure, the patient's blood pressure, pulse, and                            oxygen saturations were monitored continuously. The                            Olympus Scope [id number] was introduced through the                             anus and advanced to the the cecum, identified by                            appendiceal orifice and ileocecal valve. The                            colonoscopy was performed without difficulty. The                            patient tolerated the procedure well. The quality                            of the bowel preparation was good. The ileocecal                            valve, appendiceal orifice, and rectum were                            photographed. Scope In: 8:04:00 AM Scope Out: 8:17:32 AM Scope Withdrawal Time: 0 hours 8 minutes 24 seconds  Total Procedure Duration: 0 hours 13 minutes 32 seconds  Findings:                 The perianal and digital rectal examinations were                            normal.                           Repeat examination of right colon under NBI                            performed.                           The entire examined colon appeared normal on direct  and retroflexion views. Complications:            No immediate complications. Estimated Blood Loss:     Estimated blood loss: none. Impression:               - The entire examined colon is normal on direct and                            retroflexion views.                           - No specimens collected. Recommendation:           - Patient has a contact number available for                            emergencies. The signs and symptoms of potential                            delayed complications were discussed with the                            patient. Return to normal activities tomorrow.                            Written discharge instructions were provided to the                            patient.                           - Resume previous diet.                           - Continue present medications.                           - No repeat screening colonoscopy recommended. Alinda Egolf L. Loletha Carrow, MD 11/27/2021 8:24:23 AM This  report has been signed electronically.

## 2021-11-27 NOTE — Patient Instructions (Signed)
Resume previous diet. Continue present medications.   No repeat screening colonoscopy recommended.   YOU HAD AN ENDOSCOPIC PROCEDURE TODAY AT [facility]:   Refer to the procedure report that was given to you for any specific questions about what was found during the examination.  If the procedure report does not answer your questions, please call your gastroenterologist to clarify.  If you requested that your care partner not be given the details of your procedure findings, then the procedure report has been included in a sealed envelope for you to review at your convenience later.  YOU SHOULD EXPECT: Some feelings of bloating in the abdomen. Passage of more gas than usual.  Walking can help get rid of the air that was put into your GI tract during the procedure and reduce the bloating. If you had a lower endoscopy (such as a colonoscopy or flexible sigmoidoscopy) you may notice spotting of blood in your stool or on the toilet paper. If you underwent a bowel prep for your procedure, you may not have a normal bowel movement for a few days.  Please Note:  You might notice some irritation and congestion in your nose or some drainage.  This is from the oxygen used during your procedure.  There is no need for concern and it should clear up in a day or so.  SYMPTOMS TO REPORT IMMEDIATELY:  Following lower endoscopy (colonoscopy or flexible sigmoidoscopy):  Excessive amounts of blood in the stool  Significant tenderness or worsening of abdominal pains  Swelling of the abdomen that is new, acute  Fever of 100F or higher  For urgent or emergent issues, a gastroenterologist can be reached at any hour by calling [phone]. Do not use MyChart messaging for urgent concerns.    DIET:  We do recommend a small meal at first, but then you may proceed to your regular diet.  Drink plenty of fluids but you should avoid alcoholic beverages for 24 hours.  ACTIVITY:  You should plan to take  it easy for the rest of today and you should NOT DRIVE or use heavy machinery until tomorrow (because of the sedation medicines used during the test).    FOLLOW UP: Our staff will call the number listed on your records the next business day following your procedure.  We will call around 7:15- 8:00 am to check on you and address any questions or concerns that you may have regarding the information given to you following your procedure. If we do not reach you, we will leave a message.     If any biopsies were taken you will be contacted by phone or by letter within the next 1-3 weeks.  Please call us at [phone] if you have not heard about the biopsies in 3 weeks.    SIGNATURES/CONFIDENTIALITY: You and/or your care partner have signed paperwork which will be entered into your electronic medical record.  These signatures attest to the fact that that the information above on your After Visit Summary has been reviewed and is understood.  Full responsibility of the confidentiality of this discharge information lies with you and/or your care-partner.

## 2021-11-28 ENCOUNTER — Telehealth: Payer: Self-pay

## 2021-11-28 NOTE — Telephone Encounter (Signed)
  Follow up Call-     11/27/2021    7:13 AM  Call back number  Post procedure Call Back phone  # [phone]  Permission to leave phone message Yes     Patient questions:  Do you have a fever, pain , or abdominal swelling? No. Pain Score  0 *  Have you tolerated food without any problems? Yes.    Have you been able to return to your normal activities? Yes.    Do you have any questions about your discharge instructions: Diet   No. Medications  No. Follow up visit  No.  Do you have questions or concerns about your Care? No.  Actions: * If pain score is 4 or above: No action needed, pain <4.

## 2021-12-27 ENCOUNTER — Encounter: Payer: Self-pay | Admitting: Internal Medicine

## 2021-12-27 ENCOUNTER — Ambulatory Visit (INDEPENDENT_AMBULATORY_CARE_PROVIDER_SITE_OTHER): Payer: PPO | Admitting: Internal Medicine

## 2021-12-27 VITALS — BP 132/78 | HR 69 | Ht 68.8 in | Wt 176.0 lb

## 2021-12-27 DIAGNOSIS — E0590 Thyrotoxicosis, unspecified without thyrotoxic crisis or storm: Secondary | ICD-10-CM | POA: Diagnosis not present

## 2021-12-27 DIAGNOSIS — E0500 Thyrotoxicosis with diffuse goiter without thyrotoxic crisis or storm: Secondary | ICD-10-CM | POA: Diagnosis not present

## 2021-12-27 LAB — T4, FREE: Free T4: 0.56 ng/dL — ABNORMAL LOW (ref 0.60–1.60)

## 2021-12-27 LAB — TSH: TSH: 4.91 u[IU]/mL (ref 0.35–5.50)

## 2021-12-27 MED ORDER — METHIMAZOLE 5 MG PO TABS: 5.0000 mg | ORAL_TABLET | ORAL | 3 refills | Status: DC

## 2021-12-27 NOTE — Progress Notes (Signed)
Name: Darius Long.  MRN/ DOB: [id number], 02/11/1945    Age/ Sex: 76 y.o., male     PCP: [name], MD   Reason for Endocrinology Evaluation: Hyperthyroidism     Initial Endocrinology Clinic Visit: 03/15/2020    PATIENT IDENTIFIER: [name] is a 76 y.o., male with a past medical history of HTN, dyslipidemia and OCD. He has followed with [facility] since 03/15/2020 for consultative assistance with management of his Hyperthyroidism      HISTORICAL SUMMARY:  They moved from [facility], [place] in May 2019  He was diagnosed with hypothyroidism ~ 15 yrs ago and was on LT-4  Until 02/03/2019     Over the summer/fall  of 2021 he has noted weight loss as well as decrease muscle mass. His LT-4 replacement has been gradually reduced until fully discontinued  On 02/03/2019   No amiodarone use   Methimazole started 02/2020  TRAB elevated 18.65 iu/L    Thyroid ultrasound showed sub centimeter left nodule that did not meet criteria for follow up 03/2020   No FH of thyroid disease     SUBJECTIVE:     Today (12/27/2021):  [name] is here for Hyperthyroidism.    He recently had a colonoscopy Weight has been stable  Denies local neck swelling  Denies palpitations  Denies loose stools or diarrhea  He is on atanoprost through Dr. Katy Fitch to prevent glaucoma  Denies hand tremors but has tingling of right arm due to old injury     Methimazole 5 mg , half on Sundays and 1 tab rest of the week      HISTORY:  Past Medical History:  Past Medical History:  Diagnosis Date   Anxiety    Arthritis    Attention deficit disorder (ADD) in adult    Adderall '30mg'$  tablet- half tablet twice a day.    Chicken pox    Colon polyp    dad died of colon cancer- q5 year colonoscopy. 2018   Diabetes mellitus without complication ([place])    Diverticulosis    Encounter for blood transfusion    related to overtreatment with PCN. became very ill.     Gall stones    Heart murmur    Sees cardiology October each year due to murmur after fall- get records. Seen for heart murmur heard 2010. We discused may not need to see cardiology. Every other year stres test   Hyperglycemia    a1c 5.9 in 03/2017   Hyperlipidemia    atorvastatin '40mg'$    Hypertension    losartan 50, hctz 12.'5mg'$    Hypothyroidism    levothyroxine 100 mcg 6 days a week as of mid 2019.    Seasonal allergies    prn antihistamine   Past Surgical History:  Past Surgical History:  Procedure Laterality Date   Arthroscopic surgery Left 1986-1996   Damaged cartilage Left knee   BACK SURGERY Bilateral 2008   Injury due to a fall. 2 fractured vertebrae, 3 fractured ribs fracture L scapulla, mild concussion   CHOLECYSTECTOMY  2014   Relocation surgery Right    For right elbow ulna nerve    REPAIR of torn retina Right 2014   TONSILECTOMY, ADENOIDECTOMY, BILATERAL MYRINGOTOMY AND TUBES  1958   vena Laser   2009   Remaoval of varicous veins   Social History:  reports that he has never smoked. He has never used smokeless tobacco. He reports that he does not currently  use alcohol. He reports that he does not use drugs. Family History:  Family History  Problem Relation Age of Onset   Arthritis Mother        passed at age 73   Miscarriages / [place] Mother    Hearing loss Father    Colon cancer Father        died at 76   Heart disease Paternal Grandmother    Other Daughter        diabetes educator   Miscarriages / Stillbirths Daughter    Diabetes Mellitus I Daughter        in [place]   Esophageal cancer Neg Hx    Stomach cancer Neg Hx    Colon polyps Neg Hx    Rectal cancer Neg Hx      HOME MEDICATIONS: Allergies as of 12/27/2021   No Known Allergies      Medication List        Accurate as of December 27, 2021  9:12 AM. If you have any questions, ask your nurse or doctor.          atorvastatin 40 MG tablet Commonly known as: LIPITOR Take 1 tablet (40  mg total) by mouth daily.   CHONDROITIN SULFATE PO Take 200 mg by mouth daily.   CITRACAL + D PO Take 25 mcg by mouth daily.   diclofenac Sodium 1 % Gel Commonly known as: VOLTAREN APPLY 2 GRAMS TO AFFECTED AREAS EVERY NIGHT AT BEDTIME   escitalopram 10 MG tablet Commonly known as: LEXAPRO TAKE ONE TABLET BY MOUTH DAILY   fluticasone 50 MCG/ACT nasal spray Commonly known as: FLONASE Place 1 spray into both nostrils daily.   Glucosamine HCl 1500 MG Tabs Take 1,500 mg by mouth daily. Glucosamine 1005 mg / day, condroitin 134 mg / day, calcium fructoborate 145 mg / day, MSM 1005 mg per day, Hyaluronic acid 2 mg / day   HYALURONIC ACID PO Take 3.3 mg by mouth daily.   hydrochlorothiazide 12.5 MG capsule Commonly known as: MICROZIDE Take 1 capsule (12.5 mg total) by mouth daily.   latanoprost 0.005 % ophthalmic solution Commonly known as: XALATAN Place 1 drop into both eyes at bedtime.   MAGNESIUM PO Take 450 mg by mouth.   metFORMIN 500 MG tablet Commonly known as: GLUCOPHAGE TAKE 1/2 TABLET BY MOUTH DAILY WITH BREAKFAST   methimazole 5 MG tablet Commonly known as: TAPAZOLE Take 1 tablet (5 mg total) by mouth daily. What changed: additional instructions   OMEGA-3 FISH OIL PO Take by mouth. EPA 1380 mg / day, DHA 520 mg / day   OVER THE COUNTER MEDICATION Magtein   OVER THE COUNTER MEDICATION Take 2 capsules by mouth daily at 8 pm. Calcium 240 mg, vitamin D 100 mcg, Magnesium 120 mg   PROBIOTIC ADVANCED PO Take 2 tablets by mouth daily. 30 billion cfu's   Turmeric Curcumin 05-998 MG Caps Take 1 capsule by mouth daily.   valsartan 320 MG tablet Commonly known as: DIOVAN TAKE ONE TABLET BY MOUTH DAILY   vitamin E 180 MG (400 UNITS) capsule Take 800 Units by mouth daily.          OBJECTIVE:   PHYSICAL EXAM: VS: BP 132/78 (BP Location: Left Arm, Patient Position: Sitting, Cuff Size: Large)   Pulse 69   Ht 5' 8.8" (1.748 m)   Wt 176 lb (79.8 kg)    SpO2 98%   BMI 26.14 kg/m    EXAM: General: Pt appears well and is  in NAD  Eyes: External eye exam normal without stare, lid lag or exophthalmos.  EOM intact  Neck: General: Supple without adenopathy. Thyroid: Left thyroid nodule appreciated  Lungs: Clear with good BS bilat with no rales, rhonchi, or wheezes  Heart: Auscultation: RRR.  Abdomen: Normoactive bowel sounds, soft, nontender, without masses or organomegaly palpable  Extremities:  BL LE: No pretibial edema normal ROM and strength.  Mental Status: Judgment, insight: Intact Orientation: Oriented to time, place, and person Mood and affect: No depression, anxiety, or agitation     DATA REVIEWED:   Latest Reference Range & Units 12/27/21 09:30  TSH 0.35 - 5.50 uIU/mL 4.91  T4,Free(Direct) 0.60 - 1.60 ng/dL 0.56 (L)   Results for Darius, Long (MRN [id number]) as of 06/12/2020 09:57  Ref. Range 03/15/2020 12:16  TRAB Latest Ref Range: <=2.00 IU/L 18.65 (H)   Thyroid ULtrasound 04/04/2020    Parenchymal Echotexture: Moderately heterogenous   Isthmus: 0.4 cm   Right lobe: 4.8 x 2.3 x 2.0 cm   Left lobe: 4.1 x 2.2 x 1.7 cm   _________________________________________________________   Estimated total number of nodules >/= 1 cm: 0   Number of spongiform nodules >/=  2 cm not described below (TR1): 0   Number of mixed cystic and solid nodules >/= 1.5 cm not described below (TR2): 0   _________________________________________________________   Nodule # 1:   Location: Left; Superior   Maximum size: 0.7 cm; Other 2 dimensions: 0.6 x 0.5 cm   Composition: solid/almost completely solid (2)   Echogenicity: isoechoic (1)   Shape: not taller-than-wide (0)   Margins: smooth (0)   Echogenic foci: none (0)   ACR TI-RADS total points: 3.   ACR TI-RADS risk category: TR3 (3 points).   ACR TI-RADS recommendations:   Given size (<1.4 cm) and appearance, this nodule does NOT meet TI-RADS criteria for  biopsy or dedicated follow-up.   _________________________________________________________   IMPRESSION: 1. Moderately heterogeneous, normal-sized thyroid gland. 2. Single solid left upper thyroid nodule (labeled 1, 0.7 cm) which does not meet criteria (TI-RADS category 3) for dedicated ultrasound follow-up or tissue sampling.   The above is in keeping with the ACR TI-RADS recommendations - J Am Coll Radiol 2017;14:587-595.   Ruthann Cancer, MD   [facility]   [facility]     ASSESSMENT / PLAN / RECOMMENDATIONS:   Hyperthyroidism:   -Patient with Graves' disease but he also has a left thyroid nodule which could also be contributing to hyperthyroidism -Patient is clinically euthyroid -No local neck symptoms -Historically he has been noted with sensitivity to methimazole changes.  His TSH is elevated and will reduce methimazole with repeat TFTs in 2 months    Medications  Take methimazole 5 mg, 1  tablet Monday through Friday Take methimazole 5 mg, half a tablet on Saturdays None on Sundays    2. Graves' Disease :  -No extrathyroidal manifestations of Graves' disease.  He is up-to-date on eye exams    3. Left Thyroid Nodule:  -No local neck symptoms -This is too small to follow-up  Follow-up in 4 months Labs in 2 weeks   Signed electronically by: Mack Guise, MD  [facility] Endocrinology  [facility] [address] [place],  [address] Phone: [phone] FAX: [phone]      CC: [name], [place] [address] Phone: [phone]  Fax: [phone]   Return to Endocrinology clinic as  below: Future Appointments  Date Time Provider [place]  02/14/2022  8:20 AM [name], MD [facility]  03/18/2022  1:00 PM [facility] HEALTH COACH [facility]  06/03/2022  2:00 PM [facility] CCM PHARMACIST [facility]  10/01/2022   9:15 AM Hayden Pedro, MD [facility] None

## 2021-12-28 LAB — T3: T3, Total: 107 ng/dL (ref 76–181)

## 2021-12-29 ENCOUNTER — Encounter: Payer: Self-pay | Admitting: Family Medicine

## 2021-12-31 ENCOUNTER — Other Ambulatory Visit: Payer: Self-pay

## 2021-12-31 MED ORDER — VALSARTAN 320 MG PO TABS: 320.0000 mg | ORAL_TABLET | Freq: Every day | ORAL | 3 refills | Status: DC

## 2022-01-02 DIAGNOSIS — H2513 Age-related nuclear cataract, bilateral: Secondary | ICD-10-CM | POA: Diagnosis not present

## 2022-01-02 DIAGNOSIS — E119 Type 2 diabetes mellitus without complications: Secondary | ICD-10-CM | POA: Diagnosis not present

## 2022-01-02 DIAGNOSIS — H532 Diplopia: Secondary | ICD-10-CM | POA: Diagnosis not present

## 2022-01-02 DIAGNOSIS — H40013 Open angle with borderline findings, low risk, bilateral: Secondary | ICD-10-CM | POA: Diagnosis not present

## 2022-01-02 DIAGNOSIS — H43813 Vitreous degeneration, bilateral: Secondary | ICD-10-CM | POA: Diagnosis not present

## 2022-01-02 LAB — HM DIABETES EYE EXAM

## 2022-02-04 ENCOUNTER — Encounter: Payer: Self-pay | Admitting: Family Medicine

## 2022-02-14 ENCOUNTER — Encounter: Payer: Self-pay | Admitting: Family Medicine

## 2022-02-14 ENCOUNTER — Ambulatory Visit (INDEPENDENT_AMBULATORY_CARE_PROVIDER_SITE_OTHER): Payer: PPO | Admitting: Family Medicine

## 2022-02-14 VITALS — BP 124/68 | HR 74 | Temp 98.3°F | Ht 68.8 in | Wt 171.0 lb

## 2022-02-14 DIAGNOSIS — I10 Essential (primary) hypertension: Secondary | ICD-10-CM

## 2022-02-14 DIAGNOSIS — E785 Hyperlipidemia, unspecified: Secondary | ICD-10-CM

## 2022-02-14 DIAGNOSIS — R067 Sneezing: Secondary | ICD-10-CM

## 2022-02-14 DIAGNOSIS — R739 Hyperglycemia, unspecified: Secondary | ICD-10-CM | POA: Diagnosis not present

## 2022-02-14 LAB — HEMOGLOBIN A1C: Hgb A1c MFr Bld: 6.1 % (ref 4.6–6.5)

## 2022-02-14 LAB — COMPREHENSIVE METABOLIC PANEL
ALT: 34 U/L (ref 0–53)
AST: 33 U/L (ref 0–37)
Albumin: 4.1 g/dL (ref 3.5–5.2)
Alkaline Phosphatase: 68 U/L (ref 39–117)
BUN: 20 mg/dL (ref 6–23)
CO2: 30 mEq/L (ref 19–32)
Calcium: 9 mg/dL (ref 8.4–10.5)
Chloride: 104 mEq/L (ref 96–112)
Creatinine, Ser: 0.92 mg/dL (ref 0.40–1.50)
GFR: 80.26 mL/min (ref >60.00)
Glucose, Bld: 77 mg/dL (ref 70–99)
Potassium: 4.1 mEq/L (ref 3.5–5.1)
Sodium: 140 mEq/L (ref 135–145)
Total Bilirubin: 0.8 mg/dL (ref 0.2–1.2)
Total Protein: 6.5 g/dL (ref 6.0–8.3)

## 2022-02-14 LAB — LDL CHOLESTEROL, DIRECT: Direct LDL: 75 mg/dL

## 2022-02-14 LAB — POC COVID19 BINAXNOW: SARS Coronavirus 2 Ag: NEGATIVE

## 2022-02-14 NOTE — Progress Notes (Signed)
Phone [phone] In person visit   Subjective:   Darius Long. is a 77 y.o. year old very pleasant male patient who presents for/with See problem oriented charting Chief Complaint  Patient presents with   Follow-up   Hypertension   Hypothyroidism   ADD   Cough    Pt c/o cough and sneezing after traveling to [place].    Past Medical History-  Patient Active Problem List   Diagnosis Date Noted   Hyperthyroidism 03/16/2020    Priority: High   OCD (obsessive compulsive disorder) 01/01/2018    Priority: High   Attention deficit disorder (ADD) in adult     Priority: High   Heart murmur     Priority: High   Nocturia 08/17/2021    Priority: Medium    Arthritis 07/07/2017    Priority: Medium    Hypertension     Priority: Medium    Hyperlipidemia     Priority: Medium    Hyperglycemia     Priority: Medium    Strain of right tibialis anterior muscle 10/30/2017    Priority: Low   Seasonal allergies     Priority: Low   Colon polyp     Priority: Low   Acute viral sinusitis 12/18/2020    Medications- reviewed and updated Current Outpatient Medications  Medication Sig Dispense Refill   atorvastatin (LIPITOR) 40 MG tablet Take 1 tablet (40 mg total) by mouth daily. 90 tablet 3   Black Pepper-Turmeric (TURMERIC CURCUMIN) 05-998 MG CAPS Take 1 capsule by mouth daily.     Calcium Citrate-Vitamin D (CITRACAL + D PO) Take 25 mcg by mouth daily.     CHONDROITIN SULFATE PO Take 200 mg by mouth daily.     diclofenac Sodium (VOLTAREN) 1 % GEL APPLY 2 GRAMS TO AFFECTED AREAS EVERY NIGHT AT BEDTIME 100 g 2   escitalopram (LEXAPRO) 10 MG tablet TAKE ONE TABLET BY MOUTH DAILY 90 tablet 3   fluticasone (FLONASE) 50 MCG/ACT nasal spray Place 1 spray into both nostrils daily. 16 g 1   Glucosamine HCl 1500 MG TABS Take 1,500 mg by mouth daily. Glucosamine 1005 mg / day, condroitin 134 mg / day, calcium fructoborate 145 mg / day, MSM 1005 mg per day, Hyaluronic acid 2 mg / day      Hyaluronic Acid-Vitamin C (HYALURONIC ACID PO) Take 3.3 mg by mouth daily.     latanoprost (XALATAN) 0.005 % ophthalmic solution Place 1 drop into both eyes at bedtime.     MAGNESIUM PO Take 450 mg by mouth.     metFORMIN (GLUCOPHAGE) 500 MG tablet TAKE 1/2 TABLET BY MOUTH DAILY WITH BREAKFAST 45 tablet 3   methimazole (TAPAZOLE) 5 MG tablet Take 1 tablet (5 mg total) by mouth as directed. Take 1 tablet Monday through Friday, half a tablet on Saturday, and none on Sundays 66 tablet 3   Omega-3 Fatty Acids (OMEGA-3 FISH OIL PO) Take by mouth. EPA 1380 mg / day, DHA 520 mg / day     OVER THE COUNTER MEDICATION Magtein     OVER THE COUNTER MEDICATION Take 2 capsules by mouth daily at 8 pm. Calcium 240 mg, vitamin D 100 mcg, Magnesium 120 mg     Probiotic Product (PROBIOTIC ADVANCED PO) Take 2 tablets by mouth daily. 30 billion cfu's     valsartan (DIOVAN) 320 MG tablet Take 1 tablet (320 mg total) by mouth daily. 90 tablet 3   vitamin E 400 UNIT capsule Take 800 Units by  mouth daily.     No current facility-administered medications for this visit.     Objective:  BP 124/68   Pulse 74   Temp 98.3 F (36.8 C)   Ht 5' 8.8" (1.748 m)   Wt 171 lb (77.6 kg)   SpO2 97%   BMI 25.40 kg/m  Gen: NAD, resting comfortably TM normal bilaterally, pharynx with some drainage and mild erythema CV: RRR stable faint murmur Lungs: CTAB no crackles, wheeze, rhonchi Abdomen: soft/nontender/nondistended/normal bowel sounds.  Ext: no edema Skin: warm, dry  Results for orders placed or performed in visit on 02/14/22 (from the past 24 hour(s))  POC COVID-19     Status: None   Collection Time: 02/14/22  8:47 AM  Result Value Ref Range   SARS Coronavirus 2 Ag Negative Negative      Assessment and Plan   #HM- had covid in September- may get updated shot once he feels better from below symptoms- though discussed thought he could wait up to 6 months  # Cough/sneezing S:cough/sneezing started when  traveling to [place] - was there for 5 weeks- started 2.5 weeks ago. Some right ear pressure on flight -loratadine- started 2 days ago -Chlorphenamine at night- makes him sleepy. Some post nasal drip  -both are helping -Did have COVID in September but symptoms cleared from this -clear for most part discharge. No sinus pressure A/P: suspect allergic element- improving on loratadine and if not doing better within a week can layer in flonase in addition to loratadine -if symptoms not improving within 2-3 weeks could consider chest x-ray -should also let me know if develops sinus pressure or increased discharge  -covid negative  # Graves' disease-follows with Dr. Kelton Pillar with history of hypothyroidism S: Medication: Methimazole 5 mg Monday through Friday and half tablet on Saturday and then on Sunday - Also has left thyroid nodule which is being followed clinically but no neck symptoms Lab Results  Component Value Date   TSH 4.91 12/27/2021   A/P: stable- continue current medicines and endocrine follow up- he opted out of labs today as has planned with her    #hypertension S: medication: Valsartan 320 mg  - November 2023 suspended hydrochlorothiazide 12.5 mg due to frequent urination- found helpful Home readings #s: 120s/60s for most part A/P: stable- continue current medicines - stay off hydrochlorothiazide as doing well. He will let us know if bp increases again in the future -tried to decrease intake and lost a few lbs and feels that helped  #hyperlipidemia-lipids above goal despite max dose statin S: Medication:Atorvastatin 40 mg -History of LFT elevation Lab Results  Component Value Date   CHOL 159 08/17/2021   HDL 44.10 08/17/2021   LDLCALC 102 (H) 08/17/2021   LDLDIRECT 56.0 02/05/2021   TRIG 64.0 08/17/2021   CHOLHDL 4 08/17/2021  A/P: LDL last check slightly high- update today with labs- for now continue current medications     # Hyperglycemia/insulin  resistance/prediabetes-A1c 6.22 June 2020 S:  Medication: Metformin 250 mg Lab Results  Component Value Date   HGBA1C 6.1 08/17/2021   HGBA1C 6.0 02/05/2021   HGBA1C 6.2 07/13/2020  A/P: hopefully stable- update a1c today. Continue current meds for now  # Adult ADD-on Adderall in the past but has tolerated more recently being off medication   # OCD-remains on Lexapro 10 mg   # Heart murmur S: seen cardiology each October. Echo from 10/2016 showed mild mitral regurgitation only. Only mild abnormalities on echo 01/02/2018 -As long as murmur  is faint and he has no symptoms he wants to hold off on further cardiology evaluation or echocardiogram - no chest pain or shortness of breath or dizziness  A/P: stable and no sympotms- he wants to monitor  Recommended follow up: Return in about 6 months (around 08/15/2022) for physical or sooner if needed.Schedule b4 you leave. Future Appointments  Date Time Provider [place]  02/27/2022  9:00 AM [facility] END[facility]NEUR[facility] None  03/18/2022  1:00 PM [facility] HEALTH COACH [facility]  05/13/2022 11:30 AM [name], [name], MD [facility] None  06/03/2022  1:00 PM Edythe Clarity, RPH [facility] None  10/01/2022  9:15 AM Hayden Pedro, MD [facility] None   Lab/Order associations:   ICD-10-CM   1. Sneezing  R06.7 POC COVID-19    2. Primary hypertension  I10 Comprehensive metabolic panel    3. Hyperglycemia  R73.9 HgB A1c    4. Hyperlipidemia, unspecified hyperlipidemia type  E78.5 LDL cholesterol, direct     Return precautions advised.  [name], MD

## 2022-02-14 NOTE — Patient Instructions (Addendum)
Let us know if you get new covid shot  Please stop by lab before you go If you have mychart- we will send your results within 3 business days of [place] receiving them.  If you do not have mychart- we will call you about results within 5 business days of [place] receiving them.  *please also note that you will see labs on mychart as soon as they post. I will later go in and write notes on them- will say "notes from Dr. Yong Channel"   suspect allergic element- improving on loratadine and if not doing better within a week can layer in flonase in addition to loratadine -if symptoms not improving within 2-3 weeks could consider chest x-ray -should also let me know if develops sinus pressure or increased discharge  -covid negative  Recommended follow up: Return in about 6 months (around 08/15/2022) for physical or sooner if needed.Schedule b4 you leave.

## 2022-02-14 NOTE — Assessment & Plan Note (Signed)
stable- continue current medicines - stay off hydrochlorothiazide as doing well. He will let us know if bp increases again in the future

## 2022-02-19 ENCOUNTER — Encounter: Payer: Self-pay | Admitting: Family Medicine

## 2022-02-27 ENCOUNTER — Encounter: Payer: Self-pay | Admitting: Internal Medicine

## 2022-02-27 ENCOUNTER — Other Ambulatory Visit (INDEPENDENT_AMBULATORY_CARE_PROVIDER_SITE_OTHER): Payer: PPO

## 2022-02-27 DIAGNOSIS — E0500 Thyrotoxicosis with diffuse goiter without thyrotoxic crisis or storm: Secondary | ICD-10-CM

## 2022-02-27 LAB — T4, FREE: Free T4: 0.58 ng/dL — ABNORMAL LOW (ref 0.60–1.60)

## 2022-02-27 LAB — TSH: TSH: 3.53 u[IU]/mL (ref 0.35–5.50)

## 2022-02-28 LAB — T3: T3, Total: 97 ng/dL (ref 76–181)

## 2022-03-18 ENCOUNTER — Ambulatory Visit (INDEPENDENT_AMBULATORY_CARE_PROVIDER_SITE_OTHER): Payer: PPO

## 2022-03-18 VITALS — Wt 171.0 lb

## 2022-03-18 DIAGNOSIS — Z0000 Encounter for general adult medical examination without abnormal findings: Secondary | ICD-10-CM

## 2022-03-18 NOTE — Progress Notes (Cosign Needed Addendum)
Subjective:   Darius Long. is a 77 y.o. male who presents for Medicare Annual/Subsequent preventive examination.  Review of Systems     Cardiac Risk Factors include: advanced age (>88mn, >>3women);hypertension;dyslipidemia;male gender     Objective:    Today's Vitals   03/18/22 1309  Weight: 171 lb (77.6 kg)   Body mass index is 25.4 kg/m.     03/18/2022    1:23 PM 03/09/2021    1:10 PM 03/03/2020    1:10 PM 12/01/2018   11:54 AM  Advanced Directives  Does Patient Have a Medical Advance Directive? Yes Yes Yes Yes  Type of [place] Healthcare Power of [place] Does patient want to make changes to medical advance directive? No - Patient declined   No - Patient declined  Copy of [place] Chart? Yes - validated most recent copy scanned in chart (See row information) Yes - validated most recent copy scanned in chart (See row information) Yes - validated most recent copy scanned in chart (See row information) Yes - validated most recent copy scanned in chart (See row information)    Current Medications (verified) Outpatient Encounter Medications as of 03/18/2022  Medication Sig   atorvastatin (LIPITOR) 40 MG tablet Take 1 tablet (40 mg total) by mouth daily.   Black Pepper-Turmeric (TURMERIC CURCUMIN) 05-998 MG CAPS Take 1 capsule by mouth daily.   Calcium Citrate-Vitamin D (CITRACAL + D PO) Take 25 mcg by mouth daily.   CHONDROITIN SULFATE PO Take 200 mg by mouth daily.   diclofenac Sodium (VOLTAREN) 1 % GEL APPLY 2 GRAMS TO AFFECTED AREAS EVERY NIGHT AT BEDTIME   escitalopram (LEXAPRO) 10 MG tablet TAKE ONE TABLET BY MOUTH DAILY   Glucosamine HCl 1500 MG TABS Take 1,500 mg by mouth daily. Glucosamine 1005 mg / day, condroitin 134 mg / day, calcium fructoborate 145 mg / day, MSM 1005 mg per day, Hyaluronic acid 2 mg / day    latanoprost (XALATAN) 0.005 % ophthalmic solution Place 1 drop into both eyes at bedtime.   MAGNESIUM PO Take 450 mg by mouth.   metFORMIN (GLUCOPHAGE) 500 MG tablet TAKE 1/2 TABLET BY MOUTH DAILY WITH BREAKFAST   methimazole (TAPAZOLE) 5 MG tablet Take 1 tablet (5 mg total) by mouth as directed. Take 1 tablet Monday through Friday, half a tablet on Saturday, and none on Sundays   Omega-3 Fatty Acids (OMEGA-3 FISH OIL PO) Take by mouth. EPA 1380 mg / day, DHA 520 mg / day   OVER THE COUNTER MEDICATION Take 2 capsules by mouth daily at 8 pm. Calcium 240 mg, vitamin D 100 mcg, Magnesium 120 mg   Probiotic Product (PROBIOTIC ADVANCED PO) Take 2 tablets by mouth daily. 30 billion cfu's   valsartan (DIOVAN) 320 MG tablet Take 1 tablet (320 mg total) by mouth daily.   vitamin E 400 UNIT capsule Take 800 Units by mouth daily.   [DISCONTINUED] fluticasone (FLONASE) 50 MCG/ACT nasal spray Place 1 spray into both nostrils daily.   [DISCONTINUED] Hyaluronic Acid-Vitamin C (HYALURONIC ACID PO) Take 3.3 mg by mouth daily.   [DISCONTINUED] OVER THE COUNTER MEDICATION Magtein   No facility-administered encounter medications on file as of 03/18/2022.    Allergies (verified) Patient has no known allergies.   History: Past Medical History:  Diagnosis Date   Anxiety    Arthritis    Attention deficit disorder (ADD)  in adult    Adderall '30mg'$  tablet- half tablet twice a day.    Chicken pox    Colon polyp    dad died of colon cancer- q5 year colonoscopy. 2018   Diabetes mellitus without complication ([place])    Diverticulosis    Encounter for blood transfusion    related to overtreatment with PCN. became very ill.    Gall stones    Heart murmur    Sees cardiology October each year due to murmur after fall- get records. Seen for heart murmur heard 2010. We discused may not need to see cardiology. Every other year stres test   Hyperglycemia    a1c 5.9 in 03/2017   Hyperlipidemia    atorvastatin '40mg'$     Hypertension    losartan 50, hctz 12.'5mg'$    Hypothyroidism    levothyroxine 100 mcg 6 days a week as of mid 2019.    Seasonal allergies    prn antihistamine   Past Surgical History:  Procedure Laterality Date   Arthroscopic surgery Left 1986-1996   Damaged cartilage Left knee   BACK SURGERY Bilateral 2008   Injury due to a fall. 2 fractured vertebrae, 3 fractured ribs fracture L scapulla, mild concussion   CHOLECYSTECTOMY  2014   Relocation surgery Right    For right elbow ulna nerve    REPAIR of torn retina Right 2014   TONSILECTOMY, ADENOIDECTOMY, BILATERAL MYRINGOTOMY AND TUBES  1958   vena Laser   2009   Remaoval of varicous veins   Family History  Problem Relation Age of Onset   Arthritis Mother        passed at age 37   Miscarriages / [place] Mother    Hearing loss Father    Colon cancer Father        died at 49   Heart disease Paternal Grandmother    Other Daughter        diabetes educator   Miscarriages / Stillbirths Daughter    Diabetes Mellitus I Daughter        in [place]   Esophageal cancer Neg Hx    Stomach cancer Neg Hx    Colon polyps Neg Hx    Rectal cancer Neg Hx    Social History   Socioeconomic History   Marital status: Married    Spouse name: Not on file   Number of children: 2   Years of education: Not on file   Highest education level: Not on file  Occupational History   Occupation: Retired   Tobacco Use   Smoking status: Never   Smokeless tobacco: Never  Vaping Use   Vaping Use: Never used  Substance and Sexual Activity   Alcohol use: Not Currently   Drug use: Never   Sexual activity: Not Currently  Other Topics Concern   Not on file  Social History Narrative   Married. Twin daughters. 4 grandkids (2 each)   Moved from [place], [place]      Retired from Conservation officer, historic buildings      Hobbies: time with grandkids, enjoys travel   Social Determinants of Radio broadcast assistant Strain: [place]  (03/18/2022)    Overall Financial Resource Strain (CARDIA)    Difficulty of Paying Living Expenses: Not hard at all  Food Insecurity: No Food Insecurity (03/18/2022)   Hunger Vital Sign    Worried About Running Out of Food in the Last Year: Never true    [place] in  the Last Year: Never true  Transportation Needs: No Transportation Needs (03/18/2022)   PRAPARE - Hydrologist (Medical): No    Lack of Transportation (Non-Medical): No  Physical Activity: Sufficiently Active (03/18/2022)   Exercise Vital Sign    Days of Exercise per Week: 2 days    Minutes of Exercise per Session: 90 min  Stress: No Stress Concern Present (03/18/2022)   [place]    Feeling of Stress : Not at all  Social Connections: Moderately Integrated (03/18/2022)   Social Connection and Isolation Panel [NHANES]    Frequency of Communication with Friends and Family: More than three times a week    Frequency of Social Gatherings with Friends and Family: More than three times a week    Attends Religious Services: More than 4 times per year    Active Member of [employer] or Organizations: No    Attends Music therapist: Never    Marital Status: Married    Tobacco Counseling Counseling given: Not Answered   Clinical Intake:  Pre-visit preparation completed: Yes  Pain : No/denies pain     BMI - recorded: 25.4 Nutritional Status: BMI 25 -29 Overweight Nutritional Risks: None Diabetes: No  How often do you need to have someone help you when you read instructions, pamphlets, or other written materials from your doctor or pharmacy?: 1 - Never  Diabetic?no  Interpreter Needed?: No  Information entered by :: Charlott Rakes, LPN   Activities of Daily Living    03/18/2022    1:24 PM  In your present state of health, do you have any difficulty performing the following activities:  Hearing? 0  Vision? 0  Difficulty  concentrating or making decisions? 0  Walking or climbing stairs? 0  Dressing or bathing? 0  Doing errands, shopping? 0  Preparing Food and eating ? N  Using the Toilet? N  In the past six months, have you accidently leaked urine? N  Do you have problems with loss of bowel control? N  Managing your Medications? N  Managing your Finances? N  Housekeeping or managing your Housekeeping? N    Patient Care Team: [name], MD as PCP - General (Family Medicine) [facility] as Consulting Physician Croitoru, Dani Gobble, MD as Consulting Physician (Cardiology) Hayden Pedro, MD as Consulting Physician (Ophthalmology) Edythe Clarity, [facility] (Pharmacist)  Indicate any recent Medical Services you may have received from other than [facility] providers in the past year (date may be approximate).     Assessment:   This is a routine wellness examination for Darius Long.  Hearing/Vision screen Hearing Screening - Comments:: Pt denies any hearing issues  Vision Screening - Comments:: Pt follow up with Dr Katy Fitch for annual eye exams   Dietary issues and exercise activities discussed: Current Exercise Habits: Home exercise routine, Type of exercise: walking;Other - see comments, Time (Minutes): > 60, Frequency (Times/Week): 2, Weekly Exercise (Minutes/Week): 0   Goals Addressed             This Visit's Progress    Patient Stated       Get A1c down to 5 range        Depression Screen    03/18/2022    1:22 PM 03/09/2021    1:08 PM 07/13/2020    3:55 PM 03/03/2020    1:08 PM 02/02/2020    9:53 AM 07/09/2019    4:56 PM 02/05/2018  10:34 AM  PHQ 2/9 Scores  PHQ - 2 Score 0 0 0 0 0 0 1  PHQ- 9 Score       3    Fall Risk    03/18/2022    1:24 PM 03/09/2021    1:11 PM 12/18/2020    3:44 PM 07/13/2020    3:55 PM 03/03/2020    1:12 PM  Fall Risk   Falls in the past year? 0 0 0 0 0  Number falls in past yr: 0 0  0 0  Injury with Fall? 0 0  0 0  Risk for fall due to :  Impaired vision Impaired vision  No Fall Risks Impaired vision  Follow up Falls prevention discussed Falls prevention discussed  Falls evaluation completed Falls prevention discussed    FALL RISK PREVENTION PERTAINING TO THE HOME:  Any stairs in or around the home? Yes  If so, are there any without handrails? No  Home free of loose throw rugs in walkways, pet beds, electrical cords, etc? Yes  Adequate lighting in your home to reduce risk of falls? Yes   ASSISTIVE DEVICES UTILIZED TO PREVENT FALLS:  Life alert? No  Use of a cane, walker or w/c? No  Grab bars in the bathroom? Yes  Shower chair or bench in shower? Yes  Elevated toilet seat or a handicapped toilet? No   TIMED UP AND GO:  Was the test performed? No .   Cognitive Function:        03/18/2022    1:25 PM 03/09/2021    1:12 PM 03/03/2020    1:15 PM 12/01/2018    1:49 PM  6CIT Screen  What Year? 0 points 0 points 0 points 0 points  What month? 0 points 0 points 0 points 0 points  What time? 0 points 0 points  0 points  Count back from 20 0 points 0 points 0 points 0 points  Months in reverse 0 points 0 points 0 points 0 points  Repeat phrase 0 points 0 points 0 points 0 points  Total Score 0 points 0 points  0 points    Immunizations Immunization History  Administered Date(s) Administered   Covid-19, Mrna,Vaccine(Spikevax)3yr and older 02/19/2022   Fluad Quad(high Dose 65+) 09/22/2018, 10/13/2019   Influenza, High Dose Seasonal PF 10/10/2017, 10/27/2020, 10/12/2021   MMR 04/14/2013   Moderna Covid-19 Vaccine Bivalent Booster 145yr& up 05/18/2020   Moderna SARS-COV2 Booster Vaccination 05/18/2020   PFIZER(Purple Top)SARS-COV-2 Vaccination 02/28/2019, 03/25/2019, 10/22/2019, 11/13/2020   PNEUMOCOCCAL CONJUGATE-20 07/13/2020   Pneumococcal Conjugate-13 07/01/2013   Pneumococcal Polysaccharide-23 01/03/2012   Respiratory Syncytial Virus Vaccine,Recomb Aduvanted(Arexvy) 10/19/2021   Tdap 05/17/2013   Zoster  Recombinat (Shingrix) 10/12/2017, 02/17/2018   Zoster, Live 08/02/2010    TDAP status: Up to date  Flu Vaccine status: Up to date  Pneumococcal vaccine status: Up to date  Covid-19 vaccine status: Completed vaccines  Qualifies for Shingles Vaccine? Yes   Zostavax completed Yes   Shingrix Completed?: Yes  Screening Tests Health Maintenance  Topic Date Due   Medicare Annual Wellness (AWV)  03/19/2023   DTaP/Tdap/Td (2 - Td or Tdap) 05/18/2023   Pneumonia Vaccine 6542Years old  Completed   INFLUENZA VACCINE  Completed   COVID-19 Vaccine  Completed   Hepatitis C Screening  Completed   Zoster Vaccines- Shingrix  Completed   HPV VACCINES  Aged Out   COLONOSCOPY (Pts [id number] coverage will need to be confirmed)  Discontinued  Health Maintenance  There are no preventive care reminders to display for this patient.   Colorectal cancer screening: No longer required.    Additional Screening:  Hepatitis C Screening:  Completed 12/28/19  Vision Screening: Recommended annual ophthalmology exams for early detection of glaucoma and other disorders of the eye. Is the patient up to date with their annual eye exam?  Yes  Who is the provider or what is the name of the office in which the patient attends annual eye exams? Dr Katy Fitch  If pt is not established with a provider, would they like to be referred to a provider to establish care? No .   Dental Screening: Recommended annual dental exams for proper oral hygiene  Community Resource Referral / Chronic Care Management: CRR required this visit?  No   CCM required this visit?  No      Plan:     I have personally reviewed and noted the following in the patient's chart:   Medical and social history Use of alcohol, tobacco or illicit drugs  Current medications and supplements including opioid prescriptions. Patient is not currently taking opioid prescriptions. Functional ability and status Nutritional  status Physical activity Advanced directives List of other physicians Hospitalizations, surgeries, and ER visits in previous 12 months Vitals Screenings to include cognitive, depression, and falls Referrals and appointments  In addition, I have reviewed and discussed with patient certain preventive protocols, quality metrics, and best practice recommendations. A written personalized care plan for preventive services as well as general preventive health recommendations were provided to patient.     Willette Brace, LPN   [id number]   Nurse Notes: none

## 2022-03-18 NOTE — Patient Instructions (Signed)
[name] , Thank you for taking time to come for your Medicare Wellness Visit. I appreciate your ongoing commitment to your health goals. Please review the following plan we discussed and let me know if I can assist you in the future.   These are the goals we discussed:  Goals      Patient Stated     Maintain Blood pressure and thyroid     Patient Stated     None at this time      Patient Stated     Get A1c down to 5 range      Track and Manage My Blood Pressure-Hypertension     Timeframe:  Long-Range Goal Priority:  High Start Date:  02/28/2020            Expected End Date: 08/27/2020  Follow Up Date 08/27/2020    - check blood pressure daily - write blood pressure results in a log or diary    Why is this important?   You won't feel high blood pressure, but it can still hurt your blood vessels.  High blood pressure can cause heart or kidney problems. It can also cause a stroke.  Making lifestyle changes like losing a little weight or eating less salt will help.  Checking your blood pressure at home and at different times of the day can help to control blood pressure.  If the doctor prescribes medicine remember to take it the way the doctor ordered.  Call the office if you cannot afford the medicine or if there are questions about it.     Notes:         This is a list of the screening recommended for you and due dates:  Health Maintenance  Topic Date Due   Medicare Annual Wellness Visit  03/19/2023   DTaP/Tdap/Td vaccine (2 - Td or Tdap) 05/18/2023   Pneumonia Vaccine  Completed   Flu Shot  Completed   COVID-19 Vaccine  Completed   Hepatitis C Screening: USPSTF Recommendation to screen - Ages 78-79 yo.  Completed   Zoster (Shingles) Vaccine  Completed   HPV Vaccine  Aged Out   Colon Cancer Screening  Discontinued    Advanced directives: copies in chart   Conditions/risks identified: get A1C Down to 5   Next appointment: Follow up in one year for your annual  wellness visit.   Preventive Care 78 Years and Older, Male  Preventive care refers to lifestyle choices and visits with your health care provider that can promote health and wellness. What does preventive care include? A yearly physical exam. This is also called an annual well check. Dental exams once or twice a year. Routine eye exams. Ask your health care provider how often you should have your eyes checked. Personal lifestyle choices, including: Daily care of your teeth and gums. Regular physical activity. Eating a healthy diet. Avoiding tobacco and drug use. Limiting alcohol use. Practicing safe sex. Taking low doses of aspirin every day. Taking vitamin and mineral supplements as recommended by your health care provider. What happens during an annual well check? The services and screenings done by your health care provider during your annual well check will depend on your age, overall health, lifestyle risk factors, and family history of disease. Counseling  Your health care provider may ask you questions about your: Alcohol use. Tobacco use. Drug use. Emotional well-being. Home and relationship well-being. Sexual activity. Eating habits. History of falls. Memory and ability to understand (cognition). Work and  work environment. Screening  You may have the following tests or measurements: Height, weight, and BMI. Blood pressure. Lipid and cholesterol levels. These may be checked every 5 years, or more frequently if you are over 45 years old. Skin check. Lung cancer screening. You may have this screening every year starting at age 48 if you have a 30-pack-year history of smoking and currently smoke or have quit within the past 15 years. Fecal occult blood test (FOBT) of the stool. You may have this test every year starting at age 46. Flexible sigmoidoscopy or colonoscopy. You may have a sigmoidoscopy every 5 years or a colonoscopy every 10 years starting at age 22. Prostate  cancer screening. Recommendations will vary depending on your family history and other risks. Hepatitis C blood test. Hepatitis B blood test. Sexually transmitted disease (STD) testing. Diabetes screening. This is done by checking your blood sugar (glucose) after you have not eaten for a while (fasting). You may have this done every 1-3 years. Abdominal aortic aneurysm (AAA) screening. You may need this if you are a current or former smoker. Osteoporosis. You may be screened starting at age 5 if you are at high risk. Talk with your health care provider about your test results, treatment options, and if necessary, the need for more tests. Vaccines  Your health care provider may recommend certain vaccines, such as: Influenza vaccine. This is recommended every year. Tetanus, diphtheria, and acellular pertussis (Tdap, Td) vaccine. You may need a Td booster every 10 years. Zoster vaccine. You may need this after age 66. Pneumococcal 13-valent conjugate (PCV13) vaccine. One dose is recommended after age 38. Pneumococcal polysaccharide (PPSV23) vaccine. One dose is recommended after age 13. Talk to your health care provider about which screenings and vaccines you need and how often you need them. This information is not intended to replace advice given to you by your health care provider. Make sure you discuss any questions you have with your health care provider. Document Released: 02/03/2015 Document Revised: 09/27/2015 Document Reviewed: 11/08/2014 Elsevier Interactive Patient Education  2017 [employer] Prevention in the Home Falls can cause injuries. They can happen to people of all ages. There are many things you can do to make your home safe and to help prevent falls. What can I do on the outside of my home? Regularly fix the edges of walkways and driveways and fix any cracks. Remove anything that might make you trip as you walk through a door, such as a raised step or  threshold. Trim any bushes or trees on the path to your home. Use bright outdoor lighting. Clear any walking paths of anything that might make someone trip, such as rocks or tools. Regularly check to see if handrails are loose or broken. Make sure that both sides of any steps have handrails. Any raised decks and porches should have guardrails on the edges. Have any leaves, snow, or ice cleared regularly. Use sand or salt on walking paths during winter. Clean up any spills in your garage right away. This includes oil or grease spills. What can I do in the bathroom? Use night lights. Install grab bars by the toilet and in the tub and shower. Do not use towel bars as grab bars. Use non-skid mats or decals in the tub or shower. If you need to sit down in the shower, use a plastic, non-slip stool. Keep the floor dry. Clean up any water that spills on the floor as soon as it happens.  Remove soap buildup in the tub or shower regularly. Attach bath mats securely with double-sided non-slip rug tape. Do not have throw rugs and other things on the floor that can make you trip. What can I do in the bedroom? Use night lights. Make sure that you have a light by your bed that is easy to reach. Do not use any sheets or blankets that are too big for your bed. They should not hang down onto the floor. Have a firm chair that has side arms. You can use this for support while you get dressed. Do not have throw rugs and other things on the floor that can make you trip. What can I do in the kitchen? Clean up any spills right away. Avoid walking on wet floors. Keep items that you use a lot in easy-to-reach places. If you need to reach something above you, use a strong step stool that has a grab bar. Keep electrical cords out of the way. Do not use floor polish or wax that makes floors slippery. If you must use wax, use non-skid floor wax. Do not have throw rugs and other things on the floor that can make you  trip. What can I do with my stairs? Do not leave any items on the stairs. Make sure that there are handrails on both sides of the stairs and use them. Fix handrails that are broken or loose. Make sure that handrails are as long as the stairways. Check any carpeting to make sure that it is firmly attached to the stairs. Fix any carpet that is loose or worn. Avoid having throw rugs at the top or bottom of the stairs. If you do have throw rugs, attach them to the floor with carpet tape. Make sure that you have a light switch at the top of the stairs and the bottom of the stairs. If you do not have them, ask someone to add them for you. What else can I do to help prevent falls? Wear shoes that: Do not have high heels. Have rubber bottoms. Are comfortable and fit you well. Are closed at the toe. Do not wear sandals. If you use a stepladder: Make sure that it is fully opened. Do not climb a closed stepladder. Make sure that both sides of the stepladder are locked into place. Ask someone to hold it for you, if possible. Clearly mark and make sure that you can see: Any grab bars or handrails. First and last steps. Where the edge of each step is. Use tools that help you move around (mobility aids) if they are needed. These include: Canes. Walkers. Scooters. Crutches. Turn on the lights when you go into a dark area. Replace any light bulbs as soon as they burn out. Set up your furniture so you have a clear path. Avoid moving your furniture around. If any of your floors are uneven, fix them. If there are any pets around you, be aware of where they are. Review your medicines with your doctor. Some medicines can make you feel dizzy. This can increase your chance of falling. Ask your doctor what other things that you can do to help prevent falls. This information is not intended to replace advice given to you by your health care provider. Make sure you discuss any questions you have with your  health care provider. Document Released: 11/03/2008 Document Revised: 06/15/2015 Document Reviewed: 02/11/2014 Elsevier Interactive Patient Education  2017 [employer].

## 2022-03-24 ENCOUNTER — Encounter: Payer: Self-pay | Admitting: Family Medicine

## 2022-03-25 ENCOUNTER — Other Ambulatory Visit: Payer: Self-pay

## 2022-03-25 MED ORDER — ESCITALOPRAM OXALATE 10 MG PO TABS: 10.0000 mg | ORAL_TABLET | Freq: Every day | ORAL | 3 refills | Status: DC

## 2022-05-13 ENCOUNTER — Encounter: Payer: Self-pay | Admitting: Internal Medicine

## 2022-05-13 ENCOUNTER — Ambulatory Visit: Payer: PPO | Admitting: Internal Medicine

## 2022-05-13 VITALS — BP 134/80 | HR 65 | Ht 68.8 in | Wt 169.0 lb

## 2022-05-13 DIAGNOSIS — E0590 Thyrotoxicosis, unspecified without thyrotoxic crisis or storm: Secondary | ICD-10-CM

## 2022-05-13 DIAGNOSIS — E0500 Thyrotoxicosis with diffuse goiter without thyrotoxic crisis or storm: Secondary | ICD-10-CM

## 2022-05-13 LAB — T4, FREE: Free T4: 0.7 ng/dL (ref 0.60–1.60)

## 2022-05-13 LAB — TSH: TSH: 2.91 u[IU]/mL (ref 0.35–5.50)

## 2022-05-13 NOTE — Progress Notes (Unsigned)
Name: Darius Long.  MRN/ DOB: [id number], 1945-03-19    Age/ Sex: 77 y.o., male     PCP: Shelva Majestic, MD   Reason for Endocrinology Evaluation: Hyperthyroidism     Initial Endocrinology Clinic Visit: 03/15/2020    PATIENT IDENTIFIER: [name] is a 77 y.o., male with a past medical history of HTN, dyslipidemia and OCD. He has followed with [facility] since 03/15/2020 for consultative assistance with management of his Hyperthyroidism      HISTORICAL SUMMARY:  They moved from [facility], [place] in May 2019  He was diagnosed with hypothyroidism ~ 15 yrs ago and was on LT-4  Until 02/03/2019     Over the summer/fall  of 2021 he has noted weight loss as well as decrease muscle mass. His LT-4 replacement has been gradually reduced until fully discontinued  On 02/03/2019   No amiodarone use   Methimazole started 02/2020  TRAB elevated 18.65 iu/L    Thyroid ultrasound showed sub centimeter left nodule that did not meet criteria for follow up 03/2020   No FH of thyroid disease     SUBJECTIVE:     Today (05/14/2022):  Darius Long is here for Hyperthyroidism.    Weight overall stable  Denies local neck swelling  Denies palpitations  Denies diarrhea  He is on atanoprost through Dr. Dione Booze to prevent glaucoma  Denies nausea or vomiting    Methimazole 5 mg ,1 tablet  Monday through Friday (none on Saturday and Sunday)    HISTORY:  Past Medical History:  Past Medical History:  Diagnosis Date   Anxiety    Arthritis    Attention deficit disorder (ADD) in adult    Adderall 30mg  tablet- half tablet twice a day.    Chicken pox    Colon polyp    dad died of colon cancer- q5 year colonoscopy. 2018   Diabetes mellitus without complication    Diverticulosis    Encounter for blood transfusion    related to overtreatment with PCN. became very ill.    Gall stones    Heart murmur    Sees cardiology October each year due to murmur  after fall- get records. Seen for heart murmur heard 2010. We discused may not need to see cardiology. Every other year stres test   Hyperglycemia    a1c 5.9 in 03/2017   Hyperlipidemia    atorvastatin 40mg    Hypertension    losartan 50, hctz 12.5mg    Hypothyroidism    levothyroxine 100 mcg 6 days a week as of mid 2019.    Seasonal allergies    prn antihistamine   Past Surgical History:  Past Surgical History:  Procedure Laterality Date   Arthroscopic surgery Left 1986-1996   Damaged cartilage Left knee   BACK SURGERY Bilateral 2008   Injury due to a fall. 2 fractured vertebrae, 3 fractured ribs fracture L scapulla, mild concussion   CHOLECYSTECTOMY  2014   Relocation surgery Right    For right elbow ulna nerve    REPAIR of torn retina Right 2014   TONSILECTOMY, ADENOIDECTOMY, BILATERAL MYRINGOTOMY AND TUBES  1958   vena Laser   2009   Remaoval of varicous veins   Social History:  reports that he has never smoked. He has never used smokeless tobacco. He reports that he does not currently use alcohol. He reports that he does not use drugs. Family History:  Family History  Problem Relation Age of Onset  Arthritis Mother        passed at age 46   Miscarriages / [place] Mother    Hearing loss Father    Colon cancer Father        died at 3   Heart disease Paternal Grandmother    Other Daughter        diabetes educator   Miscarriages / [place] Daughter    Diabetes Mellitus I Daughter        in [place]   Esophageal cancer Neg Hx    Stomach cancer Neg Hx    Colon polyps Neg Hx    Rectal cancer Neg Hx      HOME MEDICATIONS: Allergies as of 05/13/2022   No Known Allergies      Medication List        Accurate as of May 13, 2022 11:59 PM. If you have any questions, ask your nurse or doctor.          atorvastatin 40 MG tablet Commonly known as: LIPITOR Take 1 tablet (40 mg total) by mouth daily.   CHONDROITIN SULFATE PO Take 200 mg by mouth  daily.   CITRACAL + D PO Take 25 mcg by mouth daily.   diclofenac Sodium 1 % Gel Commonly known as: VOLTAREN APPLY 2 GRAMS TO AFFECTED AREAS EVERY NIGHT AT BEDTIME   escitalopram 10 MG tablet Commonly known as: LEXAPRO Take 1 tablet (10 mg total) by mouth daily.   Glucosamine HCl 1500 MG Tabs Take 1,500 mg by mouth daily. Glucosamine 1005 mg / day, condroitin 134 mg / day, calcium fructoborate 145 mg / day, MSM 1005 mg per day, Hyaluronic acid 2 mg / day   latanoprost 0.005 % ophthalmic solution Commonly known as: XALATAN Place 1 drop into both eyes at bedtime.   MAGNESIUM PO Take 450 mg by mouth.   metFORMIN 500 MG tablet Commonly known as: GLUCOPHAGE TAKE 1/2 TABLET BY MOUTH DAILY WITH BREAKFAST   methimazole 5 MG tablet Commonly known as: TAPAZOLE Take 1 tablet (5 mg total) by mouth as directed. Take 1 tablet Monday through Friday, half a tablet on Saturday, and none on Sundays What changed: additional instructions   OMEGA-3 FISH OIL PO Take by mouth. EPA 1380 mg / day, DHA 520 mg / day   OVER THE COUNTER MEDICATION Take 2 capsules by mouth daily at 8 pm. Calcium 240 mg, vitamin D 100 mcg, Magnesium 120 mg   PROBIOTIC ADVANCED PO Take 2 tablets by mouth daily. 30 billion cfu's   Turmeric Curcumin 05-998 MG Caps Take 1 capsule by mouth daily.   valsartan 320 MG tablet Commonly known as: DIOVAN Take 1 tablet (320 mg total) by mouth daily.   vitamin E 180 MG (400 UNITS) capsule Take 800 Units by mouth daily.          OBJECTIVE:   PHYSICAL EXAM: VS: BP 134/80 (BP Location: Left Arm, Patient Position: Sitting, Cuff Size: Small)   Pulse 65   Ht 5' 8.8" (1.748 m)   Wt 169 lb (76.7 kg)   SpO2 98%   BMI 25.10 kg/m    EXAM: General: Pt appears well and is in NAD  Eyes: External eye exam normal without stare, lid lag or exophthalmos.  EOM intact  Neck: General: Supple without adenopathy. Thyroid: Left thyroid nodule appreciated  Lungs: Clear with good  BS bilat with no rales, rhonchi, or wheezes  Heart: Auscultation: RRR.  Abdomen: Normoactive bowel sounds, soft, nontender, without masses or organomegaly palpable  Extremities:  BL LE: No pretibial edema normal ROM and strength.  Mental Status: Judgment, insight: Intact Orientation: Oriented to time, place, and person Mood and affect: No depression, anxiety, or agitation     DATA REVIEWED:  Latest Reference Range & Units 05/13/22 11:46  TSH 0.35 - 5.50 uIU/mL 2.91  T4,Free(Direct) 0.60 - 1.60 ng/dL 1.61    Results for Darius Long, Darius Long (MRN [id number]) as of 06/12/2020 09:57  Ref. Range 03/15/2020 12:16  TRAB Latest Ref Range: <=2.00 IU/L 18.65 (H)   Thyroid ULtrasound 04/04/2020    Parenchymal Echotexture: Moderately heterogenous   Isthmus: 0.4 cm   Right lobe: 4.8 x 2.3 x 2.0 cm   Left lobe: 4.1 x 2.2 x 1.7 cm   _________________________________________________________   Estimated total number of nodules >/= 1 cm: 0   Number of spongiform nodules >/=  2 cm not described below (TR1): 0   Number of mixed cystic and solid nodules >/= 1.5 cm not described below (TR2): 0   _________________________________________________________   Nodule # 1:   Location: Left; Superior   Maximum size: 0.7 cm; Other 2 dimensions: 0.6 x 0.5 cm   Composition: solid/almost completely solid (2)   Echogenicity: isoechoic (1)   Shape: not taller-than-wide (0)   Margins: smooth (0)   Echogenic foci: none (0)   ACR TI-RADS total points: 3.   ACR TI-RADS risk category: TR3 (3 points).   ACR TI-RADS recommendations:   Given size (<1.4 cm) and appearance, this nodule does NOT meet TI-RADS criteria for biopsy or dedicated follow-up.   _________________________________________________________   IMPRESSION: 1. Moderately heterogeneous, normal-sized thyroid gland. 2. Single solid left upper thyroid nodule (labeled 1, 0.7 cm) which does not meet criteria (TI-RADS category 3)  for dedicated ultrasound follow-up or tissue sampling.   The above is in keeping with the ACR TI-RADS recommendations - J Am Coll Radiol 2017;14:587-595.   Marliss Coots, MD   [facility]   [facility]     ASSESSMENT / PLAN / RECOMMENDATIONS:   Hyperthyroidism:   -Patient with Graves' disease but he also has a left thyroid nodule which could also be contributing to hyperthyroidism -Patient is clinically euthyroid -No local neck symptoms -Historically he has been noted with sensitivity to methimazole changes. -TFTs are within normal range, free T4 increased from 0.58 to 0.7 ng/dL, will decrease the methimazole by half a tablet, no changes at this time    Medications  Continue methimazole 5 mg, 1  tablet Monday through Friday (none on Saturday and Sunday)    2. Graves' Disease :  -No extrathyroidal manifestations of Graves' disease.  He is up-to-date on eye exams    3. Left Thyroid Nodule:  -No local neck symptoms -This is too small to follow-up  Follow-up in 6 months Labs in 3 months   Signed electronically by: Lyndle Herrlich, MD  [facility]  [facility] [address] [place], [place] [address] Phone: [phone] FAX: [phone]      CC: Shelva Majestic, MD [address] [place] [address] Phone: [phone]  Fax: [phone]   Return to Endocrinology clinic as below: Future Appointments  Date Time Provider Department Center  06/03/2022  1:00 PM Erroll Luna, [facility] None  08/27/2022  9:00 AM Shelva Majestic, MD [facility]  08/28/2022 10:00 AM [facility] None  10/01/2022  9:15 AM Sherrie George, MD [facility] None  11/12/2022 10:30 AM  Solymar Grace, Konrad Dolores, MD [facility] None  03/31/2023  1:30 PM [facility] ANNUAL WELLNESS VISIT 1 [facility]

## 2022-05-14 MED ORDER — METHIMAZOLE 5 MG PO TABS: 5.0000 mg | ORAL_TABLET | ORAL | 3 refills | Status: DC

## 2022-05-16 ENCOUNTER — Encounter: Payer: Self-pay | Admitting: Family Medicine

## 2022-05-20 ENCOUNTER — Other Ambulatory Visit: Payer: Self-pay

## 2022-05-20 MED ORDER — DICLOFENAC SODIUM 1 % EX GEL: CUTANEOUS | 3 refills | Status: DC

## 2022-05-23 ENCOUNTER — Encounter: Payer: Self-pay | Admitting: Family Medicine

## 2022-05-28 ENCOUNTER — Encounter: Payer: Self-pay | Admitting: Family Medicine

## 2022-05-28 ENCOUNTER — Other Ambulatory Visit: Payer: Self-pay

## 2022-05-28 MED ORDER — METFORMIN HCL 500 MG PO TABS: ORAL_TABLET | ORAL | 0 refills | Status: DC

## 2022-05-31 ENCOUNTER — Telehealth: Payer: Self-pay | Admitting: Pharmacist

## 2022-05-31 NOTE — Progress Notes (Signed)
Care Management & Coordination Services Pharmacy Team  Reason for Encounter: Appointment Reminder  Contacted patient to confirm telephone appointment with Erskine Emery, PharmD on 06/03/2022 at 1:30 pm. Spoke with patient on 05/31/2022    Star Rating Drugs:  Atorvastatin 40 mg last filled 04/01/2022 90 DS Metformin 500 mg last filled 05/28/2022 90 DS Valsartan 320 mg last filled 03/27/2022 90 DS   Care Gaps: Annual wellness visit in last year? Yes   Future Appointments  Date Time Provider Department Center  06/03/2022  1:30 PM Erroll Luna, [facility] None  08/28/2022 10:00 AM LB ENDO/NEURO LAB [facility] None  08/30/2022 10:00 AM Shelva Majestic, MD [facility]  10/01/2022  9:15 AM Sherrie George, MD [facility] None  11/12/2022 10:30 AM [name], Konrad Dolores, MD [facility] None  03/31/2023  1:30 PM [facility] ANNUAL WELLNESS VISIT 1 [facility]   April D Calhoun, [facility] Clinical Pharmacist Assistant [phone]

## 2022-06-03 ENCOUNTER — Ambulatory Visit: Payer: PPO | Admitting: Pharmacist

## 2022-06-03 NOTE — Progress Notes (Signed)
Care Management & Coordination Services Pharmacy Note  06/03/2022 Name:  Darius Long. MRN:  [id number] DOB:  03-Mar-1945  Summary: PharmD FU visit.  BP and TSH controlled.  Recently stopped HCTZ and BP remains controlled.  Frequent urination has improved.  All other chronic conditions well managed.  Recommendations/Changes made from today's visit: No changes, continue to monitor BP Recheck LDL, consider increase to statin if much more above goal The 10-year ASCVD risk score (Arnett DK, et al., 2019) is: 54.2%  Follow up plan: FU 6 months   Subjective: Darius Long. is an 77 y.o. year old male who is a primary patient of Durene Cal, Aldine Contes, MD.  The care coordination team was consulted for assistance with disease management and care coordination needs.    Engaged with patient by telephone for follow up visit.  Recent office visits:  08/17/2021 OV (PCP) Shelva Majestic, MD; no medication changes indicated.   Recent consult visits:  05/13/22 - Endocrine, continue all meds  07/10/2021 OV Laurette Schimke) Charlie Pitter III, MD; no medication changes indicated.   06/25/2021 OV (Endo) [name], Konrad Dolores, MD; Decrease methimazole 5 mg, 1  tablet daily   Hospital visits:  None in previous 6 months   Objective:  Lab Results  Component Value Date   CREATININE 0.92 02/14/2022   BUN 20 02/14/2022   GFR 80.26 02/14/2022   NA 140 02/14/2022   K 4.1 02/14/2022   CALCIUM 9.0 02/14/2022   CO2 30 02/14/2022   GLUCOSE 77 02/14/2022    Lab Results  Component Value Date/Time   HGBA1C 6.1 02/14/2022 09:11 AM   HGBA1C 6.1 08/17/2021 12:19 PM   HGBA1C 5.9 04/08/2017 12:00 AM   GFR 80.26 02/14/2022 09:11 AM   GFR 75.58 08/17/2021 12:19 PM    Last diabetic Eye exam:  Lab Results  Component Value Date/Time   HMDIABEYEEXA No Retinopathy 01/02/2022 12:00 AM    Last diabetic Foot exam: No results found for: "HMDIABFOOTEX"   Lab Results  Component Value Date    CHOL 159 08/17/2021   HDL 44.10 08/17/2021   LDLCALC 102 (H) 08/17/2021   LDLDIRECT 75.0 02/14/2022   TRIG 64.0 08/17/2021   CHOLHDL 4 08/17/2021       Latest Ref Rng & Units 02/14/2022    9:11 AM 08/17/2021   12:19 PM 02/05/2021   11:19 AM  Hepatic Function  Total Protein 6.0 - 8.3 g/dL 6.5  6.8  6.7   Albumin 3.5 - 5.2 g/dL 4.1  4.3  4.2   AST 0 - 37 U/L 33  35  35   ALT 0 - 53 U/L 34  33  35   Alk Phosphatase 39 - 117 U/L 68  71  68   Total Bilirubin 0.2 - 1.2 mg/dL 0.8  1.1  1.0     Lab Results  Component Value Date/Time   TSH 2.91 05/13/2022 11:46 AM   TSH 3.53 02/27/2022 09:01 AM   FREET4 0.70 05/13/2022 11:46 AM   FREET4 0.58 (L) 02/27/2022 09:01 AM       Latest Ref Rng & Units 08/17/2021   12:19 PM 02/05/2021   11:19 AM 07/13/2020    4:23 PM  CBC  WBC 4.0 - 10.5 K/uL 4.5  6.1  7.0   Hemoglobin 13.0 - 17.0 g/dL 40.9  81.1  91.4   Hematocrit 39.0 - 52.0 % 42.7  44.3  41.7   Platelets 150.0 - 400.0 K/uL 226.0  249.0  231.0  No results found for: "VD25OH", "VITAMINB12"  Clinical ASCVD: No  The 10-year ASCVD risk score (Arnett DK, et al., 2019) is: 54.2%   Values used to calculate the score:     Age: 77 years     Sex: Male     Is Non-Hispanic African American: No     Diabetic: Yes     Tobacco smoker: No     Systolic Blood Pressure: 134 mmHg     Is BP treated: Yes     HDL Cholesterol: 44.1 mg/dL     Total Cholesterol: 159 mg/dL        1/61/0960    4:54 PM 03/09/2021    1:08 PM 07/13/2020    3:55 PM  Depression screen PHQ 2/9  Decreased Interest 0 0 0  Down, Depressed, Hopeless 0 0 0  PHQ - 2 Score 0 0 0     Social History   Tobacco Use  Smoking Status Never  Smokeless Tobacco Never   BP Readings from Last 3 Encounters:  05/13/22 134/80  02/14/22 124/68  12/27/21 132/78   Pulse Readings from Last 3 Encounters:  05/13/22 65  02/14/22 74  12/27/21 69   Wt Readings from Last 3 Encounters:  05/13/22 169 lb (76.7 kg)  03/18/22 171 lb (77.6  kg)  02/14/22 171 lb (77.6 kg)   BMI Readings from Last 3 Encounters:  05/13/22 25.10 kg/m  03/18/22 25.40 kg/m  02/14/22 25.40 kg/m    No Known Allergies  Medications Reviewed Today     Reviewed by Erroll Luna, [facility] (Pharmacist) on 06/03/22 at 1505  Med List Status: <None>   Medication Order Taking? Sig Documenting Provider Last Dose Status Informant  atorvastatin (LIPITOR) 40 MG tablet [id number] Yes Take 1 tablet (40 mg total) by mouth daily. Shelva Majestic, MD Taking Active   Black Pepper-Turmeric (TURMERIC CURCUMIN) 05-998 MG CAPS [id number] Yes Take 1 capsule by mouth daily. [provider] Taking Active Self  Calcium Citrate-Vitamin D (CITRACAL + D PO) [id number] Yes Take 25 mcg by mouth daily. [provider] Taking Active   CHONDROITIN SULFATE PO [id number] Yes Take 200 mg by mouth daily. [provider] Taking Active   diclofenac Sodium (VOLTAREN) 1 % GEL [id number] Yes APPLY 2 GRAMS TO AFFECTED AREAS EVERY NIGHT AT BEDTIME Shelva Majestic, MD Taking Active   escitalopram (LEXAPRO) 10 MG tablet [id number] Yes Take 1 tablet (10 mg total) by mouth daily. Shelva Majestic, MD Taking Active   Glucosamine HCl 1500 MG TABS [id number] Yes Take 1,500 mg by mouth daily. Glucosamine 1005 mg / day, condroitin 134 mg / day, calcium fructoborate 145 mg / day, MSM 1005 mg per day, Hyaluronic acid 2 mg / day [provider] Taking Active   latanoprost (XALATAN) 0.005 % ophthalmic solution [id number] Yes Place 1 drop into both eyes at bedtime. [provider] Taking Active   MAGNESIUM PO [id number] Yes Take 450 mg by mouth. [provider] Taking Active   metFORMIN (GLUCOPHAGE) 500 MG tablet [id number] Yes TAKE 1/2 TABLET BY MOUTH DAILY WITH BREAKFAST Shelva Majestic, MD Taking Active   methimazole (TAPAZOLE) 5 MG tablet [id number] Yes Take 1 tablet (5 mg total) by mouth as directed. Take 1 tablet Monday through Friday, none on Saturday  and Sunday [name], Konrad Dolores, MD Taking Active   Omega-3 Fatty Acids (OMEGA-3 FISH OIL PO) [id number] Yes Take by mouth. EPA 1380 mg / day, DHA 520 mg / day [provider] Taking  Active            Med Note Marzella Schlein   Fri Mar 03, 2020  1:05 PM) 690mg  epa, dha   OVER THE COUNTER MEDICATION [id number] Yes Take 2 capsules by mouth daily at 8 pm. Calcium 240 mg, vitamin D 100 mcg, Magnesium 120 mg [provider] Taking Active   Probiotic Product (PROBIOTIC ADVANCED PO) [id number] Yes Take 2 tablets by mouth daily. 30 billion cfu's [provider] Taking Active   valsartan (DIOVAN) 320 MG tablet [id number] Yes Take 1 tablet (320 mg total) by mouth daily. Shelva Majestic, MD Taking Active   vitamin E 400 UNIT capsule [id number] Yes Take 800 Units by mouth daily. [provider] Taking Active             SDOH:  (Social Determinants of Health) assessments and interventions performed: No, done within year Financial Resource Strain: Low Risk  (03/18/2022)   Overall Financial Resource Strain (CARDIA)    Difficulty of Paying Living Expenses: Not hard at all   Food Insecurity: No Food Insecurity (03/18/2022)   Hunger Vital Sign    Worried About Running Out of Food in the Last Year: Never true    Ran Out of Food in the Last Year: Never true    SDOH Interventions    Flowsheet Row Clinical Support from 03/18/2022 in [facility] [employer] [facility] from 02/05/2018 in [facility] [employer] [facility] from 01/01/2018 in [facility] [employer]  SDOH Interventions     Food Insecurity Interventions Intervention Not Indicated -- --  Housing Interventions Intervention Not Indicated -- --  Transportation Interventions Intervention Not Indicated -- --  Utilities Interventions Intervention Not Indicated -- --  Depression Interventions/Treatment  -- Medication Counseling  Financial Strain Interventions Intervention  Not Indicated -- --  Physical Activity Interventions Intervention Not Indicated -- --  Stress Interventions Intervention Not Indicated -- --  Social Connections Interventions Intervention Not Indicated -- --       Medication Assistance: None required.  Patient affirms current coverage meets needs.  Medication Access: Within the past 30 days, how often has patient missed a dose of medication? 0 Is a pillbox or other method used to improve adherence? No  Factors that may affect medication adherence? no barriers identified Are meds synced by current pharmacy? No  Are meds delivered by current pharmacy? No  Does patient experience delays in picking up medications due to transportation concerns? No   [facility] Services Reviewed: Is patient disadvantaged to use [facility]?: Yes  Current Rx insurance plan: HTA Name and location of Current pharmacy:  [facility] [id number] [place], [place] - [address] [place] [address] Phone: [phone] Fax: [phone]  [facility] [place], [place] - [address] [place] [address] Phone: [phone] Fax: [phone]  [facility] [id number] - [address] [place], [place] - [address] [place] [address] Phone: [phone] Fax: [phone]  [facility] services reviewed with patient today?: No  Patient requests to transfer care to [facility]?: No  Reason patient declined to change pharmacies: Disadvantaged due to insurance/mail order  Compliance/Adherence/Medication fill history: Star Rating Drugs:  Atorvastatin 40 mg last filled 04/01/2022 90 DS Metformin 500 mg last filled 05/28/2022 90 DS Valsartan 320 mg last filled 03/27/2022 90 DS     Care Gaps: Annual wellness visit in last year? Yes  Assessment/Plan                   Hypertension (BP goal <140/90) 06/06/22 -controlled -Current  treatment: Valsartan 320 mg (02/02/2020) Appropriate, Effective, Safe, Accessible -Medications previously tried: losartan 100 mg, amlodipine, HCTZ -Current home readings:  -Current dietary habits: Cheerios, fruit such as strawberries blue berries black berries. Lunch - beef stew, salmon. Evening - fat free yogurt, ice cream, nuts. Drinks - 3 cups of coffee per day, several cups of water  -Current exercise habits: occasional walking, pilates session once a week -Denies hypotensive/hypertensive symptoms -Educated on BP goals and benefits of medications for prevention of heart attack, stroke and kidney damage; Daily salt intake goal < 2300 mg; Exercise goal of 150 minutes per week; Importance of home blood pressure monitoring; -Recently stopped or "suspended" HCTZ.  BP seems to be well controlled.  He has not monitored at home the last few days.  Counseled on checking for a few days before appointment and bring readings with him.  Contact me if out of range consistently before then.  Update 09/19/20 BP remains controlled on current meds. He denies any dizziness/HA's Continue to monitor and exercise as current.  No changes to meds at this time  Update 05/28/21 Recent BP readings  107-127/50-60s. Denies any dizziness or HA. BP remains stable - he is still exercising at the [facility] a few times per wek. Continue meds as previous  Hyperlipidemia: (LDL goal < 70) -uncontrolled -Current treatment: Atorvastatin 40 mg once daily -Medications previously tried: n/a  -Current dietary patterns: see hypertension -Current exercise habits: see hypertension -Educated on Cholesterol goals;  Importance of limiting foods high in cholesterol; Exercise goal of 150 minutes per week; -Counseled on diet and exercise extensively Recommended to continue current medication  [place] (Goal: Normalization of TSH/free thyroid hormone) 06/03/22 -Controlled. TSH WNL -Current treatment  Methimazole 5mg  daily M-F  Appropriate, Effective, Safe, Accessible -Medications previously tried: n/a  -TSH now controlled on methimazole. -Counseled on proper use of levothyroxine.  -New dose of methimazole is controlling TSH currently.  Free T4. T3 and TSH are all WNL.   No  changes for now, continue to monitor TSH and other thyroid hormones.  Update 05/25/2021 Most recent TSH still depressed.  Continued on same dose and plans to follow up and check within a few months. Denies any weight loss as this has normalized. Continue current medication - recheck TSH at next Endocrine visit.         Willa Frater, PharmD Clinical Pharmacist  [facility] [phone]

## 2022-06-12 IMAGING — DX DG CHEST 2V
2 series · 2 of 2 positions shown · non-contrast
Comparison: None.

CLINICAL DATA: unintentional weight loss

EXAM:
CHEST - 2 VIEW

[chest pa]
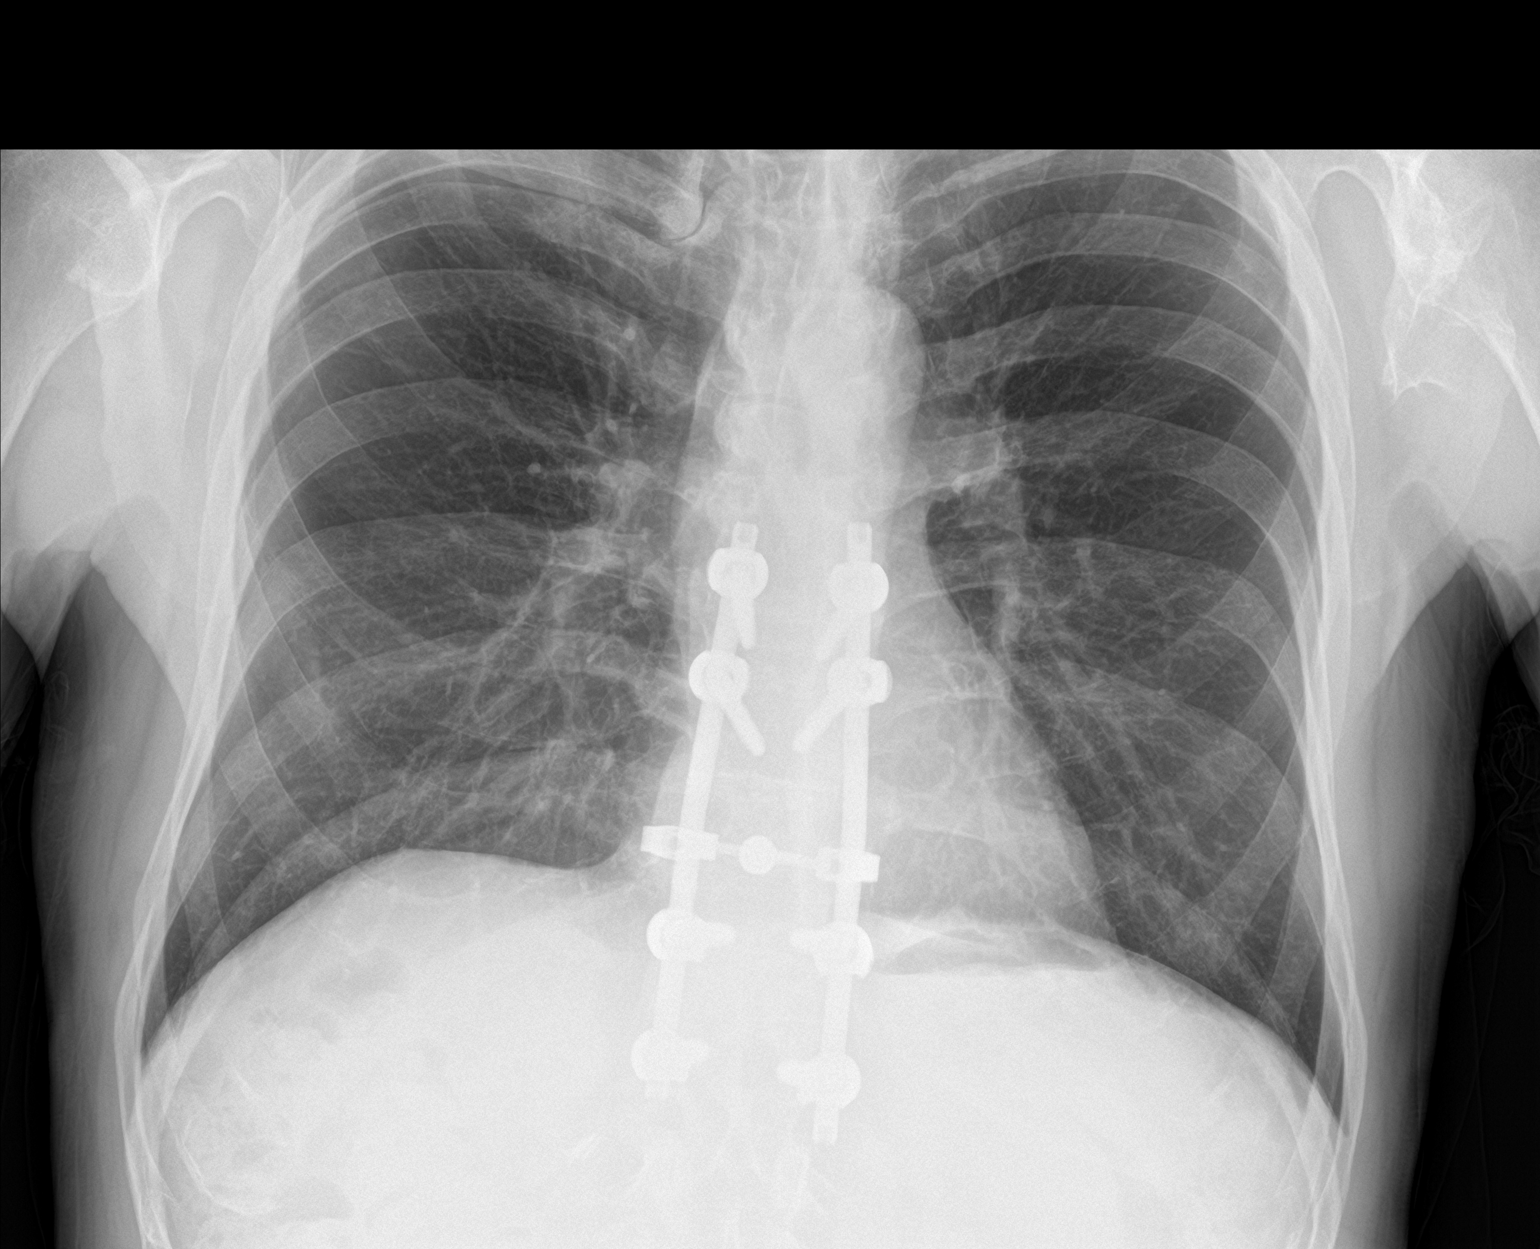

[chest lat]
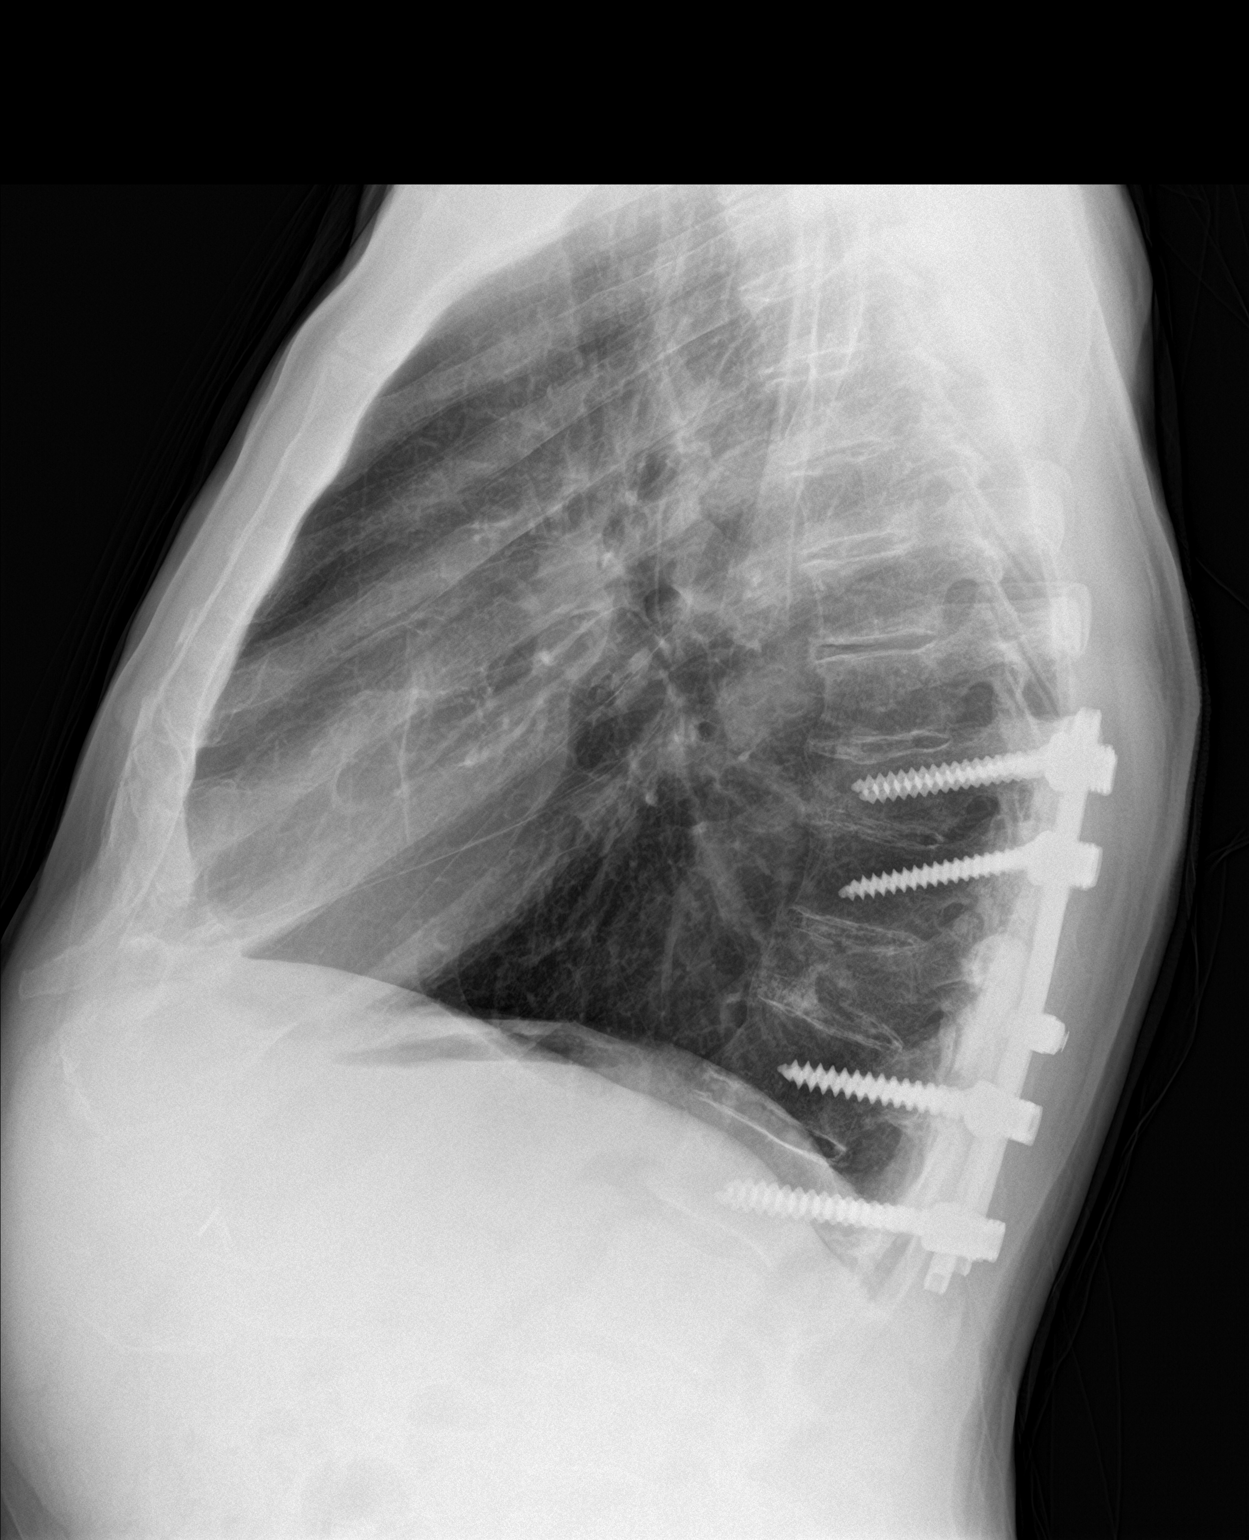

[2 of 2 positions shown; findings below may reference images not displayed]

FINDINGS: Medial right basilar atelectasis. No pneumothorax or pleural
effusion. Cardiomediastinal silhouette is within normal limits.
Multilevel spondylosis and posterior spinal fusion hardware. Right
sternoclavicular joint osteoarthrosis.
IMPRESSION: No focal airspace disease.  Minimal right basilar atelectasis.

## 2022-06-24 DIAGNOSIS — H40013 Open angle with borderline findings, low risk, bilateral: Secondary | ICD-10-CM | POA: Diagnosis not present

## 2022-06-24 DIAGNOSIS — E119 Type 2 diabetes mellitus without complications: Secondary | ICD-10-CM | POA: Diagnosis not present

## 2022-06-24 DIAGNOSIS — H532 Diplopia: Secondary | ICD-10-CM | POA: Diagnosis not present

## 2022-06-24 DIAGNOSIS — H2513 Age-related nuclear cataract, bilateral: Secondary | ICD-10-CM | POA: Diagnosis not present

## 2022-06-24 DIAGNOSIS — H43813 Vitreous degeneration, bilateral: Secondary | ICD-10-CM | POA: Diagnosis not present

## 2022-06-24 IMAGING — CR DG FOOT COMPLETE 3+V*L*
3 series · 3 of 3 positions shown · non-contrast
Comparison: None.

CLINICAL DATA: Left foot pain x2.5 months

EXAM:
LEFT FOOT - COMPLETE 3+ VIEW

[x foot ap left]
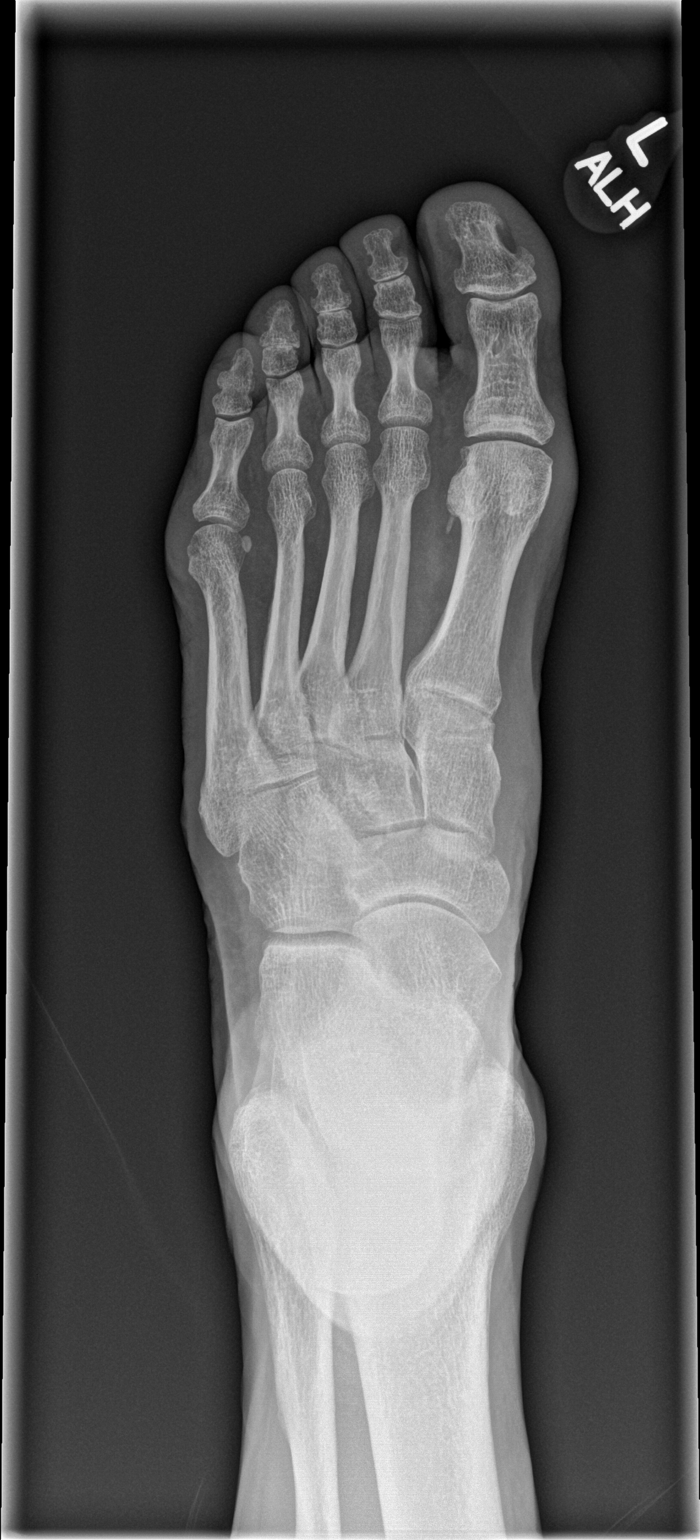

[x foot obl left]
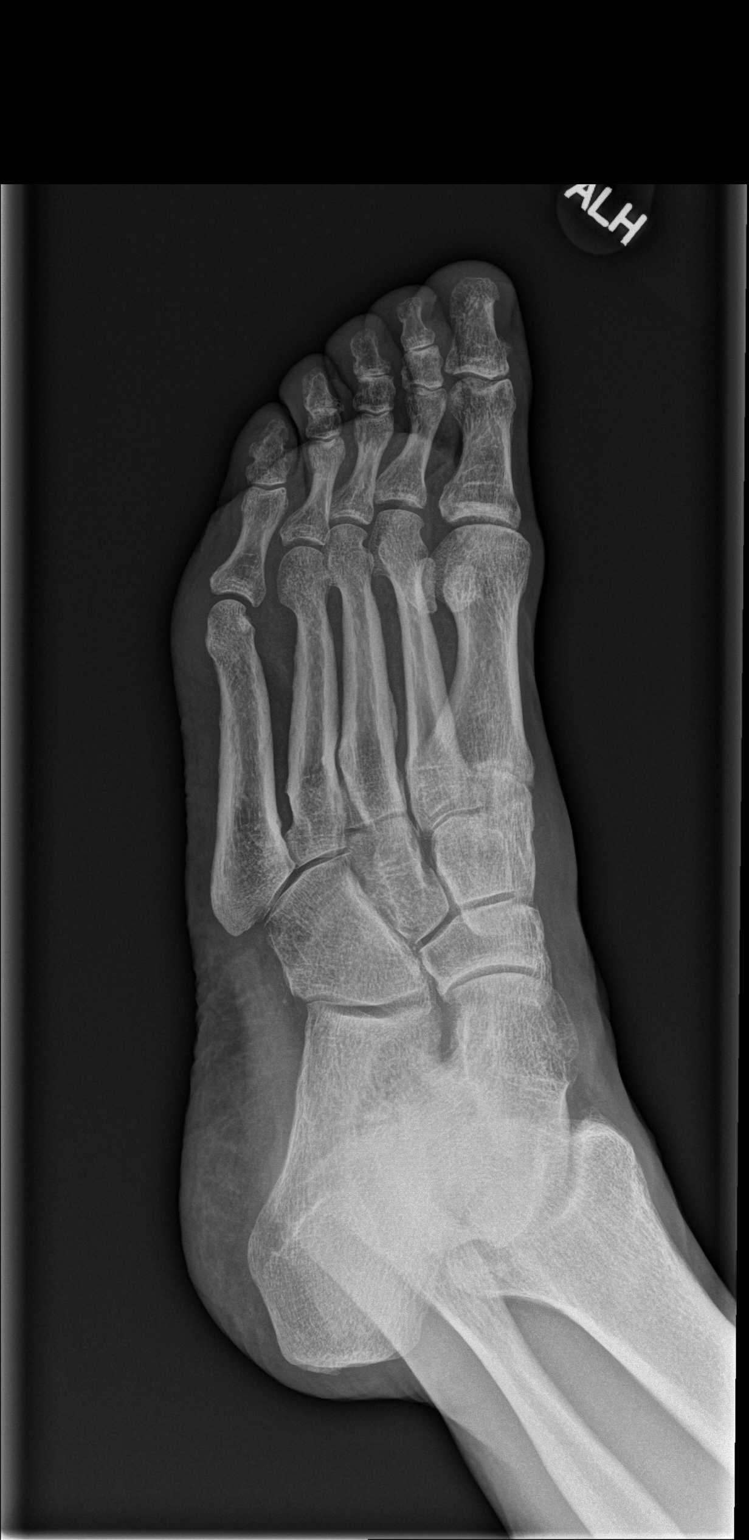

[x foot lat left]
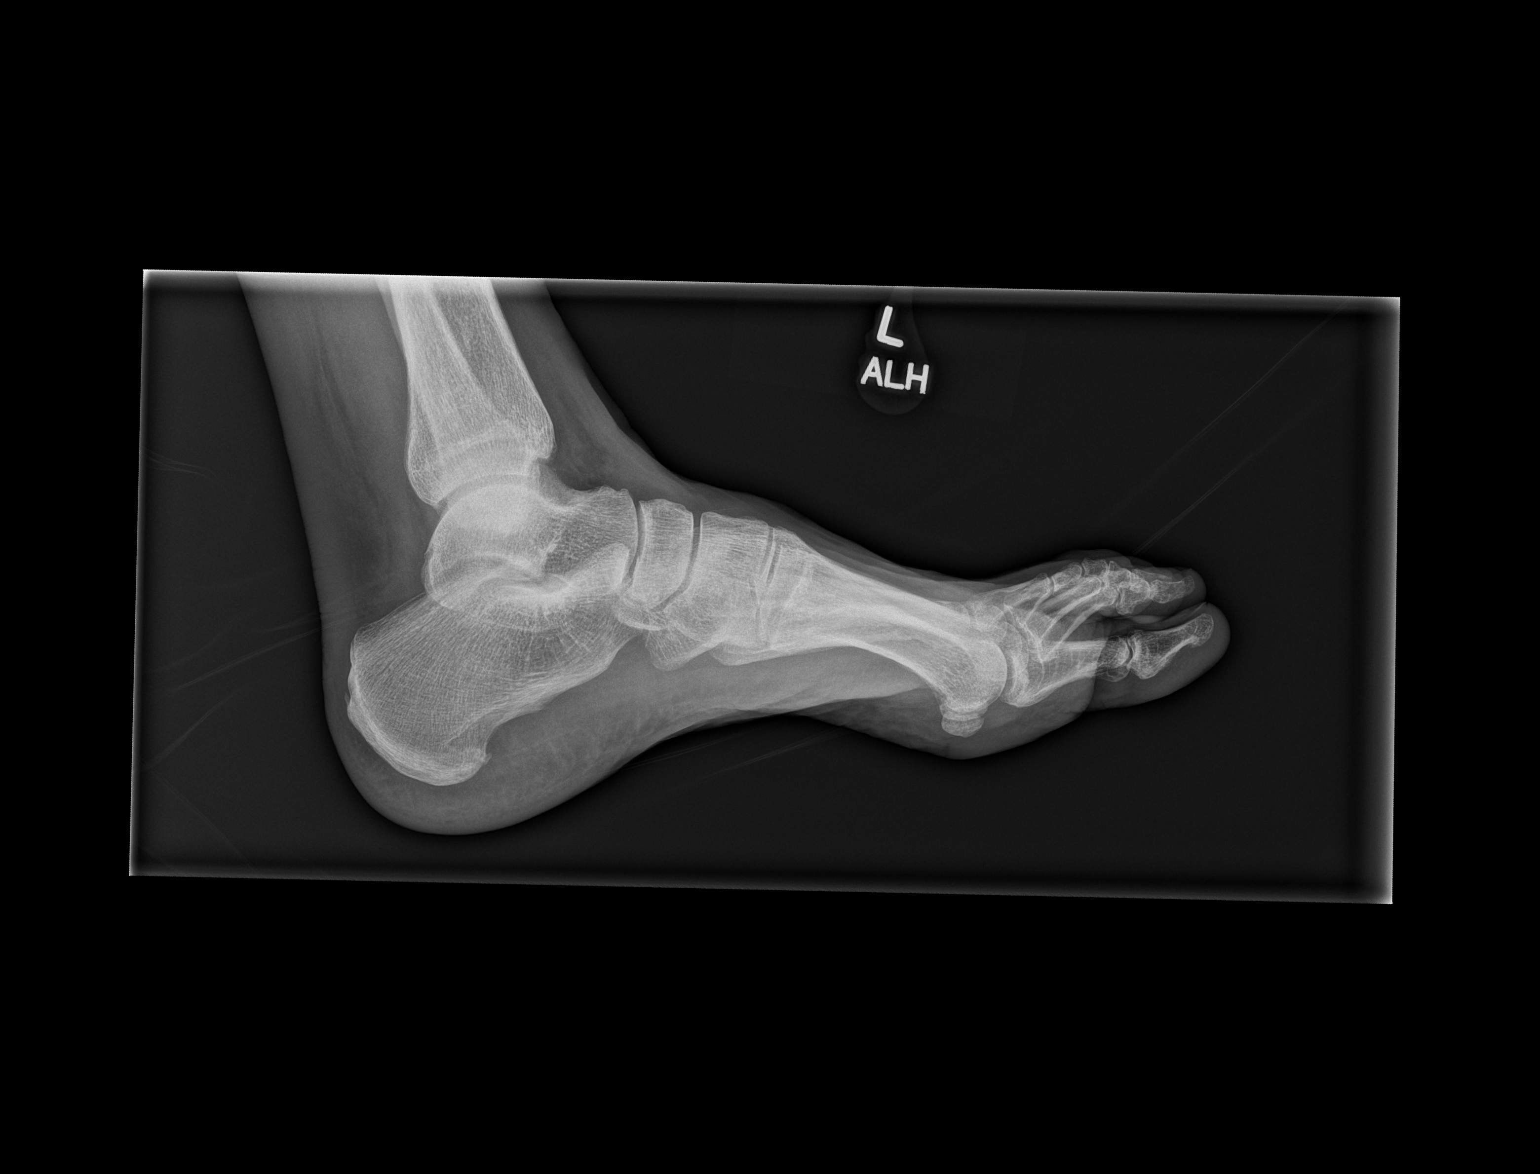

[3 of 3 positions shown; findings below may reference images not displayed]

FINDINGS: No fracture or dislocation is seen.

The joint spaces are preserved.

The visualized soft tissues are unremarkable.
IMPRESSION: Negative.

## 2022-07-05 ENCOUNTER — Other Ambulatory Visit: Payer: Self-pay | Admitting: Family Medicine

## 2022-08-17 ENCOUNTER — Encounter: Payer: Self-pay | Admitting: Family Medicine

## 2022-08-19 ENCOUNTER — Other Ambulatory Visit: Payer: Self-pay

## 2022-08-19 MED ORDER — METFORMIN HCL 500 MG PO TABS: ORAL_TABLET | ORAL | 3 refills | Status: DC

## 2022-08-27 ENCOUNTER — Encounter: Payer: PPO | Admitting: Family Medicine

## 2022-08-28 ENCOUNTER — Other Ambulatory Visit: Payer: PPO

## 2022-08-29 ENCOUNTER — Other Ambulatory Visit (INDEPENDENT_AMBULATORY_CARE_PROVIDER_SITE_OTHER): Payer: PPO

## 2022-08-29 DIAGNOSIS — E0500 Thyrotoxicosis with diffuse goiter without thyrotoxic crisis or storm: Secondary | ICD-10-CM | POA: Diagnosis not present

## 2022-08-29 LAB — TSH: TSH: 3.56 u[IU]/mL (ref 0.35–5.50)

## 2022-08-29 LAB — T4, FREE: Free T4: 0.59 ng/dL — ABNORMAL LOW (ref 0.60–1.60)

## 2022-08-30 ENCOUNTER — Encounter: Payer: Self-pay | Admitting: Internal Medicine

## 2022-08-30 ENCOUNTER — Encounter: Payer: PPO | Admitting: Family Medicine

## 2022-09-05 ENCOUNTER — Encounter (INDEPENDENT_AMBULATORY_CARE_PROVIDER_SITE_OTHER): Payer: Self-pay

## 2022-09-06 ENCOUNTER — Encounter: Payer: Self-pay | Admitting: Family Medicine

## 2022-09-16 ENCOUNTER — Other Ambulatory Visit: Payer: Self-pay | Admitting: Oncology

## 2022-09-16 DIAGNOSIS — Z006 Encounter for examination for normal comparison and control in clinical research program: Secondary | ICD-10-CM

## 2022-09-25 ENCOUNTER — Encounter: Payer: Self-pay | Admitting: Family Medicine

## 2022-09-30 DIAGNOSIS — H2513 Age-related nuclear cataract, bilateral: Secondary | ICD-10-CM | POA: Diagnosis not present

## 2022-09-30 DIAGNOSIS — H532 Diplopia: Secondary | ICD-10-CM | POA: Diagnosis not present

## 2022-09-30 DIAGNOSIS — H43813 Vitreous degeneration, bilateral: Secondary | ICD-10-CM | POA: Diagnosis not present

## 2022-09-30 DIAGNOSIS — E119 Type 2 diabetes mellitus without complications: Secondary | ICD-10-CM | POA: Diagnosis not present

## 2022-09-30 DIAGNOSIS — H40013 Open angle with borderline findings, low risk, bilateral: Secondary | ICD-10-CM | POA: Diagnosis not present

## 2022-10-01 ENCOUNTER — Encounter (INDEPENDENT_AMBULATORY_CARE_PROVIDER_SITE_OTHER): Payer: PPO | Admitting: Ophthalmology

## 2022-10-01 DIAGNOSIS — H33301 Unspecified retinal break, right eye: Secondary | ICD-10-CM | POA: Diagnosis not present

## 2022-10-01 DIAGNOSIS — H35033 Hypertensive retinopathy, bilateral: Secondary | ICD-10-CM | POA: Diagnosis not present

## 2022-10-01 DIAGNOSIS — H43813 Vitreous degeneration, bilateral: Secondary | ICD-10-CM | POA: Diagnosis not present

## 2022-10-01 DIAGNOSIS — I10 Essential (primary) hypertension: Secondary | ICD-10-CM | POA: Diagnosis not present

## 2022-10-01 DIAGNOSIS — H2513 Age-related nuclear cataract, bilateral: Secondary | ICD-10-CM

## 2022-10-02 ENCOUNTER — Ambulatory Visit (INDEPENDENT_AMBULATORY_CARE_PROVIDER_SITE_OTHER): Payer: PPO | Admitting: Family Medicine

## 2022-10-02 ENCOUNTER — Encounter: Payer: Self-pay | Admitting: Family Medicine

## 2022-10-02 VITALS — BP 110/62 | HR 63 | Temp 98.0°F | Ht 68.8 in | Wt 174.0 lb

## 2022-10-02 DIAGNOSIS — Z0000 Encounter for general adult medical examination without abnormal findings: Secondary | ICD-10-CM

## 2022-10-02 DIAGNOSIS — I10 Essential (primary) hypertension: Secondary | ICD-10-CM | POA: Diagnosis not present

## 2022-10-02 DIAGNOSIS — E0590 Thyrotoxicosis, unspecified without thyrotoxic crisis or storm: Secondary | ICD-10-CM | POA: Diagnosis not present

## 2022-10-02 DIAGNOSIS — R739 Hyperglycemia, unspecified: Secondary | ICD-10-CM

## 2022-10-02 DIAGNOSIS — Z125 Encounter for screening for malignant neoplasm of prostate: Secondary | ICD-10-CM

## 2022-10-02 DIAGNOSIS — E785 Hyperlipidemia, unspecified: Secondary | ICD-10-CM | POA: Diagnosis not present

## 2022-10-02 DIAGNOSIS — Z131 Encounter for screening for diabetes mellitus: Secondary | ICD-10-CM | POA: Diagnosis not present

## 2022-10-02 DIAGNOSIS — Z23 Encounter for immunization: Secondary | ICD-10-CM

## 2022-10-02 LAB — CBC WITH DIFFERENTIAL/PLATELET
Basophils Absolute: 0 10*3/uL (ref 0.0–0.1)
Basophils Relative: 0.6 % (ref 0.0–3.0)
Eosinophils Absolute: 0.2 10*3/uL (ref 0.0–0.7)
Eosinophils Relative: 3.6 % (ref 0.0–5.0)
HCT: 43.1 % (ref 39.0–52.0)
Hemoglobin: 14.2 g/dL (ref 13.0–17.0)
Lymphocytes Relative: 38.3 % (ref 12.0–46.0)
Lymphs Abs: 1.8 10*3/uL (ref 0.7–4.0)
MCHC: 32.9 g/dL (ref 30.0–36.0)
MCV: 93.7 fl (ref 78.0–100.0)
Monocytes Absolute: 0.5 10*3/uL (ref 0.1–1.0)
Monocytes Relative: 11.6 % (ref 3.0–12.0)
Neutro Abs: 2.1 10*3/uL (ref 1.4–7.7)
Neutrophils Relative %: 45.9 % (ref 43.0–77.0)
Platelets: 225 10*3/uL (ref 150.0–400.0)
RBC: 4.6 Mil/uL (ref 4.22–5.81)
RDW: 13.5 % (ref 11.5–15.5)
WBC: 4.6 10*3/uL (ref 4.0–10.5)

## 2022-10-02 LAB — COMPREHENSIVE METABOLIC PANEL
ALT: 34 U/L (ref 0–53)
AST: 34 U/L (ref 0–37)
Albumin: 4 g/dL (ref 3.5–5.2)
Alkaline Phosphatase: 57 U/L (ref 39–117)
BUN: 22 mg/dL (ref 6–23)
CO2: 30 meq/L (ref 19–32)
Calcium: 8.9 mg/dL (ref 8.4–10.5)
Chloride: 103 meq/L (ref 96–112)
Creatinine, Ser: 0.95 mg/dL (ref 0.40–1.50)
GFR: 76.89 mL/min (ref >60.00)
Glucose, Bld: 79 mg/dL (ref 70–99)
Potassium: 4.7 meq/L (ref 3.5–5.1)
Sodium: 139 meq/L (ref 135–145)
Total Bilirubin: 0.7 mg/dL (ref 0.2–1.2)
Total Protein: 6.3 g/dL (ref 6.0–8.3)

## 2022-10-02 LAB — LIPID PANEL
Cholesterol: 126 mg/dL (ref 0–200)
HDL: 44.5 mg/dL (ref >39.00)
LDL Cholesterol: 65 mg/dL (ref 0–99)
NonHDL: 81.53
Total CHOL/HDL Ratio: 3
Triglycerides: 83 mg/dL (ref 0.0–149.0)
VLDL: 16.6 mg/dL (ref 0.0–40.0)

## 2022-10-02 LAB — PSA, MEDICARE: PSA: 1.01 ng/mL (ref 0.10–4.00)

## 2022-10-02 LAB — HEMOGLOBIN A1C: Hgb A1c MFr Bld: 5.9 % (ref 4.6–6.5)

## 2022-10-02 LAB — T4, FREE: Free T4: 0.64 ng/dL (ref 0.60–1.60)

## 2022-10-02 LAB — TSH: TSH: 2.52 u[IU]/mL (ref 0.35–5.50)

## 2022-10-02 NOTE — Patient Instructions (Addendum)
Let us know when you get your COVID vaccine.  You received the high dose over 65 flu shot today  No changes today unless labs lead [place] to make changes  Recommended follow up: Return in about 6 months (around 04/01/2023) for followup or sooner if needed.Schedule b4 you leave.

## 2022-10-02 NOTE — Progress Notes (Signed)
Phone: [phone]   Subjective:  Patient presents today for their annual physical. Chief complaint-noted.   See problem oriented charting- ROS- full  review of systems was completed and negative  Per full ROS sheet completed by patient  The following were reviewed and entered/updated in epic: Past Medical History:  Diagnosis Date   Anxiety    Arthritis    Attention deficit disorder (ADD) in adult    Adderall 30mg  tablet- half tablet twice a day.    Chicken pox    Colon polyp    dad died of colon cancer- q5 year colonoscopy. 2018   Diabetes mellitus without complication (HCC)    Diverticulosis    Encounter for blood transfusion    related to overtreatment with PCN. became very ill.    Gall stones    Heart murmur    Sees cardiology October each year due to murmur after fall- get records. Seen for heart murmur heard 2010. We discused may not need to see cardiology. Every other year stres test   Hyperglycemia    a1c 5.9 in 03/2017   Hyperlipidemia    atorvastatin 40mg    Hypertension    losartan 50, hctz 12.5mg    Hypothyroidism    levothyroxine 100 mcg 6 days a week as of mid 2019.    Seasonal allergies    prn antihistamine   Patient Active Problem List   Diagnosis Date Noted   Hyperthyroidism 03/16/2020    Priority: High   OCD (obsessive compulsive disorder) 01/01/2018    Priority: High   Attention deficit disorder (ADD) in adult     Priority: High   Heart murmur     Priority: High   Nocturia 08/17/2021    Priority: Medium    Arthritis 07/07/2017    Priority: Medium    Hypertension     Priority: Medium    Hyperlipidemia     Priority: Medium    Hyperglycemia     Priority: Medium    Strain of right tibialis anterior muscle 10/30/2017    Priority: Low   Seasonal allergies     Priority: Low   Colon polyp     Priority: Low   Acute viral sinusitis 12/18/2020   Past Surgical History:  Procedure Laterality Date   Arthroscopic surgery Left 1986-1996   Damaged  cartilage Left knee   BACK SURGERY Bilateral 2008   Injury due to a fall. 2 fractured vertebrae, 3 fractured ribs fracture L scapulla, mild concussion   CHOLECYSTECTOMY  2014   Relocation surgery Right    For right elbow ulna nerve    REPAIR of torn retina Right 2014   TONSILECTOMY, ADENOIDECTOMY, BILATERAL MYRINGOTOMY AND TUBES  1958   vena Laser   2009   Remaoval of varicous veins    Family History  Problem Relation Age of Onset   Arthritis Mother        passed at age 11   Miscarriages / [place] Mother    Hearing loss Father    Colon cancer Father        died at 60   Heart disease Paternal Grandmother    Other Daughter        diabetes educator   Miscarriages / [place] Daughter    Diabetes Mellitus I Daughter        in [place]   Esophageal cancer Neg Hx    Stomach cancer Neg Hx    Colon polyps Neg Hx    Rectal cancer Neg Hx  Medications- reviewed and updated Current Outpatient Medications  Medication Sig Dispense Refill   atorvastatin (LIPITOR) 40 MG tablet TAKE 1 TABLET BY MOUTH DAILY 90 tablet 3   Black Pepper-Turmeric (TURMERIC CURCUMIN) 05-998 MG CAPS Take 1 capsule by mouth daily.     Calcium Citrate-Vitamin D (CITRACAL + D PO) Take 25 mcg by mouth daily.     CHONDROITIN SULFATE PO Take 200 mg by mouth daily.     diclofenac Sodium (VOLTAREN) 1 % GEL APPLY 2 GRAMS TO AFFECTED AREAS EVERY NIGHT AT BEDTIME 100 g 3   escitalopram (LEXAPRO) 10 MG tablet Take 1 tablet (10 mg total) by mouth daily. 90 tablet 3   Glucosamine HCl 1500 MG TABS Take 1,500 mg by mouth daily. Glucosamine 1005 mg / day, condroitin 134 mg / day, calcium fructoborate 145 mg / day, MSM 1005 mg per day, Hyaluronic acid 2 mg / day     latanoprost (XALATAN) 0.005 % ophthalmic solution Place 1 drop into both eyes at bedtime.     MAGNESIUM PO Take 450 mg by mouth.     metFORMIN (GLUCOPHAGE) 500 MG tablet TAKE 1/2 TABLET BY MOUTH DAILY WITH BREAKFAST 45 tablet 3   methimazole (TAPAZOLE) 5 MG  tablet Take 1 tablet (5 mg total) by mouth as directed. Take 1 tablet Monday through Friday, none on Saturday and Sunday 60 tablet 3   Omega-3 Fatty Acids (OMEGA-3 FISH OIL PO) Take by mouth. EPA 1380 mg / day, DHA 520 mg / day     OVER THE COUNTER MEDICATION Take 2 capsules by mouth daily at 8 pm. Calcium 240 mg, vitamin D 100 mcg, Magnesium 120 mg     Probiotic Product (PROBIOTIC ADVANCED PO) Take 2 tablets by mouth daily. 30 billion cfu's     valsartan (DIOVAN) 320 MG tablet Take 1 tablet (320 mg total) by mouth daily. 90 tablet 3   vitamin E 400 UNIT capsule Take 800 Units by mouth daily.     No current facility-administered medications for this visit.    Allergies-reviewed and updated No Known Allergies  Social History   Social History Narrative   Married. Twin daughters. 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 Oropharynx normal CV: RRR stable murmur heard best LUSB Lungs: CTAB no crackles, wheeze, rhonchi Abdomen: soft/nontender/nondistended/normal bowel sounds. No rebound or guarding.  Ext: no edema Skin: warm, dry Neuro: grossly normal, moves all extremities, PERRLA    Assessment and Plan  77 y.o. male presenting for annual physical.  Health Maintenance counseling: 1. Anticipatory guidance: Patient counseled regarding regular dental exams -q6 months, eye exams - more than yearly -see below,  avoiding smoking and second hand smoke , limiting alcohol to 2 beverages per day - rare social- wine, no illicit drugs .   2. Risk factor reduction:  Advised patient of need for regular exercise and diet rich and fruits  and vegetables to reduce risk of heart attack and stroke.  Exercise- [employer] [facility]) [employer]  once a week with pilates and occasional light yardwork.  Diet/weight management-has done good job maintaining weight.  Wt Readings from Last 3 Encounters:  10/02/22 174 lb (78.9 kg)  05/13/22 169 lb (76.7 kg)  03/18/22 171 lb (77.6 kg)  3. Immunizations/screenings/ancillary studies- flu shot today , COVID shot later in season - perhaps a week at [employer]  Administered Date(s) Administered   Covid-19, Mrna,Vaccine(Spikevax)58yrs and older 02/19/2022   Fluad Quad(high Dose 65+) 09/22/2018, 10/13/2019   Fluad Trivalent(High Dose 65+) 10/02/2022   Influenza, High Dose Seasonal PF 10/10/2017, 10/27/2020, 10/12/2021   MMR 04/14/2013   Moderna Covid-19 Vaccine Bivalent Booster 32yrs & up 05/18/2020   Moderna SARS-COV2 Booster Vaccination 05/18/2020   PFIZER(Purple Top)SARS-COV-2 Vaccination 02/28/2019, 03/25/2019, 10/22/2019, 11/13/2020   PNEUMOCOCCAL CONJUGATE-20 07/13/2020   Pneumococcal Conjugate-13 07/01/2013   Pneumococcal Polysaccharide-23 01/03/2012   Respiratory Syncytial Virus Vaccine,Recomb Aduvanted(Arexvy) 10/19/2021   Tdap 05/17/2013   Zoster Recombinant(Shingrix) 10/12/2017, 02/17/2018   Zoster, Live 08/02/2010  4. Prostate cancer screening-  low risk prior trend- update psa today per his preference- wants to continue with overall good health Lab Results  Component Value Date   PSA 0.94 08/17/2021   PSA 0.94 07/13/2020   PSA 1.22 04/08/2017   5. Colon cancer screening - colonoscopy November 27, 2021 with no repeat due to age per gastroenterology  6. Skin cancer screening- dermatology yearly. advised regular sunscreen use. Denies worrisome, changing, or new skin lesions.  7. Smoking associated screening (lung cancer screening, AAA screen 65-75, UA)- never smoker 8. STD screening - only active with wife   Status of chronic or acute  concerns   #Right eye issues- saw Dr. Dione Booze in June- has had prism adjustment needed - in early July had new glasses  and had to go back on Tuesday due to changes- he has been referred now to [facility]. Rodman Pickle at [facility]- worried about one of the muscles in the eye.  -yesterday had annual retinal check with Dr. Ashley Royalty with good update  # Heart murmur S: previously seen cardiology each October. Echo from 10/2016 showed mild mitral regurgitation only. Only mild abnormalities on echo 01/02/2018 -As long as murmur is faint and he has no symptoms he wants to hold off on further cardiology evaluation or echocardiogram A/P: stable murmur and no symptoms- continue to monitor without cardiology visit unless change  # Graves' disease-follows with Dr. Lonzo Cloud with history of hypothyroidism S: Medication: Methimazole 5 mg Monday through  Thursday, then none other 3 days - Also has left thyroid nodule which is being followed clinically but no neck symptoms  A/P: recent adjustment- hopefully improved     #hypertension S: medication: Valsartan 320 mg  - November 2023 suspended hydrochlorothiazide 12.5 mg due to frequent urination- found helpful Home readings #s: 120-130 at home usually- not checking as much BP Readings from Last 3 Encounters:  10/02/22 110/62  05/13/22 134/80  02/14/22 124/68  A/P: stable- continue current medicines   #hyperlipidemia-lipids above goal despite max dose statin S: Medication:Atorvastatin 40 mg -History of LFT elevation Lab Results  Component Value Date   CHOL 159 08/17/2021   HDL 44.10 08/17/2021   LDLCALC 102 (H) 08/17/2021   LDLDIRECT 75.0 02/14/2022   TRIG 64.0 08/17/2021   CHOLHDL 4 08/17/2021  A/P: close to ideal control- continue current medications      # Hyperglycemia/insulin resistance/prediabetes-A1c 6.22 June 2020 S:  Medication: Metformin 250 mg Lab Results  Component Value Date  HGBA1C 6.1 02/14/2022   HGBA1C 6.1  08/17/2021   HGBA1C 6.0 02/05/2021  A/P: hopefully stable- update a1c today. Continue current meds for now    # Adult ADD-on Adderall in the past but has tolerated more recently being off medication thankfully  #OCD- long term Lexapro 10 mg- anxiety related to OCD primarily  Recommended follow up: Return in about 6 months (around 04/01/2023) for followup or sooner if needed.Schedule b4 you leave. Future Appointments  Date Time Provider Department Center  11/12/2022 10:30 AM [name], Konrad Dolores, MD [facility] None  03/31/2023  1:30 PM [facility] ANNUAL WELLNESS VISIT 1 [facility]  10/01/2023  9:15 AM Sherrie George, MD [facility] None    Lab/Order associations:NOT fasting   ICD-10-CM   1. Preventative health care  Z00.00     2. Hyperlipidemia, unspecified hyperlipidemia type  E78.5 Comprehensive metabolic panel    CBC with Differential/Platelet    Lipid panel    3. Primary hypertension  I10 Urinalysis, Routine w reflex microscopic    4. Hyperglycemia  R73.9 Hemoglobin A1c    5. Screening for diabetes mellitus  Z13.1 Hemoglobin A1c    6. Hyperthyroidism  E05.90 TSH    T4, free    7. Screening for prostate cancer  Z12.5 PSA, Medicare    8. Need for influenza vaccination  Z23 Flu Vaccine Trivalent High Dose (Fluad)      No orders of the defined types were placed in this encounter.   Return precautions advised.  Tana Conch, MD

## 2022-10-03 LAB — URINALYSIS, ROUTINE W REFLEX MICROSCOPIC
Bilirubin Urine: NEGATIVE
Hgb urine dipstick: NEGATIVE
Ketones, ur: NEGATIVE
Leukocytes,Ua: NEGATIVE
Nitrite: NEGATIVE
RBC / HPF: NONE SEEN (ref 0–?)
Specific Gravity, Urine: 1.01 (ref 1.000–1.030)
Total Protein, Urine: NEGATIVE
Urine Glucose: NEGATIVE
Urobilinogen, UA: 0.2 (ref 0.0–1.0)
WBC, UA: NONE SEEN (ref 0–?)
pH: 6.5 (ref 5.0–8.0)

## 2022-10-16 DIAGNOSIS — M67361 Transient synovitis, right knee: Secondary | ICD-10-CM | POA: Diagnosis not present

## 2022-10-16 DIAGNOSIS — M25561 Pain in right knee: Secondary | ICD-10-CM | POA: Diagnosis not present

## 2022-10-16 DIAGNOSIS — M25461 Effusion, right knee: Secondary | ICD-10-CM | POA: Diagnosis not present

## 2022-11-12 ENCOUNTER — Encounter: Payer: Self-pay | Admitting: Internal Medicine

## 2022-11-12 ENCOUNTER — Ambulatory Visit: Payer: PPO | Admitting: Internal Medicine

## 2022-11-12 VITALS — BP 120/80 | HR 80 | Ht 68.8 in | Wt 178.0 lb

## 2022-11-12 DIAGNOSIS — E0590 Thyrotoxicosis, unspecified without thyrotoxic crisis or storm: Secondary | ICD-10-CM | POA: Diagnosis not present

## 2022-11-12 DIAGNOSIS — E0500 Thyrotoxicosis with diffuse goiter without thyrotoxic crisis or storm: Secondary | ICD-10-CM | POA: Diagnosis not present

## 2022-11-12 LAB — TSH: TSH: 2.07 u[IU]/mL (ref 0.35–5.50)

## 2022-11-12 LAB — T3, FREE: T3, Free: 3 pg/mL (ref 2.3–4.2)

## 2022-11-12 LAB — T4, FREE: Free T4: 0.63 ng/dL (ref 0.60–1.60)

## 2022-11-12 NOTE — Progress Notes (Signed)
Name: Darius Long.  MRN/ DOB: [id number], [birth year]    Age/ Sex: 77 y.o., male     PCP: Shelva Majestic, MD   Reason for Endocrinology Evaluation: Hyperthyroidism     Initial Endocrinology Clinic Visit: 03/15/2020    PATIENT IDENTIFIER: Darius Long. is a 77 y.o., male with a past medical history of HTN, dyslipidemia and OCD. He has followed with [facility] since 03/15/2020 for consultative assistance with management of his Hyperthyroidism      HISTORICAL SUMMARY:  They moved from [facility], [place] in May 2019  He was diagnosed with hypothyroidism ~ 15 yrs ago and was on LT-4  Until 02/03/2019     Over the summer/fall  of 2021 he has noted weight loss as well as decrease muscle mass. His LT-4 replacement has been gradually reduced until fully discontinued  On 02/03/2019   No amiodarone use   Methimazole started 02/2020  TRAB elevated 18.65 iu/L    Thyroid ultrasound showed sub centimeter left nodule that did not meet criteria for follow up 03/2020   No FH of thyroid disease     SUBJECTIVE:     Today (11/12/2022):  Darius Long is here for Hyperthyroidism.    Weight continues with fluctuating weight  Denies local neck swelling  Denies palpitations  Denies tremors  Denies diarrhea or constipation  Denies nausea or vomiting   He continues to follow-up with Dr. Dione Booze for eye exams 06/24/2022  Methimazole 5 mg ,1 tablet  Monday through Thursday  (none on Friday, Saturday and Sunday)    HISTORY:  Past Medical History:  Past Medical History:  Diagnosis Date   Anxiety    Arthritis    Attention deficit disorder (ADD) in adult    Adderall 30mg  tablet- half tablet twice a day.    Chicken pox    Colon polyp    dad died of colon cancer- q5 year colonoscopy. 2018   Diabetes mellitus without complication (HCC)    Diverticulosis    Encounter for blood transfusion    related to overtreatment with PCN. became very ill.    Gall  stones    Heart murmur    Sees cardiology October each year due to murmur after fall- get records. Seen for heart murmur heard 2010. We discused may not need to see cardiology. Every other year stres test   Hyperglycemia    a1c 5.9 in 03/2017   Hyperlipidemia    atorvastatin 40mg    Hypertension    losartan 50, hctz 12.5mg    Hypothyroidism    levothyroxine 100 mcg 6 days a week as of mid 2019.    Seasonal allergies    prn antihistamine   Past Surgical History:  Past Surgical History:  Procedure Laterality Date   Arthroscopic surgery Left 1986-1996   Damaged cartilage Left knee   BACK SURGERY Bilateral 2008   Injury due to a fall. 2 fractured vertebrae, 3 fractured ribs fracture L scapulla, mild concussion   CHOLECYSTECTOMY  2014   Relocation surgery Right    For right elbow ulna nerve    REPAIR of torn retina Right 2014   TONSILECTOMY, ADENOIDECTOMY, BILATERAL MYRINGOTOMY AND TUBES  1958   vena Laser   2009   Remaoval of varicous veins   Social History:  reports that he has never smoked. He has never used smokeless tobacco. He reports that he does not currently use alcohol. He reports that he does not use drugs. Family History:  Family History  Problem Relation Age of Onset   Arthritis Mother        passed at age 21   Miscarriages / [place] Mother    Hearing loss Father    Colon cancer Father        died at 67   Heart disease Paternal Grandmother    Other Daughter        diabetes educator   Miscarriages / Stillbirths Daughter    Diabetes Mellitus I Daughter        in [place]   Esophageal cancer Neg Hx    Stomach cancer Neg Hx    Colon polyps Neg Hx    Rectal cancer Neg Hx      HOME MEDICATIONS: Allergies as of 11/12/2022   No Known Allergies      Medication List        Accurate as of November 12, 2022 10:53 AM. If you have any questions, ask your nurse or doctor.          atorvastatin 40 MG tablet Commonly known as: LIPITOR TAKE 1 TABLET BY  MOUTH DAILY   CHONDROITIN SULFATE PO Take 200 mg by mouth daily.   CITRACAL + D PO Take 25 mcg by mouth daily.   diclofenac Sodium 1 % Gel Commonly known as: VOLTAREN APPLY 2 Long TO AFFECTED AREAS EVERY NIGHT AT BEDTIME   escitalopram 10 MG tablet Commonly known as: LEXAPRO Take 1 tablet (10 mg total) by mouth daily.   Glucosamine HCl 1500 MG Tabs Take 1,500 mg by mouth daily. Glucosamine 1005 mg / day, condroitin 134 mg / day, calcium fructoborate 145 mg / day, MSM 1005 mg per day, Hyaluronic acid 2 mg / day   latanoprost 0.005 % ophthalmic solution Commonly known as: XALATAN Place 1 drop into both eyes at bedtime.   MAGNESIUM PO Take 450 mg by mouth.   metFORMIN 500 MG tablet Commonly known as: GLUCOPHAGE TAKE 1/2 TABLET BY MOUTH DAILY WITH BREAKFAST   methimazole 5 MG tablet Commonly known as: TAPAZOLE Take 1 tablet (5 mg total) by mouth as directed. Take 1 tablet Monday through Friday, none on Saturday and Sunday   OMEGA-3 FISH OIL PO Take by mouth. EPA 1380 mg / day, DHA 520 mg / day   OVER THE COUNTER MEDICATION Take 2 capsules by mouth daily at 8 pm. Calcium 240 mg, vitamin D 100 mcg, Magnesium 120 mg   PROBIOTIC ADVANCED PO Take 2 tablets by mouth daily. 30 billion cfu's   Turmeric Curcumin 05-998 MG Caps Take 1 capsule by mouth daily.   valsartan 320 MG tablet Commonly known as: DIOVAN Take 1 tablet (320 mg total) by mouth daily.   vitamin E 180 MG (400 UNITS) capsule Take 800 Units by mouth daily.          OBJECTIVE:   PHYSICAL EXAM: VS: BP 120/80 (BP Location: Left Arm, Patient Position: Sitting, Cuff Size: Small)   Pulse 80   Ht 5' 8.8" (1.748 m)   Wt 178 lb (80.7 kg)   SpO2 96%   BMI 26.44 kg/m    EXAM: General: Pt appears well and is in NAD  Neck: General: Supple without adenopathy. Thyroid: Left thyroid nodule appreciated  Lungs: Clear with good BS bilat   Heart: Auscultation: RRR.  Extremities:  BL LE: No pretibial edema    Mental Status: Judgment, insight: Intact Orientation: Oriented to time, place, and person Mood and affect: No depression, anxiety, or agitation  DATA REVIEWED:  Latest Reference Range & Units 11/12/22 10:57  TSH 0.35 - 5.50 uIU/mL 2.07  Triiodothyronine,Free,Serum 2.3 - 4.2 pg/mL 3.0  T4,Free(Direct) 0.60 - 1.60 ng/dL 1.61    Results for NAKEEM, TRZCINSKI (MRN [id number]) as of 06/12/2020 09:57  Ref. Range 03/15/2020 12:16  TRAB Latest Ref Range: <=2.00 IU/L 18.65 (H)   Thyroid ULtrasound 04/04/2020    Parenchymal Echotexture: Moderately heterogenous   Isthmus: 0.4 cm   Right lobe: 4.8 x 2.3 x 2.0 cm   Left lobe: 4.1 x 2.2 x 1.7 cm   _________________________________________________________   Estimated total number of nodules >/= 1 cm: 0   Number of spongiform nodules >/=  2 cm not described below (TR1): 0   Number of mixed cystic and solid nodules >/= 1.5 cm not described below (TR2): 0   _________________________________________________________   Nodule # 1:   Location: Left; Superior   Maximum size: 0.7 cm; Other 2 dimensions: 0.6 x 0.5 cm   Composition: solid/almost completely solid (2)   Echogenicity: isoechoic (1)   Shape: not taller-than-wide (0)   Margins: smooth (0)   Echogenic foci: none (0)   ACR TI-RADS total points: 3.   ACR TI-RADS risk category: TR3 (3 points).   ACR TI-RADS recommendations:   Given size (<1.4 cm) and appearance, this nodule does NOT meet TI-RADS criteria for biopsy or dedicated follow-up.   _________________________________________________________   IMPRESSION: 1. Moderately heterogeneous, normal-sized thyroid gland. 2. Single solid left upper thyroid nodule (labeled 1, 0.7 cm) which does not meet criteria (TI-RADS category 3) for dedicated ultrasound follow-up or tissue sampling.   The above is in keeping with the ACR TI-RADS recommendations - J Am Coll Radiol 2017;14:587-595.   Marliss Coots, MD    [facility]   [facility]     ASSESSMENT / PLAN / RECOMMENDATIONS:   Hyperthyroidism:   -Patient with Graves' disease but he also has a left thyroid nodule which could also be contributing to hyperthyroidism -Patient is clinically euthyroid -No local neck symptoms -Historically he has been noted with sensitivity to methimazole changes. -TFTs remain within normal range, will decrease methimazole as below    Medications  Decrease methimazole 5 mg, 1  tablet Monday through Wednesday (none on Thursday, Friday, Saturday and Sunday)    2. Graves' Disease :  -No extrathyroidal manifestations of Graves' disease.  He is up-to-date on eye exams    3. Left Thyroid Nodule:  -No local neck symptoms -This is too small to follow-up  Follow-up in 6 months Labs in 2 months   Signed electronically by: Lyndle Herrlich, MD  [facility] Endocrinology  [facility] [address] [place] [address] [place], [place] [address] Phone: [phone] FAX: [phone]      CC: Shelva Majestic, MD [address] [place] [address] Phone: [phone]  Fax: [phone]   Return to Endocrinology clinic as below: Future Appointments  Date Time Provider Department Center  03/31/2023  1:30 PM [facility] ANNUAL WELLNESS VISIT 1 [facility]  04/02/2023  9:20 AM Shelva Majestic, MD [facility]  10/01/2023  9:15 AM Sherrie George, MD [facility] None

## 2022-11-27 DIAGNOSIS — L308 Other specified dermatitis: Secondary | ICD-10-CM | POA: Diagnosis not present

## 2022-11-27 DIAGNOSIS — L812 Freckles: Secondary | ICD-10-CM | POA: Diagnosis not present

## 2022-11-27 DIAGNOSIS — L821 Other seborrheic keratosis: Secondary | ICD-10-CM | POA: Diagnosis not present

## 2022-11-27 DIAGNOSIS — L819 Disorder of pigmentation, unspecified: Secondary | ICD-10-CM | POA: Diagnosis not present

## 2022-11-27 DIAGNOSIS — D1801 Hemangioma of skin and subcutaneous tissue: Secondary | ICD-10-CM | POA: Diagnosis not present

## 2022-12-04 ENCOUNTER — Ambulatory Visit
Admission: RE | Admit: 2022-12-04 | Discharge: 2022-12-04 | Disposition: A | Discharge status: Home / Self Care | Payer: PPO | Source: Ambulatory Visit | Attending: Sports Medicine | Admitting: Sports Medicine

## 2022-12-04 ENCOUNTER — Other Ambulatory Visit: Payer: Self-pay | Admitting: Sports Medicine

## 2022-12-04 DIAGNOSIS — M9906 Segmental and somatic dysfunction of lower extremity: Secondary | ICD-10-CM | POA: Diagnosis not present

## 2022-12-04 DIAGNOSIS — M9908 Segmental and somatic dysfunction of rib cage: Secondary | ICD-10-CM | POA: Diagnosis not present

## 2022-12-04 DIAGNOSIS — M25561 Pain in right knee: Secondary | ICD-10-CM | POA: Diagnosis not present

## 2022-12-04 DIAGNOSIS — M9907 Segmental and somatic dysfunction of upper extremity: Secondary | ICD-10-CM | POA: Diagnosis not present

## 2022-12-04 DIAGNOSIS — M9904 Segmental and somatic dysfunction of sacral region: Secondary | ICD-10-CM | POA: Diagnosis not present

## 2022-12-04 DIAGNOSIS — M792 Neuralgia and neuritis, unspecified: Secondary | ICD-10-CM

## 2022-12-04 DIAGNOSIS — M25552 Pain in left hip: Secondary | ICD-10-CM | POA: Diagnosis not present

## 2022-12-04 DIAGNOSIS — M1711 Unilateral primary osteoarthritis, right knee: Secondary | ICD-10-CM | POA: Diagnosis not present

## 2022-12-04 DIAGNOSIS — M25531 Pain in right wrist: Secondary | ICD-10-CM

## 2022-12-04 DIAGNOSIS — M25541 Pain in joints of right hand: Secondary | ICD-10-CM | POA: Diagnosis not present

## 2022-12-04 DIAGNOSIS — M9905 Segmental and somatic dysfunction of pelvic region: Secondary | ICD-10-CM | POA: Diagnosis not present

## 2022-12-04 DIAGNOSIS — G8929 Other chronic pain: Secondary | ICD-10-CM | POA: Diagnosis not present

## 2022-12-04 DIAGNOSIS — M9903 Segmental and somatic dysfunction of lumbar region: Secondary | ICD-10-CM | POA: Diagnosis not present

## 2022-12-20 ENCOUNTER — Encounter: Payer: Self-pay | Admitting: Family Medicine

## 2022-12-22 ENCOUNTER — Other Ambulatory Visit: Payer: Self-pay | Admitting: Family Medicine

## 2022-12-23 ENCOUNTER — Other Ambulatory Visit: Payer: Self-pay

## 2022-12-23 MED ORDER — DICLOFENAC SODIUM 1 % EX GEL: CUTANEOUS | 3 refills | Status: AC

## 2022-12-25 DIAGNOSIS — M25531 Pain in right wrist: Secondary | ICD-10-CM | POA: Diagnosis not present

## 2022-12-25 DIAGNOSIS — M9904 Segmental and somatic dysfunction of sacral region: Secondary | ICD-10-CM | POA: Diagnosis not present

## 2022-12-25 DIAGNOSIS — M9905 Segmental and somatic dysfunction of pelvic region: Secondary | ICD-10-CM | POA: Diagnosis not present

## 2022-12-25 DIAGNOSIS — M25462 Effusion, left knee: Secondary | ICD-10-CM | POA: Diagnosis not present

## 2022-12-25 DIAGNOSIS — M25562 Pain in left knee: Secondary | ICD-10-CM | POA: Diagnosis not present

## 2022-12-25 DIAGNOSIS — M25431 Effusion, right wrist: Secondary | ICD-10-CM | POA: Diagnosis not present

## 2022-12-25 DIAGNOSIS — M9903 Segmental and somatic dysfunction of lumbar region: Secondary | ICD-10-CM | POA: Diagnosis not present

## 2022-12-25 DIAGNOSIS — M9906 Segmental and somatic dysfunction of lower extremity: Secondary | ICD-10-CM | POA: Diagnosis not present

## 2022-12-25 DIAGNOSIS — M25552 Pain in left hip: Secondary | ICD-10-CM | POA: Diagnosis not present

## 2022-12-25 DIAGNOSIS — M1712 Unilateral primary osteoarthritis, left knee: Secondary | ICD-10-CM | POA: Diagnosis not present

## 2022-12-31 DIAGNOSIS — M25552 Pain in left hip: Secondary | ICD-10-CM | POA: Diagnosis not present

## 2022-12-31 DIAGNOSIS — M25531 Pain in right wrist: Secondary | ICD-10-CM | POA: Diagnosis not present

## 2022-12-31 DIAGNOSIS — M9902 Segmental and somatic dysfunction of thoracic region: Secondary | ICD-10-CM | POA: Diagnosis not present

## 2022-12-31 DIAGNOSIS — M25561 Pain in right knee: Secondary | ICD-10-CM | POA: Diagnosis not present

## 2022-12-31 DIAGNOSIS — G2581 Restless legs syndrome: Secondary | ICD-10-CM | POA: Diagnosis not present

## 2022-12-31 DIAGNOSIS — M9905 Segmental and somatic dysfunction of pelvic region: Secondary | ICD-10-CM | POA: Diagnosis not present

## 2022-12-31 DIAGNOSIS — M9908 Segmental and somatic dysfunction of rib cage: Secondary | ICD-10-CM | POA: Diagnosis not present

## 2022-12-31 DIAGNOSIS — M9903 Segmental and somatic dysfunction of lumbar region: Secondary | ICD-10-CM | POA: Diagnosis not present

## 2022-12-31 DIAGNOSIS — M9904 Segmental and somatic dysfunction of sacral region: Secondary | ICD-10-CM | POA: Diagnosis not present

## 2022-12-31 DIAGNOSIS — M9906 Segmental and somatic dysfunction of lower extremity: Secondary | ICD-10-CM | POA: Diagnosis not present

## 2023-01-01 DIAGNOSIS — H2513 Age-related nuclear cataract, bilateral: Secondary | ICD-10-CM | POA: Diagnosis not present

## 2023-01-01 DIAGNOSIS — H532 Diplopia: Secondary | ICD-10-CM | POA: Diagnosis not present

## 2023-01-01 DIAGNOSIS — E119 Type 2 diabetes mellitus without complications: Secondary | ICD-10-CM | POA: Diagnosis not present

## 2023-01-01 DIAGNOSIS — H43813 Vitreous degeneration, bilateral: Secondary | ICD-10-CM | POA: Diagnosis not present

## 2023-01-01 DIAGNOSIS — H40013 Open angle with borderline findings, low risk, bilateral: Secondary | ICD-10-CM | POA: Diagnosis not present

## 2023-01-01 LAB — HM DIABETES EYE EXAM

## 2023-01-28 ENCOUNTER — Telehealth: Payer: Self-pay

## 2023-01-28 DIAGNOSIS — E0590 Thyrotoxicosis, unspecified without thyrotoxic crisis or storm: Secondary | ICD-10-CM

## 2023-01-28 NOTE — Telephone Encounter (Signed)
 Orders Placed This Encounter  Procedures   TSH   T4, free

## 2023-01-29 ENCOUNTER — Other Ambulatory Visit: Payer: Self-pay

## 2023-01-29 DIAGNOSIS — E0590 Thyrotoxicosis, unspecified without thyrotoxic crisis or storm: Secondary | ICD-10-CM | POA: Diagnosis not present

## 2023-01-30 LAB — T4, FREE: Free T4: 1 ng/dL (ref 0.8–1.8)

## 2023-01-30 LAB — TSH: TSH: 1.89 m[IU]/L (ref 0.40–4.50)

## 2023-02-03 DIAGNOSIS — M25631 Stiffness of right wrist, not elsewhere classified: Secondary | ICD-10-CM | POA: Diagnosis not present

## 2023-02-03 DIAGNOSIS — M9907 Segmental and somatic dysfunction of upper extremity: Secondary | ICD-10-CM | POA: Diagnosis not present

## 2023-02-03 DIAGNOSIS — R202 Paresthesia of skin: Secondary | ICD-10-CM | POA: Diagnosis not present

## 2023-02-03 DIAGNOSIS — M25531 Pain in right wrist: Secondary | ICD-10-CM | POA: Diagnosis not present

## 2023-03-26 ENCOUNTER — Other Ambulatory Visit: Payer: Self-pay

## 2023-03-26 ENCOUNTER — Encounter: Payer: Self-pay | Admitting: Family Medicine

## 2023-03-26 MED ORDER — ESCITALOPRAM OXALATE 10 MG PO TABS: 10.0000 mg | ORAL_TABLET | Freq: Every day | ORAL | 3 refills | Status: AC

## 2023-03-31 ENCOUNTER — Ambulatory Visit: Payer: PPO | Admitting: Family Medicine

## 2023-03-31 ENCOUNTER — Encounter: Payer: Self-pay | Admitting: Family Medicine

## 2023-03-31 ENCOUNTER — Ambulatory Visit: Payer: PPO

## 2023-03-31 VITALS — BP 126/70 | HR 85 | Temp 98.1°F | Ht 68.8 in | Wt 172.0 lb

## 2023-03-31 DIAGNOSIS — E785 Hyperlipidemia, unspecified: Secondary | ICD-10-CM

## 2023-03-31 DIAGNOSIS — Z131 Encounter for screening for diabetes mellitus: Secondary | ICD-10-CM | POA: Diagnosis not present

## 2023-03-31 DIAGNOSIS — F429 Obsessive-compulsive disorder, unspecified: Secondary | ICD-10-CM

## 2023-03-31 DIAGNOSIS — Z7984 Long term (current) use of oral hypoglycemic drugs: Secondary | ICD-10-CM | POA: Diagnosis not present

## 2023-03-31 DIAGNOSIS — R739 Hyperglycemia, unspecified: Secondary | ICD-10-CM | POA: Diagnosis not present

## 2023-03-31 DIAGNOSIS — I10 Essential (primary) hypertension: Secondary | ICD-10-CM

## 2023-03-31 DIAGNOSIS — E0590 Thyrotoxicosis, unspecified without thyrotoxic crisis or storm: Secondary | ICD-10-CM | POA: Diagnosis not present

## 2023-03-31 LAB — COMPREHENSIVE METABOLIC PANEL
ALT: 36 U/L (ref 0–53)
AST: 34 U/L (ref 0–37)
Albumin: 4.4 g/dL (ref 3.5–5.2)
Alkaline Phosphatase: 53 U/L (ref 39–117)
BUN: 19 mg/dL (ref 6–23)
CO2: 31 meq/L (ref 19–32)
Calcium: 9.2 mg/dL (ref 8.4–10.5)
Chloride: 101 meq/L (ref 96–112)
Creatinine, Ser: 0.91 mg/dL (ref 0.40–1.50)
GFR: 80.68 mL/min (ref >60.00)
Glucose, Bld: 87 mg/dL (ref 70–99)
Potassium: 4.6 meq/L (ref 3.5–5.1)
Sodium: 138 meq/L (ref 135–145)
Total Bilirubin: 0.7 mg/dL (ref 0.2–1.2)
Total Protein: 7.2 g/dL (ref 6.0–8.3)

## 2023-03-31 LAB — HEMOGLOBIN A1C: Hgb A1c MFr Bld: 6.1 % (ref 4.6–6.5)

## 2023-03-31 LAB — LDL CHOLESTEROL, DIRECT: Direct LDL: 73 mg/dL

## 2023-03-31 NOTE — Patient Instructions (Addendum)
 Please stop by lab before you go If you have mychart- we will send your results within 3 business days of [place] receiving them.  If you do not have mychart- we will call you about results within 5 business days of [place] receiving them.  *please also note that you will see labs on mychart as soon as they post. I will later go in and write notes on them- will say "notes from Dr. Durene Cal"   Recommended follow up: Return in about 6 months (around 10/01/2023) for physical or sooner if needed.Schedule b4 you leave.

## 2023-03-31 NOTE — Progress Notes (Signed)
 Phone [phone] In person visit   Subjective:   Darius Long. is a 78 y.o. year old very pleasant male patient who presents for/with See problem oriented charting Chief Complaint  Patient presents with   Medical Management of Chronic Issues   Hypertension    Past Medical History-  Patient Active Problem List   Diagnosis Date Noted   Hyperthyroidism 03/16/2020    Priority: High   OCD (obsessive compulsive disorder) 01/01/2018    Priority: High   Attention deficit disorder (ADD) in adult     Priority: High   Heart murmur     Priority: High   Nocturia 08/17/2021    Priority: Medium    Arthritis 07/07/2017    Priority: Medium    Hypertension     Priority: Medium    Hyperlipidemia     Priority: Medium    Hyperglycemia     Priority: Medium    Strain of right tibialis anterior muscle 10/30/2017    Priority: Low   Seasonal allergies     Priority: Low   Colon polyp     Priority: Low   Acute viral sinusitis 12/18/2020    Medications- reviewed and updated Current Outpatient Medications  Medication Sig Dispense Refill   atorvastatin (LIPITOR) 40 MG tablet TAKE 1 TABLET BY MOUTH DAILY 90 tablet 3   Black Pepper-Turmeric (TURMERIC CURCUMIN) 05-998 MG CAPS Take 1 capsule by mouth daily.     Calcium Citrate-Vitamin D (CITRACAL + D PO) Take 25 mcg by mouth daily.     CHONDROITIN SULFATE PO Take 200 mg by mouth daily.     diclofenac Sodium (VOLTAREN) 1 % GEL APPLY 2 GRAMS TO AFFECTED AREAS EVERY NIGHT AT BEDTIME 350 g 3   escitalopram (LEXAPRO) 10 MG tablet Take 1 tablet (10 mg total) by mouth daily. 90 tablet 3   Glucosamine HCl 1500 MG TABS Take 1,500 mg by mouth daily. Glucosamine 1005 mg / day, condroitin 134 mg / day, calcium fructoborate 145 mg / day, MSM 1005 mg per day, Hyaluronic acid 2 mg / day     latanoprost (XALATAN) 0.005 % ophthalmic solution Place 1 drop into both eyes at bedtime.     MAGNESIUM PO Take 450 mg by mouth.     metFORMIN (GLUCOPHAGE) 500  MG tablet TAKE 1/2 TABLET BY MOUTH DAILY WITH BREAKFAST 45 tablet 3   methimazole (TAPAZOLE) 5 MG tablet Take 1 tablet (5 mg total) by mouth as directed. Take 1 tablet Monday through Friday, none on Saturday and Sunday 60 tablet 3   Omega-3 Fatty Acids (OMEGA-3 FISH OIL PO) Take by mouth. EPA 1380 mg / day, DHA 520 mg / day     OVER THE COUNTER MEDICATION Take 2 capsules by mouth daily at 8 pm. Calcium 240 mg, vitamin D 100 mcg, Magnesium 120 mg     Probiotic Product (PROBIOTIC ADVANCED PO) Take 2 tablets by mouth daily. 30 billion cfu's     valsartan (DIOVAN) 320 MG tablet TAKE 1 TABLET BY MOUTH DAILY 90 tablet 3   vitamin E 400 UNIT capsule Take 800 Units by mouth daily.     No current facility-administered medications for this visit.     Objective:  BP 126/70   Pulse 85   Temp 98.1 F (36.7 C)   Ht 5' 8.8" (1.748 m)   Wt 172 lb (78 kg)   SpO2 96%   BMI 25.55 kg/m  Gen: NAD, resting comfortably CV: RRR stable murmur Lungs: CTAB no  crackles, wheeze, rhonchi Ext: no edema Skin: warm, dry     Assessment and Plan   #social update- going to [employer] [place] to visit daughter. Would need to use [facility] [employer] prescription if needed.   # Heart murmur S: previously seen cardiology each October. Echo from 10/2016 showed mild mitral regurgitation only. Only mild abnormalities on echo 01/02/2018 -As long as murmur is faint and he has no symptoms he wants to hold off on further cardiology evaluation or echocardiogram A/P: stable on exam and no symptoms- continue to monitor   # Graves' disease-follows with Dr. Lonzo Cloud with history of hypothyroidism S: Medication: Methimazole 5 mg Monday through Wednesday, then none other 4 days - Also has left thyroid nodule which is being followed clinically but no neck symptoms  A/P: recheck on thyroid was in normal range - plan for 6 month follow up from october - doing well- continue current medications     #hypertension S: medication:  Valsartan 320 mg BP Readings from Last 3 Encounters:  03/31/23 126/70  11/12/22 120/80  10/02/22 110/62  A/P: well controlled continue current medications   #hyperlipidemia-lipids above goal despite max dose statin S: Medication:Atorvastatin 40 mg -History of LFT elevation Lab Results  Component Value Date   CHOL 126 10/02/2022   HDL 44.50 10/02/2022   LDLCALC 65 10/02/2022   LDLDIRECT 75.0 02/14/2022   TRIG 83.0 10/02/2022   CHOLHDL 3 10/02/2022  A/P: LDL has been well controlled - likely continue current medications but confirm today with direct LDL     # Hyperglycemia/insulin resistance/prediabetes-A1c 6.22 June 2020 S:  Medication: Metformin 250 mg Exercise and diet- Monday or Tuesday goes to [facility] and pilates on Thursdays and some yard work. Eating reasonably well and tried to reduce portions Lab Results  Component Value Date   HGBA1C 5.9 10/02/2022   HGBA1C 6.1 02/14/2022   HGBA1C 6.1 08/17/2021  A/P: hopefully stable- update a1c today. Continue current meds for now    #OCD- long term Lexapro 10 mg- anxiety related to OCD primarily - feels like well controlled   #strabismus- detected by Dr. Dione Booze- referral to Dr. Karoline Caldwell [facility] - scheduled in April 2025. Rotation seems to be biggest issue.   #allergies- OTC (available over the counter without a prescription) antihistamine helps him and he does use cough drops  Recommended follow up: Return in about 6 months (around 10/01/2023) for physical or sooner if needed.Schedule b4 you leave. Future Appointments  Date Time Provider Department Center  04/16/2023  3:40 PM [facility] ANNUAL WELLNESS VISIT 1 [facility]  04/30/2023 10:30 AM [name], Konrad Dolores, MD [facility] None  10/01/2023  9:15 AM Sherrie George, MD [facility] None    Lab/Order associations:   ICD-10-CM   1. Primary hypertension  I10 Comprehensive metabolic panel    2. Hyperlipidemia, unspecified hyperlipidemia type  E78.5 Comprehensive  metabolic panel    LDL cholesterol, direct    3. Hyperthyroidism  E05.90     4. Obsessive-compulsive disorder, unspecified type  F42.9     5. Hyperglycemia  R73.9 Hemoglobin A1c    6. Screening for diabetes mellitus  Z13.1 Hemoglobin A1c      No orders of the defined types were placed in this encounter.   Return precautions advised.  Tana Conch, MD

## 2023-04-02 ENCOUNTER — Ambulatory Visit: Payer: PPO | Admitting: Family Medicine

## 2023-04-16 ENCOUNTER — Ambulatory Visit (INDEPENDENT_AMBULATORY_CARE_PROVIDER_SITE_OTHER): Payer: PPO

## 2023-04-16 VITALS — Ht 68.5 in | Wt 172.0 lb

## 2023-04-16 DIAGNOSIS — Z0000 Encounter for general adult medical examination without abnormal findings: Secondary | ICD-10-CM

## 2023-04-16 NOTE — Patient Instructions (Signed)
 Darius Long , Thank you for taking time to come for your Medicare Wellness Visit. I appreciate your ongoing commitment to your health goals. Please review the following plan we discussed and let me know if I can assist you in the future.   Referrals/Orders/Follow-Ups/Clinician Recommendations: Aim for 30 minutes of exercise or brisk walking, 6-8 glasses of water, and 5 servings of fruits and vegetables each day.  This is a list of the screening recommended for you and due dates:  Health Maintenance  Topic Date Due   Medicare Annual Wellness Visit  03/19/2023   COVID-19 Vaccine (9 - 2024-25 season) 04/08/2023   DTaP/Tdap/Td vaccine (2 - Td or Tdap) 05/18/2023   Pneumonia Vaccine  Completed   Flu Shot  Completed   Hepatitis C Screening  Completed   Zoster (Shingles) Vaccine  Completed   HPV Vaccine  Aged Out   Colon Cancer Screening  Discontinued    Advanced directives: (In Chart) A copy of your advanced directives are scanned into your chart should your provider ever need it.  Next Medicare Annual Wellness Visit scheduled for next year: Yes

## 2023-04-16 NOTE — Progress Notes (Signed)
 Subjective:   Darius Long. is a 78 y.o. who presents for a Medicare Wellness preventive visit.  Visit Complete: Virtual I connected with  Darius Long. on 04/16/23 by a audio enabled telemedicine application and verified that I am speaking with the correct person using two identifiers.  Patient Location: Home  Provider Location: Home Office  I discussed the limitations of evaluation and management by telemedicine. The patient expressed understanding and agreed to proceed.  Vital Signs: Because this visit was a virtual/telehealth visit, some criteria may be missing or patient reported. Any vitals not documented were not able to be obtained and vitals that have been documented are patient reported.  VideoDeclined- This patient declined Librarian, academic. Therefore the visit was completed with audio only.  Persons Participating in Visit: Patient.  AWV Questionnaire: No: Patient Medicare AWV questionnaire was not completed prior to this visit.  Cardiac Risk Factors include: advanced age (>60men, >26 women);hypertension;dyslipidemia;male gender     Objective:    Today's Vitals   04/16/23 1528  Weight: 172 lb (78 kg)  Height: 5' 8.5" (1.74 m)   Body mass index is 25.77 kg/m.     04/16/2023    3:35 PM 03/18/2022    1:23 PM 03/09/2021    1:10 PM 03/03/2020    1:10 PM 12/01/2018   11:54 AM  Advanced Directives  Does Patient Have a Medical Advance Directive? Yes Yes Yes Yes Yes  Type of Estate agent of [place];Living will Healthcare Power of [place];Living will Healthcare Power of [employer] Power of Attorney Living will;Healthcare Power of Attorney  Does patient want to make changes to medical advance directive? No - Patient declined No - Patient declined   No - Patient declined  Copy of Healthcare Power of Attorney in Chart? Yes - validated most recent copy scanned in chart (See row information) Yes  - validated most recent copy scanned in chart (See row information) Yes - validated most recent copy scanned in chart (See row information) Yes - validated most recent copy scanned in chart (See row information) Yes - validated most recent copy scanned in chart (See row information)    Current Medications (verified) Outpatient Encounter Medications as of 04/16/2023  Medication Sig   atorvastatin (LIPITOR) 40 MG tablet TAKE 1 TABLET BY MOUTH DAILY   Black Pepper-Turmeric (TURMERIC CURCUMIN) 05-998 MG CAPS Take 1 capsule by mouth daily.   Calcium Citrate-Vitamin D (CITRACAL + D PO) Take 25 mcg by mouth daily.   CHONDROITIN SULFATE PO Take 200 mg by mouth daily.   diclofenac Sodium (VOLTAREN) 1 % GEL APPLY 2 GRAMS TO AFFECTED AREAS EVERY NIGHT AT BEDTIME   escitalopram (LEXAPRO) 10 MG tablet Take 1 tablet (10 mg total) by mouth daily.   Glucosamine HCl 1500 MG TABS Take 1,500 mg by mouth daily. Glucosamine 1005 mg / day, condroitin 134 mg / day, calcium fructoborate 145 mg / day, MSM 1005 mg per day, Hyaluronic acid 2 mg / day   latanoprost (XALATAN) 0.005 % ophthalmic solution Place 1 drop into both eyes at bedtime.   MAGNESIUM PO Take 450 mg by mouth.   metFORMIN (GLUCOPHAGE) 500 MG tablet TAKE 1/2 TABLET BY MOUTH DAILY WITH BREAKFAST   methimazole (TAPAZOLE) 5 MG tablet Take 1 tablet (5 mg total) by mouth as directed. Take 1 tablet Monday through Friday, none on Saturday and Sunday   Omega-3 Fatty Acids (OMEGA-3 FISH OIL PO) Take by mouth. EPA 1380 mg /  day, DHA 520 mg / day   OVER THE COUNTER MEDICATION Take 2 capsules by mouth daily at 8 pm. Calcium 240 mg, vitamin D 100 mcg, Magnesium 120 mg   Probiotic Product (PROBIOTIC ADVANCED PO) Take 2 tablets by mouth daily. 30 billion cfu's   valsartan (DIOVAN) 320 MG tablet TAKE 1 TABLET BY MOUTH DAILY   vitamin E 400 UNIT capsule Take 800 Units by mouth daily.   No facility-administered encounter medications on file as of 04/16/2023.    Allergies  (verified) Patient has no known allergies.   History: Past Medical History:  Diagnosis Date   Anxiety    Arthritis    Attention deficit disorder (ADD) in adult    Adderall 30mg  tablet- half tablet twice a day.    Chicken pox    Colon polyp    dad died of colon cancer- q5 year colonoscopy. 2018   Diabetes mellitus without complication (HCC)    Diverticulosis    Encounter for blood transfusion    related to overtreatment with PCN. became very ill.    Gall stones    Heart murmur    Sees cardiology October each year due to murmur after fall- get records. Seen for heart murmur heard 2010. We discused may not need to see cardiology. Every other year stres test   Hyperglycemia    a1c 5.9 in 03/2017   Hyperlipidemia    atorvastatin 40mg    Hypertension    losartan 50, hctz 12.5mg    Hypothyroidism    levothyroxine 100 mcg 6 days a week as of mid 2019.    Seasonal allergies    prn antihistamine   Past Surgical History:  Procedure Laterality Date   Arthroscopic surgery Left 1986-1996   Damaged cartilage Left knee   BACK SURGERY Bilateral 2008   Injury due to a fall. 2 fractured vertebrae, 3 fractured ribs fracture L scapulla, mild concussion   CHOLECYSTECTOMY  2014   Relocation surgery Right    For right elbow ulna nerve    REPAIR of torn retina Right 2014   TONSILECTOMY, ADENOIDECTOMY, BILATERAL MYRINGOTOMY AND TUBES  1958   vena Laser   2009   Remaoval of varicous veins   Family History  Problem Relation Age of Onset   Arthritis Mother        passed at age 81   Miscarriages / [place] Mother    Hearing loss Father    Colon cancer Father        died at 9   Heart disease Paternal Grandmother    Other Daughter        diabetes educator   Miscarriages / Stillbirths Daughter    Diabetes Mellitus I Daughter        in [place]   Esophageal cancer Neg Hx    Stomach cancer Neg Hx    Colon polyps Neg Hx    Rectal cancer Neg Hx    Social History   Socioeconomic History    Marital status: Married    Spouse name: Not on file   Number of children: 2   Years of education: Not on file   Highest education level: Not on file  Occupational History   Occupation: Retired   Tobacco Use   Smoking status: Never   Smokeless tobacco: Never  Vaping Use   Vaping status: Never Used  Substance and Sexual Activity   Alcohol use: Not Currently   Drug use: Never   Sexual activity: Not Currently  Other Topics Concern  Not on file  Social History Narrative   Married. Twin daughters. 4 grandkids (2 each)- one set local- one in [place] from [place], [place]      Retired from [employer]      Hobbies: time with grandkids, enjoys travel   Social Drivers of Corporate investment banker Strain: Low Risk  (04/16/2023)   Overall Financial Resource Strain (CARDIA)    Difficulty of Paying Living Expenses: Not hard at all  Food Insecurity: No Food Insecurity (04/16/2023)   Hunger Vital Sign    Worried About Running Out of Food in the Last Year: Never true    Ran Out of Food in the Last Year: Never true  Transportation Needs: No Transportation Needs (04/16/2023)   PRAPARE - Administrator, Civil Service (Medical): No    Lack of Transportation (Non-Medical): No  Physical Activity: Insufficiently Active (04/16/2023)   Exercise Vital Sign    Days of Exercise per Week: 1 day    Minutes of Exercise per Session: 90 min  Stress: No Stress Concern Present (04/16/2023)   [employer] of Occupational Health - Occupational Stress Questionnaire    Feeling of Stress : Not at all  Social Connections: Moderately Integrated (04/16/2023)   Social Connection and Isolation Panel [NHANES]    Frequency of Communication with Friends and Family: More than three times a week    Frequency of Social Gatherings with Friends and Family: More than three times a week    Attends Religious Services: More than 4 times per year    Active Member of  [employer] or Organizations: No    Attends Engineer, structural: Never    Marital Status: Married    Tobacco Counseling Counseling given: Not Answered    Clinical Intake:  Pre-visit preparation completed: Yes  Pain : No/denies pain     BMI - recorded: 25.77 Nutritional Status: BMI 25 -29 Overweight Nutritional Risks: None Diabetes: No  Lab Results  Component Value Date   HGBA1C 6.1 03/31/2023   HGBA1C 5.9 10/02/2022   HGBA1C 6.1 02/14/2022     How often do you need to have someone help you when you read instructions, pamphlets, or other written materials from your doctor or pharmacy?: 1 - Never  Interpreter Needed?: No  Information entered by :: Lanier Ensign, LPN   Activities of Daily Living     04/16/2023    3:32 PM  In your present state of health, do you have any difficulty performing the following activities:  Hearing? 0  Vision? 0  Difficulty concentrating or making decisions? 0  Walking or climbing stairs? 0  Dressing or bathing? 0  Doing errands, shopping? 0  Preparing Food and eating ? N  Using the Toilet? N  In the past six months, have you accidently leaked urine? N  Do you have problems with loss of bowel control? N  Managing your Medications? N  Managing your Finances? N  Housekeeping or managing your Housekeeping? N    Patient Care Team: Shelva Majestic, MD as PCP - General (Family Medicine) [facility] as Consulting Physician Croitoru, Rachelle Hora, MD as Consulting Physician (Cardiology) Sherrie George, MD as Consulting Physician (Ophthalmology) Erroll Luna, [facility] (Inactive) (Pharmacist)  Indicate any recent Medical Services you may have received from other than [facility] providers in the past year (date may be approximate).     Assessment:   This is a routine wellness  examination for [facility].  Hearing/Vision screen Hearing Screening - Comments:: Pt denies any hearing issues  Vision Screening - Comments::  Pt  follow up with Dr Dione Booze for annual eye exams, Wears rx glasses - up to date with routine eye exams with, also Follows Dr Karoline Caldwell [facility] for further eye evaluation of right eye concerns     Goals Addressed             This Visit's Progress    Patient Stated       Maintain health and activity        Depression Screen     04/16/2023    3:37 PM 10/02/2022   11:35 AM 03/18/2022    1:22 PM 03/09/2021    1:08 PM 07/13/2020    3:55 PM 03/03/2020    1:08 PM 02/02/2020    9:53 AM  PHQ 2/9 Scores  PHQ - 2 Score 0 0 0 0 0 0 0  PHQ- 9 Score  0         Fall Risk     04/16/2023    3:40 PM 10/02/2022   11:35 AM 03/18/2022    1:24 PM 03/09/2021    1:11 PM 12/18/2020    3:44 PM  Fall Risk   Falls in the past year? 0 0 0 0 0  Number falls in past yr: 0 0 0 0   Injury with Fall? 0 0 0 0   Risk for fall due to : No Fall Risks No Fall Risks Impaired vision Impaired vision   Follow up Falls prevention discussed Falls evaluation completed Falls prevention discussed Falls prevention discussed     MEDICARE RISK AT HOME:  Medicare Risk at Home Any stairs in or around the home?: Yes If so, are there any without handrails?: No Home free of loose throw rugs in walkways, pet beds, electrical cords, etc?: Yes Adequate lighting in your home to reduce risk of falls?: Yes Life alert?: No Use of a cane, walker or w/c?: No Grab bars in the bathroom?: Yes Shower chair or bench in shower?: Yes Elevated toilet seat or a handicapped toilet?: No  TIMED UP AND GO:  Was the test performed?  No  Cognitive Function: 6CIT completed        04/16/2023    3:42 PM 03/18/2022    1:25 PM 03/09/2021    1:12 PM 03/03/2020    1:15 PM 12/01/2018    1:49 PM  6CIT Screen  What Year? 0 points 0 points 0 points 0 points 0 points  What month? 0 points 0 points 0 points 0 points 0 points  What time? 0 points 0 points 0 points  0 points  Count back from 20 0 points 0 points 0 points 0 points 0 points  Months  in reverse 0 points 0 points 0 points 0 points 0 points  Repeat phrase 0 points 0 points 0 points 0 points 0 points  Total Score 0 points 0 points 0 points  0 points    Immunizations Immunization History  Administered Date(s) Administered   Fluad Quad(high Dose 65+) 09/22/2018, 10/13/2019   Fluad Trivalent(High Dose 65+) 10/02/2022   Influenza, High Dose Seasonal PF 10/10/2017, 10/27/2020, 10/12/2021   MMR 04/14/2013   Moderna Covid-19 Fall Seasonal Vaccine 103yrs & older 02/19/2022, 10/09/2022   Moderna Covid-19 Vaccine Bivalent Booster 73yrs & up 05/18/2020   Moderna SARS-COV2 Booster Vaccination 05/18/2020   PFIZER(Purple Top)SARS-COV-2 Vaccination 02/28/2019, 03/25/2019, 10/22/2019, 11/13/2020   PNEUMOCOCCAL CONJUGATE-20  07/13/2020   Pneumococcal Conjugate-13 07/01/2013   Pneumococcal Polysaccharide-23 01/03/2012   Respiratory Syncytial Virus Vaccine,Recomb Aduvanted(Arexvy) 10/19/2021   Tdap 05/17/2013   Zoster Recombinant(Shingrix) 10/12/2017, 02/17/2018   Zoster, Live 08/02/2010    Screening Tests Health Maintenance  Topic Date Due   COVID-19 Vaccine (9 - 2024-25 season) 04/08/2023   DTaP/Tdap/Td (2 - Td or Tdap) 05/18/2023   Medicare Annual Wellness (AWV)  04/15/2024   Pneumonia Vaccine 21+ Years old  Completed   INFLUENZA VACCINE  Completed   Hepatitis C Screening  Completed   Zoster Vaccines- Shingrix  Completed   HPV VACCINES  Aged Out   Colonoscopy  Discontinued    Health Maintenance  Health Maintenance Due  Topic Date Due   COVID-19 Vaccine (9 - 2024-25 season) 04/08/2023   Health Maintenance Items Addressed: See Nurse Notes  Additional Screening:  Vision Screening: Recommended annual ophthalmology exams for early detection of glaucoma and other disorders of the eye.  Dental Screening: Recommended annual dental exams for proper oral hygiene  Community Resource Referral / Chronic Care Management: CRR required this visit?  No   CCM required this  visit?  No     Plan:     I have personally reviewed and noted the following in the patient's chart:   Medical and social history Use of alcohol, tobacco or illicit drugs  Current medications and supplements including opioid prescriptions. Patient is not currently taking opioid prescriptions. Functional ability and status Nutritional status Physical activity Advanced directives List of other physicians Hospitalizations, surgeries, and ER visits in previous 12 months Vitals Screenings to include cognitive, depression, and falls Referrals and appointments  In addition, I have reviewed and discussed with patient certain preventive protocols, quality metrics, and best practice recommendations. A written personalized care plan for preventive services as well as general preventive health recommendations were provided to patient.     Marzella Schlein, LPN   1/61/0960   After Visit Summary: (MyChart) Due to this being a telephonic visit, the after visit summary with patients personalized plan was offered to patient via MyChart   Notes: Nothing significant to report at this time.

## 2023-04-23 DIAGNOSIS — E0500 Thyrotoxicosis with diffuse goiter without thyrotoxic crisis or storm: Secondary | ICD-10-CM | POA: Diagnosis not present

## 2023-04-23 DIAGNOSIS — H5021 Vertical strabismus, right eye: Secondary | ICD-10-CM | POA: Diagnosis not present

## 2023-04-23 DIAGNOSIS — H532 Diplopia: Secondary | ICD-10-CM | POA: Diagnosis not present

## 2023-04-28 DIAGNOSIS — E0500 Thyrotoxicosis with diffuse goiter without thyrotoxic crisis or storm: Secondary | ICD-10-CM | POA: Diagnosis not present

## 2023-04-30 ENCOUNTER — Encounter: Payer: Self-pay | Admitting: Internal Medicine

## 2023-04-30 ENCOUNTER — Ambulatory Visit: Payer: PPO | Admitting: Internal Medicine

## 2023-04-30 VITALS — BP 130/70 | HR 60 | Ht 68.5 in | Wt 172.0 lb

## 2023-04-30 DIAGNOSIS — E0590 Thyrotoxicosis, unspecified without thyrotoxic crisis or storm: Secondary | ICD-10-CM

## 2023-04-30 DIAGNOSIS — E0500 Thyrotoxicosis with diffuse goiter without thyrotoxic crisis or storm: Secondary | ICD-10-CM

## 2023-04-30 NOTE — Progress Notes (Unsigned)
 Name: Darius Long.  MRN/ DOB: [id number], September 26, 1945    Age/ Sex: 78 y.o., male     PCP: Darius Majestic, MD   Reason for Endocrinology Evaluation: Hyperthyroidism     Initial Endocrinology Clinic Visit: 03/15/2020    PATIENT IDENTIFIER: [name] is a 78 y.o., male with a past medical history of HTN, dyslipidemia and OCD. He has followed with [facility] since 03/15/2020 for consultative assistance with management of his Hyperthyroidism      HISTORICAL SUMMARY:  They moved from [facility], [place] in May 2019  He was diagnosed with hypothyroidism ~ 15 yrs ago and was on LT-4  Until 02/03/2019     Over the summer/fall  of 2021 he has noted weight loss as well as decrease muscle mass. His LT-4 replacement has been gradually reduced until fully discontinued  On 02/03/2019   No amiodarone use   Methimazole started 02/2020  TRAB elevated 18.65 iu/L    Thyroid ultrasound showed sub centimeter left nodule that did not meet criteria for follow up 03/2020   No FH of thyroid disease     SUBJECTIVE:     Today (04/30/2023):  [name] is here for Hyperthyroidism and Graves' disease.  Patient has been noted with slight weight loss Denies local neck swelling  Denies palpitations  Denies tremors  Denies diarrhea or constipation  Denies nausea or vomiting   He was referred by Dr. Dione Long to [facility] (Dr. Karoline Long) for further evaluation of diplopia, CT scan of the orbits has been ordered. He has been having visual issues since 07/2022, has washes every morning with baby shampoo and systane     Methimazole 5 mg ,1 tablet  Monday through Wednesday   (none on Thursday, Friday, Saturday or Sunday)    HISTORY:  Past Medical History:  Past Medical History:  Diagnosis Date   Anxiety    Arthritis    Attention deficit disorder (ADD) in adult    Adderall 30mg  tablet- half tablet twice a day.    Chicken pox    Colon polyp     dad died of colon cancer- q5 year colonoscopy. 2018   Diabetes mellitus without complication (HCC)    Diverticulosis    Encounter for blood transfusion    related to overtreatment with PCN. became very ill.    Gall stones    Heart murmur    Sees cardiology October each year due to murmur after fall- get records. Seen for heart murmur heard 2010. We discused may not need to see cardiology. Every other year stres test   Hyperglycemia    a1c 5.9 in 03/2017   Hyperlipidemia    atorvastatin 40mg    Hypertension    losartan 50, hctz 12.5mg    Hypothyroidism    levothyroxine 100 mcg 6 days a week as of mid 2019.    Seasonal allergies    prn antihistamine   Past Surgical History:  Past Surgical History:  Procedure Laterality Date   Arthroscopic surgery Left 1986-1996   Damaged cartilage Left knee   BACK SURGERY Bilateral 2008   Injury due to a fall. 2 fractured vertebrae, 3 fractured ribs fracture L scapulla, mild concussion   CHOLECYSTECTOMY  2014   Relocation surgery Right    For right elbow ulna nerve    REPAIR of torn retina Right 2014   TONSILECTOMY, ADENOIDECTOMY, BILATERAL MYRINGOTOMY AND TUBES  1958   vena Laser   2009   Remaoval  of varicous veins   Social History:  reports that he has never smoked. He has never used smokeless tobacco. He reports that he does not currently use alcohol. He reports that he does not use drugs. Family History:  Family History  Problem Relation Age of Onset   Arthritis Mother        passed at age 53   Miscarriages / [place] Mother    Hearing loss Father    Colon cancer Father        died at 51   Heart disease Paternal Grandmother    Other Daughter        diabetes educator   Miscarriages / Stillbirths Daughter    Diabetes Mellitus I Daughter        in [place]   Esophageal cancer Neg Hx    Stomach cancer Neg Hx    Colon polyps Neg Hx    Rectal cancer Neg Hx      HOME MEDICATIONS: Allergies as of 04/30/2023   No Known Allergies       Medication List        Accurate as of April 30, 2023  6:59 AM. If you have any questions, ask your nurse or doctor.          atorvastatin 40 MG tablet Commonly known as: LIPITOR TAKE 1 TABLET BY MOUTH DAILY   CHONDROITIN SULFATE PO Take 200 mg by mouth daily.   CITRACAL + D PO Take 25 mcg by mouth daily.   diclofenac Sodium 1 % Gel Commonly known as: VOLTAREN APPLY 2 GRAMS TO AFFECTED AREAS EVERY NIGHT AT BEDTIME   escitalopram 10 MG tablet Commonly known as: LEXAPRO Take 1 tablet (10 mg total) by mouth daily.   Glucosamine HCl 1500 MG Tabs Take 1,500 mg by mouth daily. Glucosamine 1005 mg / day, condroitin 134 mg / day, calcium fructoborate 145 mg / day, MSM 1005 mg per day, Hyaluronic acid 2 mg / day   latanoprost 0.005 % ophthalmic solution Commonly known as: XALATAN Place 1 drop into both eyes at bedtime.   MAGNESIUM PO Take 450 mg by mouth.   metFORMIN 500 MG tablet Commonly known as: GLUCOPHAGE TAKE 1/2 TABLET BY MOUTH DAILY WITH BREAKFAST   methimazole 5 MG tablet Commonly known as: TAPAZOLE Take 1 tablet (5 mg total) by mouth as directed. Take 1 tablet Monday through Friday, none on Saturday and Sunday   OMEGA-3 FISH OIL PO Take by mouth. EPA 1380 mg / day, DHA 520 mg / day   OVER THE COUNTER MEDICATION Take 2 capsules by mouth daily at 8 pm. Calcium 240 mg, vitamin D 100 mcg, Magnesium 120 mg   PROBIOTIC ADVANCED PO Take 2 tablets by mouth daily. 30 billion cfu's   Turmeric Curcumin 05-998 MG Caps Take 1 capsule by mouth daily.   valsartan 320 MG tablet Commonly known as: DIOVAN TAKE 1 TABLET BY MOUTH DAILY   vitamin E 180 MG (400 UNITS) capsule Take 800 Units by mouth daily.          OBJECTIVE:   PHYSICAL EXAM: VS: There were no vitals taken for this visit.   EXAM: General: Pt appears well and is in NAD  Neck: General: Supple without adenopathy. Thyroid: Left thyroid nodule appreciated  Lungs: Clear with good BS bilat    Heart: Auscultation: RRR.  Extremities:  BL LE: No pretibial edema   Mental Status: Judgment, insight: Intact Orientation: Oriented to time, place, and person Mood and affect: No depression,  anxiety, or agitation     DATA REVIEWED:  Latest Reference Range & Units 11/12/22 10:57  TSH 0.35 - 5.50 uIU/mL 2.07  Triiodothyronine,Free,Serum 2.3 - 4.2 pg/mL 3.0  T4,Free(Direct) 0.60 - 1.60 ng/dL 4.09    Results for ARAV, BANNISTER (MRN [id number]) as of 06/12/2020 09:57  Ref. Range 03/15/2020 12:16  TRAB Latest Ref Range: <=2.00 IU/L 18.65 (H)   Thyroid ULtrasound 04/04/2020    Parenchymal Echotexture: Moderately heterogenous   Isthmus: 0.4 cm   Right lobe: 4.8 x 2.3 x 2.0 cm   Left lobe: 4.1 x 2.2 x 1.7 cm   _________________________________________________________   Estimated total number of nodules >/= 1 cm: 0   Number of spongiform nodules >/=  2 cm not described below (TR1): 0   Number of mixed cystic and solid nodules >/= 1.5 cm not described below (TR2): 0   _________________________________________________________   Nodule # 1:   Location: Left; Superior   Maximum size: 0.7 cm; Other 2 dimensions: 0.6 x 0.5 cm   Composition: solid/almost completely solid (2)   Echogenicity: isoechoic (1)   Shape: not taller-than-wide (0)   Margins: smooth (0)   Echogenic foci: none (0)   ACR TI-RADS total points: 3.   ACR TI-RADS risk category: TR3 (3 points).   ACR TI-RADS recommendations:   Given size (<1.4 cm) and appearance, this nodule does NOT meet TI-RADS criteria for biopsy or dedicated follow-up.   _________________________________________________________   IMPRESSION: 1. Moderately heterogeneous, normal-sized thyroid gland. 2. Single solid left upper thyroid nodule (labeled 1, 0.7 cm) which does not meet criteria (TI-RADS category 3) for dedicated ultrasound follow-up or tissue sampling.   The above is in keeping with the ACR TI-RADS  recommendations - J Am Coll Radiol 2017;14:587-595.   Marliss Coots, MD   [facility]   [facility]     ASSESSMENT / PLAN / RECOMMENDATIONS:   Hyperthyroidism:   -Patient with Graves' disease but he also has a left thyroid nodule which could also be contributing to hyperthyroidism -Patient is clinically euthyroid -No local neck symptoms -Historically he has been noted with sensitivity to methimazole changes. -TFTs remain within normal range, will decrease methimazole as below    Medications  Decrease methimazole 5 mg, 1  tablet Monday through Wednesday (none on Thursday, Friday, Saturday and Sunday)    2. Graves' Disease :  -Right eye proptosis noted, under the care of ophthalmology     3. Left Thyroid Nodule:  -No local neck symptoms -This is too small to follow-up  Follow-up in 6 months   Labs in 2 months   Signed electronically by: Lyndle Herrlich, MD  [facility]  [facility] [address] [place], [place] [address] Phone: [phone] FAX: [phone]      CC: Darius Majestic, MD [address] [place] [address] Phone: [phone]  Fax: [phone]   Return to Endocrinology clinic as below: Future Appointments  Date Time Provider Department Center  04/30/2023 10:30 AM Deniz Hannan, Konrad Dolores, MD [facility] None  10/01/2023  9:15 AM Sherrie George, MD [facility] None  10/22/2023 10:00 AM Darius Majestic, MD [facility]  04/22/2024  1:00 PM [facility] ANNUAL WELLNESS VISIT 1 [facility]

## 2023-05-01 ENCOUNTER — Encounter: Payer: Self-pay | Admitting: Internal Medicine

## 2023-05-01 LAB — TSH: TSH: 2.88 m[IU]/L (ref 0.40–4.50)

## 2023-05-01 LAB — T3, FREE: T3, Free: 3 pg/mL (ref 2.3–4.2)

## 2023-05-01 LAB — T4, FREE: Free T4: 1 ng/dL (ref 0.8–1.8)

## 2023-05-01 MED ORDER — METHIMAZOLE 5 MG PO TABS: 5.0000 mg | ORAL_TABLET | ORAL | 3 refills | Status: DC

## 2023-06-04 ENCOUNTER — Encounter: Payer: Self-pay | Admitting: Family Medicine

## 2023-06-05 ENCOUNTER — Telehealth: Admitting: Family Medicine

## 2023-06-05 ENCOUNTER — Encounter: Payer: Self-pay | Admitting: Family Medicine

## 2023-06-05 ENCOUNTER — Telehealth: Payer: Self-pay

## 2023-06-05 DIAGNOSIS — E785 Hyperlipidemia, unspecified: Secondary | ICD-10-CM | POA: Diagnosis not present

## 2023-06-05 DIAGNOSIS — U071 COVID-19: Secondary | ICD-10-CM

## 2023-06-05 MED ORDER — NIRMATRELVIR/RITONAVIR (PAXLOVID)TABLET: 3.0000 | ORAL_TABLET | Freq: Two times a day (BID) | ORAL | 0 refills | Status: AC

## 2023-06-05 NOTE — Telephone Encounter (Signed)
 Pt did not want to schedule at this time. Is going to do an urgent vv instead.

## 2023-06-05 NOTE — Telephone Encounter (Signed)
 Pt is scheduled to see Dr. Daneil Dunker for ov today.  Copied from CRM [id number]. Topic: Clinical - Medical Advice >> Jun 05, 2023  7:39 AM [place] A wrote: Reason for CRM: Patient called in to follow up on medication request for Covid-please contact

## 2023-06-05 NOTE — Progress Notes (Signed)
   Darius Long. is a 78 y.o. male who presents today for a virtual office visit.  Assessment/Plan:  COVID No red flags.  Discussed limitations of virtual visit and inability to perform physical exam.  Overall symptoms are currently mild however he does have several risk factors that may complicate his course of illness.  Additionally he will be traveling again soon and desires a quick recovery as he will be spending time with other individuals who are immunocompromised.  He has done well with paxlovid  in the past and would like to have another prescription for this today.  Will send this in.  Advised him to hold his atorvastatin  while on this.  Encouraged hydration.  He can use over-the-counter meds as needed as well.  Dyslipidemia On Lipitor 40 mg daily.  Will hold this while on paxlovid .     Subjective:  HPI:  See assessment / plan for status of chronic conditions. Patient here with COVID. Symptoms started a couple of days ago after being on a trip from [place].  Symptoms include cough and fatigue. Feeling much better today than he did yesterday. No fevers or chills. No chest pain or shortness of breath.  Wife has been sick with similar symptoms as well.  He has upcoming trip next week.  He had COVID a couple of years ago and was prescribed Paxlovid .  He did well with this.       Objective/Observations  Physical Exam: Gen: NAD, resting comfortably Pulm: Normal work of breathing Neuro: Grossly normal, moves all extremities Psych: Normal affect and thought content  Virtual Visit via Video   I connected with Darius Long. on 06/05/23 at 11:00 AM EDT by a video enabled telemedicine application and verified that I am speaking with the correct person using two identifiers. The limitations of evaluation and management by telemedicine and the availability of in person appointments were discussed. The patient expressed understanding and agreed to proceed.   Patient  location: Home Provider location: [place] [employer] Persons participating in the virtual visit: Myself and Patient     Darius Long. Darius Dunker, MD 06/05/2023 11:19 AM

## 2023-06-05 NOTE — Telephone Encounter (Signed)
 Clinical call from Access Nurse -Tested Covid+, wants rx asap for it.  Patient Name First: Darius Last: Long Gender: Male DOB: 03-11-1945 Age: 78 Y 9 M 8 D Return Phone Number: [phone] (Primary), [phone] (Secondary) Address: City/ State/ Zip: [place]  [address] Client [facility] Night - Human resources officer [facility] Night Provider Clarisa Crooked- MD Contact Type Call Who Is Calling Patient / Member / Family / Caregiver Call Type Triage / Clinical Caller Name Ronny Colas Phone Number [phone] Relationship To Patient Spouse Return Phone Number [phone] (Primary) Chief Complaint Unclassified Symptom Reason for Call Symptomatic / Request for Health Information Initial Comment Caller states they just got back from out of the country and just tested positive for covid and is getting ready to leave again Monday so is needing an rx asap. Caller states he is run down, lack of energy, sneezing, drippy nose. Translation No No Triage Reason Client directive prohibits Nurse Assessment Nurse: Lovena Rubinstein, RN, Ace Abu Date/Time [facility] Time): 06/04/2023 7:44:04 PM Confirm and document reason for call. If symptomatic, describe symptoms. ---Caller states he jut got back from the country and tested positive for covid and going to leave again for Monday. Patient would like medication to treat their covid. Does the patient have any new or worsening symptoms? ---Yes Will a triage be completed? ---No Select reason for no triage. ---Client directive prohibits Disp. Time [facility] Time) Disposition Final User 06/04/2023 7:48:43 PM Clinical Call Yes Lovena Rubinstein, RN, Ace Abu Final Disposition 06/04/2023 7:48:43 PM Clinical Call Yes Lovena Rubinstein, RN, Ace Abu

## 2023-07-01 DIAGNOSIS — H532 Diplopia: Secondary | ICD-10-CM | POA: Diagnosis not present

## 2023-07-01 DIAGNOSIS — H2513 Age-related nuclear cataract, bilateral: Secondary | ICD-10-CM | POA: Diagnosis not present

## 2023-07-01 DIAGNOSIS — H5021 Vertical strabismus, right eye: Secondary | ICD-10-CM | POA: Diagnosis not present

## 2023-07-01 DIAGNOSIS — E0500 Thyrotoxicosis with diffuse goiter without thyrotoxic crisis or storm: Secondary | ICD-10-CM | POA: Diagnosis not present

## 2023-07-11 ENCOUNTER — Other Ambulatory Visit

## 2023-07-11 ENCOUNTER — Encounter: Payer: Self-pay | Admitting: Internal Medicine

## 2023-07-11 DIAGNOSIS — E0590 Thyrotoxicosis, unspecified without thyrotoxic crisis or storm: Secondary | ICD-10-CM | POA: Diagnosis not present

## 2023-07-12 LAB — TSH: TSH: 1.84 m[IU]/L (ref 0.40–4.50)

## 2023-07-12 LAB — T4, FREE: Free T4: 0.9 ng/dL (ref 0.8–1.8)

## 2023-07-14 ENCOUNTER — Other Ambulatory Visit: Payer: Self-pay | Admitting: Family Medicine

## 2023-07-15 DIAGNOSIS — E0500 Thyrotoxicosis with diffuse goiter without thyrotoxic crisis or storm: Secondary | ICD-10-CM | POA: Diagnosis not present

## 2023-07-15 DIAGNOSIS — H903 Sensorineural hearing loss, bilateral: Secondary | ICD-10-CM | POA: Diagnosis not present

## 2023-07-16 ENCOUNTER — Other Ambulatory Visit

## 2023-07-30 DIAGNOSIS — H532 Diplopia: Secondary | ICD-10-CM | POA: Diagnosis not present

## 2023-07-30 DIAGNOSIS — H2513 Age-related nuclear cataract, bilateral: Secondary | ICD-10-CM | POA: Diagnosis not present

## 2023-07-30 DIAGNOSIS — E119 Type 2 diabetes mellitus without complications: Secondary | ICD-10-CM | POA: Diagnosis not present

## 2023-07-30 DIAGNOSIS — E0500 Thyrotoxicosis with diffuse goiter without thyrotoxic crisis or storm: Secondary | ICD-10-CM | POA: Diagnosis not present

## 2023-07-30 DIAGNOSIS — H40013 Open angle with borderline findings, low risk, bilateral: Secondary | ICD-10-CM | POA: Diagnosis not present

## 2023-07-30 DIAGNOSIS — E079 Disorder of thyroid, unspecified: Secondary | ICD-10-CM | POA: Diagnosis not present

## 2023-07-30 DIAGNOSIS — H43813 Vitreous degeneration, bilateral: Secondary | ICD-10-CM | POA: Diagnosis not present

## 2023-07-30 LAB — HM DIABETES EYE EXAM

## 2023-08-05 DIAGNOSIS — E0500 Thyrotoxicosis with diffuse goiter without thyrotoxic crisis or storm: Secondary | ICD-10-CM | POA: Diagnosis not present

## 2023-08-11 DIAGNOSIS — R202 Paresthesia of skin: Secondary | ICD-10-CM | POA: Diagnosis not present

## 2023-08-11 DIAGNOSIS — M792 Neuralgia and neuritis, unspecified: Secondary | ICD-10-CM | POA: Diagnosis not present

## 2023-08-11 DIAGNOSIS — M25531 Pain in right wrist: Secondary | ICD-10-CM | POA: Diagnosis not present

## 2023-08-11 DIAGNOSIS — G5601 Carpal tunnel syndrome, right upper limb: Secondary | ICD-10-CM | POA: Diagnosis not present

## 2023-08-11 DIAGNOSIS — M25541 Pain in joints of right hand: Secondary | ICD-10-CM | POA: Diagnosis not present

## 2023-08-25 ENCOUNTER — Other Ambulatory Visit: Payer: Self-pay

## 2023-08-25 ENCOUNTER — Encounter: Payer: Self-pay | Admitting: Family Medicine

## 2023-08-25 MED ORDER — METFORMIN HCL 500 MG PO TABS: ORAL_TABLET | ORAL | 3 refills | Status: AC

## 2023-08-26 DIAGNOSIS — E0500 Thyrotoxicosis with diffuse goiter without thyrotoxic crisis or storm: Secondary | ICD-10-CM | POA: Diagnosis not present

## 2023-09-16 DIAGNOSIS — E0500 Thyrotoxicosis with diffuse goiter without thyrotoxic crisis or storm: Secondary | ICD-10-CM | POA: Diagnosis not present

## 2023-09-25 ENCOUNTER — Encounter: Payer: Self-pay | Admitting: Family Medicine

## 2023-09-30 DIAGNOSIS — H532 Diplopia: Secondary | ICD-10-CM | POA: Diagnosis not present

## 2023-09-30 DIAGNOSIS — H2513 Age-related nuclear cataract, bilateral: Secondary | ICD-10-CM | POA: Diagnosis not present

## 2023-09-30 DIAGNOSIS — E0500 Thyrotoxicosis with diffuse goiter without thyrotoxic crisis or storm: Secondary | ICD-10-CM | POA: Diagnosis not present

## 2023-09-30 DIAGNOSIS — H5021 Vertical strabismus, right eye: Secondary | ICD-10-CM | POA: Diagnosis not present

## 2023-09-30 DIAGNOSIS — H3581 Retinal edema: Secondary | ICD-10-CM | POA: Diagnosis not present

## 2023-10-01 ENCOUNTER — Encounter (INDEPENDENT_AMBULATORY_CARE_PROVIDER_SITE_OTHER): Payer: PPO | Admitting: Ophthalmology

## 2023-10-01 DIAGNOSIS — H43813 Vitreous degeneration, bilateral: Secondary | ICD-10-CM

## 2023-10-01 DIAGNOSIS — H35033 Hypertensive retinopathy, bilateral: Secondary | ICD-10-CM | POA: Diagnosis not present

## 2023-10-01 DIAGNOSIS — H33301 Unspecified retinal break, right eye: Secondary | ICD-10-CM

## 2023-10-01 DIAGNOSIS — I10 Essential (primary) hypertension: Secondary | ICD-10-CM

## 2023-10-02 DIAGNOSIS — Z9889 Other specified postprocedural states: Secondary | ICD-10-CM | POA: Diagnosis not present

## 2023-10-02 DIAGNOSIS — M25552 Pain in left hip: Secondary | ICD-10-CM | POA: Diagnosis not present

## 2023-10-02 DIAGNOSIS — M9905 Segmental and somatic dysfunction of pelvic region: Secondary | ICD-10-CM | POA: Diagnosis not present

## 2023-10-02 DIAGNOSIS — M9906 Segmental and somatic dysfunction of lower extremity: Secondary | ICD-10-CM | POA: Diagnosis not present

## 2023-10-02 DIAGNOSIS — M9908 Segmental and somatic dysfunction of rib cage: Secondary | ICD-10-CM | POA: Diagnosis not present

## 2023-10-02 DIAGNOSIS — M9903 Segmental and somatic dysfunction of lumbar region: Secondary | ICD-10-CM | POA: Diagnosis not present

## 2023-10-02 DIAGNOSIS — R293 Abnormal posture: Secondary | ICD-10-CM | POA: Diagnosis not present

## 2023-10-02 DIAGNOSIS — M9904 Segmental and somatic dysfunction of sacral region: Secondary | ICD-10-CM | POA: Diagnosis not present

## 2023-10-02 DIAGNOSIS — M9902 Segmental and somatic dysfunction of thoracic region: Secondary | ICD-10-CM | POA: Diagnosis not present

## 2023-10-02 DIAGNOSIS — M25531 Pain in right wrist: Secondary | ICD-10-CM | POA: Diagnosis not present

## 2023-10-07 DIAGNOSIS — E0500 Thyrotoxicosis with diffuse goiter without thyrotoxic crisis or storm: Secondary | ICD-10-CM | POA: Diagnosis not present

## 2023-10-19 ENCOUNTER — Encounter: Payer: Self-pay | Admitting: Family Medicine

## 2023-10-22 ENCOUNTER — Ambulatory Visit (INDEPENDENT_AMBULATORY_CARE_PROVIDER_SITE_OTHER): Admitting: Family Medicine

## 2023-10-22 ENCOUNTER — Encounter: Payer: Self-pay | Admitting: Family Medicine

## 2023-10-22 VITALS — BP 132/68 | HR 48 | Temp 97.3°F | Ht 68.5 in | Wt 171.6 lb

## 2023-10-22 DIAGNOSIS — I10 Essential (primary) hypertension: Secondary | ICD-10-CM | POA: Diagnosis not present

## 2023-10-22 DIAGNOSIS — Z131 Encounter for screening for diabetes mellitus: Secondary | ICD-10-CM | POA: Diagnosis not present

## 2023-10-22 DIAGNOSIS — E785 Hyperlipidemia, unspecified: Secondary | ICD-10-CM | POA: Diagnosis not present

## 2023-10-22 DIAGNOSIS — Z0000 Encounter for general adult medical examination without abnormal findings: Secondary | ICD-10-CM

## 2023-10-22 DIAGNOSIS — E0590 Thyrotoxicosis, unspecified without thyrotoxic crisis or storm: Secondary | ICD-10-CM | POA: Diagnosis not present

## 2023-10-22 DIAGNOSIS — R7303 Prediabetes: Secondary | ICD-10-CM | POA: Diagnosis not present

## 2023-10-22 LAB — URINALYSIS, ROUTINE W REFLEX MICROSCOPIC
Bilirubin Urine: NEGATIVE
Hgb urine dipstick: NEGATIVE
Ketones, ur: NEGATIVE
Leukocytes,Ua: NEGATIVE
Nitrite: NEGATIVE
RBC / HPF: NONE SEEN (ref 0–?)
Specific Gravity, Urine: 1.02 (ref 1.000–1.030)
Total Protein, Urine: NEGATIVE
Urine Glucose: NEGATIVE
Urobilinogen, UA: 0.2 (ref 0.0–1.0)
pH: 6 (ref 5.0–8.0)

## 2023-10-22 NOTE — Progress Notes (Signed)
 Phone: [phone]   Subjective:  Patient presents today for their annual physical. Chief complaint-noted.   See problem oriented charting- ROS- full  review of systems was completed and negative  except for topics noted under acute/chronic concerns  The following were reviewed and entered/updated in epic: Past Medical History:  Diagnosis Date   Anxiety    Arthritis    Attention deficit disorder (ADD) in adult    Adderall 30mg  tablet- half tablet twice a day.    Chicken pox    Colon polyp    dad died of colon cancer- q5 year colonoscopy. 2018   Diabetes mellitus without complication (HCC)    Diverticulosis    Encounter for blood transfusion    related to overtreatment with PCN. became very ill.    Gall stones    Heart murmur    Sees cardiology October each year due to murmur after fall- get records. Seen for heart murmur heard 2010. We discused may not need to see cardiology. Every other year stres test   Hyperglycemia    a1c 5.9 in 03/2017   Hyperlipidemia    atorvastatin  40mg    Hypertension    losartan  50, hctz 12.5mg    Hypothyroidism    levothyroxine  100 mcg 6 days a week as of mid 2019.    Seasonal allergies    prn antihistamine   Patient Active Problem List   Diagnosis Date Noted   Hyperthyroidism 03/16/2020    Priority: High   OCD (obsessive compulsive disorder) 01/01/2018    Priority: High   Attention deficit disorder (ADD) in adult     Priority: High   Heart murmur     Priority: High   Nocturia 08/17/2021    Priority: Medium    Arthritis 07/07/2017    Priority: Medium    Hypertension     Priority: Medium    Hyperlipidemia     Priority: Medium    Hyperglycemia     Priority: Medium    Strain of right tibialis anterior muscle 10/30/2017    Priority: Low   Seasonal allergies     Priority: Low   Colon polyp     Priority: Low   Acute viral sinusitis 12/18/2020   Past Surgical History:  Procedure Laterality Date   Arthroscopic surgery Left  1986-1996   Damaged cartilage Left knee   BACK SURGERY Bilateral 2008   Injury due to a fall. 2 fractured vertebrae, 3 fractured ribs fracture L scapulla, mild concussion   CHOLECYSTECTOMY  2014   Relocation surgery Right    For right elbow ulna nerve    REPAIR of torn retina Right 2014   TONSILECTOMY, ADENOIDECTOMY, BILATERAL MYRINGOTOMY AND TUBES  1958   vena Laser   2009   Remaoval of varicous veins    Family History  Problem Relation Age of Onset   Arthritis Mother        passed at age 41   Miscarriages / [place] Mother    Hearing loss Father    Colon cancer Father        died at 79   Heart disease Paternal Grandmother    Other Daughter        diabetes educator   Miscarriages / [place] Daughter    Diabetes Mellitus I Daughter        in [place]    Esophageal cancer Neg Hx    Stomach cancer Neg Hx    Colon polyps Neg Hx    Rectal cancer Neg Hx  Medications- reviewed and updated Current Outpatient Medications  Medication Sig Dispense Refill   atorvastatin  (LIPITOR) 40 MG tablet TAKE 1 TABLET BY MOUTH DAILY 90 tablet 3   CHONDROITIN SULFATE PO Take 200 mg by mouth daily.     diclofenac  Sodium (VOLTAREN ) 1 % GEL APPLY 2 GRAMS TO AFFECTED AREAS EVERY NIGHT AT BEDTIME 350 g 3   escitalopram  (LEXAPRO ) 10 MG tablet Take 1 tablet (10 mg total) by mouth daily. 90 tablet 3   latanoprost (XALATAN) 0.005 % ophthalmic solution Place 1 drop into both eyes at bedtime.     metFORMIN  (GLUCOPHAGE ) 500 MG tablet TAKE 1/2 TABLET BY MOUTH DAILY WITH BREAKFAST 45 tablet 3   methimazole  (TAPAZOLE ) 5 MG tablet Take 1 tablet (5 mg total) by mouth as directed. 1 tablet twice weekly 26 tablet 3   Probiotic Product (PROBIOTIC ADVANCED PO) Take 2 tablets by mouth daily. 30 billion cfu's     Turmeric, Curcuma Longa, POWD Take 1 capsule by mouth daily.     valsartan  (DIOVAN ) 320 MG tablet TAKE 1 TABLET BY MOUTH DAILY 90 tablet 3   vitamin E 400 UNIT capsule Take 800 Units by mouth daily.      No current facility-administered medications for this visit.    Allergies-reviewed and updated Allergies  Allergen Reactions   Gramineae Pollens Other (See Comments)    Social History   Social History Narrative   Married. Twin daughters. 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 Oropharynx normal Neck: no thyromegaly CV: mildly bradycardic but regular faint stable murmur Lungs: CTAB no crackles, wheeze, rhonchi Abdomen: soft/nontender/nondistended/normal bowel sounds. No rebound or guarding.  Ext: no edema Skin: warm, dry Neuro: grossly normal, moves all extremities, PERRLA   Assessment and Plan  78 y.o. male presenting for annual physical.  Health Maintenance counseling: 1. Anticipatory guidance: Patient counseled regarding regular dental exams -q6 months, eye exams - very regularlyy,  avoiding smoking and second hand smoke , limiting alcohol to 2 beverages per day - rare social , no illicit drugs .   2. Risk factor reduction:  Advised patient of need for regular exercise and diet rich and fruits and vegetables to reduce risk of heart attack and stroke.  Exercise- still doing pilates on Thursday, separate day full body strength on Tuesday most of the time.  Diet/weight management-weight down slightly in last year- trying to eat well/less.  Wt Readings from Last 3 Encounters:  10/22/23 171 lb 9.6 oz (77.8 kg)  04/30/23 172 lb (78 kg)  04/16/23 172 lb (78 kg)   3. Immunizations/screenings/ancillary studies-plans Td  at pharmacy, flu shot already had this , COVID-vaccine plans on later after eye treatments   Immunization History  Administered Date(s) Administered   Fluad Quad(high Dose 65+) 09/22/2018, 10/13/2019   Fluad Trivalent(High Dose 65+) 10/02/2022   INFLUENZA, HIGH DOSE SEASONAL PF 10/31/2014, 10/16/2015, 09/27/2016, 10/10/2017, 10/27/2020, 10/12/2021   Influenza, Seasonal,  Injecte, Preservative Fre 11/17/2010, 10/12/2012, 10/25/2013   MMR 04/14/2013   Moderna Covid-19 Fall Seasonal Vaccine 25yrs & older 02/19/2022, 10/09/2022   Moderna Covid-19 Vaccine Bivalent Booster 7yrs & up 05/18/2020   Moderna SARS-COV2 Booster Vaccination 05/18/2020   PFIZER(Purple Top)SARS-COV-2 Vaccination 02/28/2019, 03/25/2019, 10/22/2019, 11/13/2020   PNEUMOCOCCAL CONJUGATE-20 07/13/2020   Pneumococcal Conjugate-13 07/01/2013   Pneumococcal Polysaccharide-23 01/03/2012   Respiratory Syncytial Virus Vaccine,Recomb Aduvanted(Arexvy) 10/19/2021   Tdap 02/15/2004, 05/17/2013   Zoster Recombinant(Shingrix) 10/12/2017, 02/17/2018   Zoster, Live 07/21/2008, 08/02/2010  4. Prostate cancer screening-  low risk prior trend- he opts out at this point as passed age based screening protocol Lab Results  Component Value Date   PSA 1.01 10/02/2022   PSA 0.94 08/17/2021   PSA 0.94 07/13/2020   5. Colon cancer screening - colonoscopy November 27, 2021 with no repeat due to age per gastroenterology 6. Skin cancer screening-dermatology yearly. advised regular sunscreen use. Denies worrisome, changing, or new skin lesions.  7. Smoking associated screening (lung cancer screening, AAA screen 65-75, UA)-never smoker 8. STD screening -only active with wife  Status of chronic or acute concerns   # Right eye issues-last year was referred to Dr. Ronnald Blanch at [facility] with concern about intraocular muscle but didn't take his insurance and eventually saw Dr. Addie Redfern strabismus specialist and then referred to  Dr. Herby reports- ultimately was diagnosed with thyroid  eye disease and is receiving treatment with tepezza  infusions with Dr. Loreli- will get 8 total with last planned December 2nd 2025 -bulge improving but so far still with double vision but glasses/prism  helpful - they did audiogram and will have another at end of treatment as can affect high frequency range  #orthopedic issues- has had injection in wrist with Dr. Marquette- helpful  # Heart murmur S: previously seen cardiology each October. Echo from 10/2016 showed mild mitral regurgitation only. Only mild abnormalities on echo 01/02/2018 -As long as murmur is faint and he has no symptoms he wants to hold off on further cardiology evaluation or echocardiogram A/P: stable faint murmur- continue to monitor   # Graves' disease-follows with Dr. Sam with history of hypothyroidism S: Medication: Methimazole  5 mg once a week right now  A/P: reports down to once a week- update TSH, t3, t3    #hypertension S: medication: Valsartan  320 mg  - November 2023 suspended hydrochlorothiazide  12.5 mg due to frequent urination- found helpful A/P: well controlled continue current medications   #hyperlipidemia-lipids above goal despite max dose statin S: Medication:Atorvastatin  40 mg -History of LFT elevation Lab Results  Component Value Date   CHOL 126 10/02/2022   HDL 44.50 10/02/2022   LDLCALC 65 10/02/2022   LDLDIRECT 73.0 03/31/2023   TRIG 83.0 10/02/2022   CHOLHDL 3 10/02/2022  A/P: hopefully stable- update lipid and LFT today. Continue current meds for now      # Hyperglycemia/insulin resistance/prediabetes-A1c 6.22 June 2020 S:  Medication: Metformin  250 mg Lab Results  Component Value Date   HGBA1C 6.1 03/31/2023   HGBA1C 5.9 10/02/2022   HGBA1C 6.1 02/14/2022  A/P: hopefully stable- update a1c today. Continue current meds for now   #OCD- long term Lexapro  10 mg- anxiety related to OCD primarily   Recommended follow up: Return  in about 6 months (around 04/21/2024) for followup or sooner if needed.Schedule b4 you leave. Future Appointments  Date Time Provider Department Center  10/29/2023 10:30 AM [name], Donell Cardinal, MD [facility] None  04/22/2024  1:00 PM [facility] ANNUAL  WELLNESS VISIT 1 [facility] Milian  09/30/2024  9:15 AM Alvia Norleen BIRCH, MD [facility] None    Lab/Order associations: fasting   ICD-10-CM   1. Preventative health care  Z00.00     2. Primary hypertension  I10 Urinalysis, Routine w reflex microscopic    3. Hyperlipidemia, unspecified hyperlipidemia type  E78.5 Comprehensive metabolic panel with GFR    CBC with Differential/Platelet    Lipid panel    4. Hyperthyroidism  E05.90 TSH    T4, free    T3, free    5. Prediabetes  R73.03 Hemoglobin A1c    6. Screening for diabetes mellitus  Z13.1 Hemoglobin A1c      No orders of the defined types were placed in this encounter.   Return precautions advised.  Garnette Lukes, MD

## 2023-10-22 NOTE — Patient Instructions (Addendum)
 Tetanus, Diphtheria, and Pertussis (Tdap) at pharmacy  Please stop by lab before you go If you have mychart- we will send your results within 3 business days of us  receiving them.  If you do not have mychart- we will call you about results within 5 business days of us  receiving them.  *please also note that you will see labs on mychart as soon as they post. I will later go in and write notes on them- will say notes from Dr. Katrinka   No changes today unless labs lead us  to make changes  Recommended follow up: Return in about 6 months (around 04/21/2024) for followup or sooner if needed.Schedule b4 you leave.

## 2023-10-23 ENCOUNTER — Ambulatory Visit: Payer: Self-pay | Admitting: Family Medicine

## 2023-10-23 ENCOUNTER — Encounter: Payer: Self-pay | Admitting: Family Medicine

## 2023-10-23 DIAGNOSIS — R945 Abnormal results of liver function studies: Secondary | ICD-10-CM

## 2023-10-23 LAB — CBC WITH DIFFERENTIAL/PLATELET
Basophils Absolute: 0.1 K/uL (ref 0.0–0.1)
Basophils Relative: 1.3 % (ref 0.0–3.0)
Eosinophils Absolute: 0.1 K/uL (ref 0.0–0.7)
Eosinophils Relative: 2.1 % (ref 0.0–5.0)
HCT: 41.4 % (ref 39.0–52.0)
Hemoglobin: 13.8 g/dL (ref 13.0–17.0)
Lymphocytes Relative: 46.2 % — ABNORMAL HIGH (ref 12.0–46.0)
Lymphs Abs: 1.8 K/uL (ref 0.7–4.0)
MCHC: 33.3 g/dL (ref 30.0–36.0)
MCV: 93.3 fl (ref 78.0–100.0)
Monocytes Absolute: 0.5 K/uL (ref 0.1–1.0)
Monocytes Relative: 11.8 % (ref 3.0–12.0)
Neutro Abs: 1.5 K/uL (ref 1.4–7.7)
Neutrophils Relative %: 38.6 % — ABNORMAL LOW (ref 43.0–77.0)
Platelets: 165 K/uL (ref 150.0–400.0)
RBC: 4.44 Mil/uL (ref 4.22–5.81)
RDW: 14.2 % (ref 11.5–15.5)
WBC: 3.8 K/uL — ABNORMAL LOW (ref 4.0–10.5)

## 2023-10-23 LAB — LIPID PANEL
Cholesterol: 133 mg/dL (ref 0–200)
HDL: 42.1 mg/dL (ref >39.00)
LDL Cholesterol: 75 mg/dL (ref 0–99)
NonHDL: 90.75
Total CHOL/HDL Ratio: 3
Triglycerides: 80 mg/dL (ref 0.0–149.0)
VLDL: 16 mg/dL (ref 0.0–40.0)

## 2023-10-23 LAB — COMPREHENSIVE METABOLIC PANEL WITH GFR
ALT: 57 U/L — ABNORMAL HIGH (ref 0–53)
AST: 46 U/L — ABNORMAL HIGH (ref 0–37)
Albumin: 4.4 g/dL (ref 3.5–5.2)
Alkaline Phosphatase: 27 U/L — ABNORMAL LOW (ref 39–117)
BUN: 20 mg/dL (ref 6–23)
CO2: 31 meq/L (ref 19–32)
Calcium: 9.3 mg/dL (ref 8.4–10.5)
Chloride: 101 meq/L (ref 96–112)
Creatinine, Ser: 0.97 mg/dL (ref 0.40–1.50)
GFR: 74.43 mL/min (ref >60.00)
Glucose, Bld: 97 mg/dL (ref 70–99)
Potassium: 4.4 meq/L (ref 3.5–5.1)
Sodium: 137 meq/L (ref 135–145)
Total Bilirubin: 0.8 mg/dL (ref 0.2–1.2)
Total Protein: 7.1 g/dL (ref 6.0–8.3)

## 2023-10-23 LAB — HEMOGLOBIN A1C: Hgb A1c MFr Bld: 6.3 % (ref 4.6–6.5)

## 2023-10-23 LAB — T4, FREE: Free T4: 0.7 ng/dL (ref 0.60–1.60)

## 2023-10-23 LAB — T3, FREE: T3, Free: 2.8 pg/mL (ref 2.3–4.2)

## 2023-10-23 LAB — TSH: TSH: 2.82 u[IU]/mL (ref 0.35–5.50)

## 2023-10-24 ENCOUNTER — Encounter: Payer: Self-pay | Admitting: Internal Medicine

## 2023-10-28 DIAGNOSIS — E0500 Thyrotoxicosis with diffuse goiter without thyrotoxic crisis or storm: Secondary | ICD-10-CM | POA: Diagnosis not present

## 2023-10-29 ENCOUNTER — Encounter: Payer: Self-pay | Admitting: Internal Medicine

## 2023-10-29 ENCOUNTER — Other Ambulatory Visit: Payer: Self-pay | Admitting: Family Medicine

## 2023-10-29 ENCOUNTER — Encounter: Payer: Self-pay | Admitting: Family Medicine

## 2023-10-29 ENCOUNTER — Ambulatory Visit: Admitting: Internal Medicine

## 2023-10-29 VITALS — BP 130/80 | HR 57 | Ht 68.5 in | Wt 173.0 lb

## 2023-10-29 DIAGNOSIS — E0590 Thyrotoxicosis, unspecified without thyrotoxic crisis or storm: Secondary | ICD-10-CM

## 2023-10-29 DIAGNOSIS — E0500 Thyrotoxicosis with diffuse goiter without thyrotoxic crisis or storm: Secondary | ICD-10-CM | POA: Diagnosis not present

## 2023-10-29 NOTE — Progress Notes (Signed)
 Name: Darius Long.  MRN/ DOB: [id number], [birth year]    Age/ Sex: 78 y.o., male     PCP: Katrinka Garnette KIDD, MD   Reason for Endocrinology Evaluation: Hyperthyroidism     Initial Endocrinology Clinic Visit: 03/15/2020    PATIENT IDENTIFIER: Darius Long. is a 78 y.o., male with a past medical history of HTN, dyslipidemia and OCD. He has followed with [facility] since 03/15/2020 for consultative assistance with management of his Hyperthyroidism      HISTORICAL SUMMARY:  They moved from [facility], [place] in May 2019  He was diagnosed with hypothyroidism ~ 15 yrs ago and was on LT-4  Until 02/03/2019     Over the summer/fall  of 2021 he has noted weight loss as well as decrease muscle mass. His LT-4 replacement has been gradually reduced until fully discontinued  On 02/03/2019   No amiodarone use   Methimazole  started 02/2020  TRAB elevated 18.65 iu/L    Thyroid  ultrasound showed sub centimeter left nodule that did not meet criteria for follow up 03/2020   No FH of thyroid  disease     He was referred by Dr. Octavia to [facility] (Dr. Addie Redfern) for further evaluation of diplopia.  CT of the orbits demonstrated medial and lateral rectus right eye much greater than left eye. The patient was diagnosed with TED.  Dr. Redfern he received Tepezza  infusions    SUBJECTIVE:     Today (10/29/2023):  [name] is here for Hyperthyroidism and Graves' disease.   Patient continues to follow-up with ophthalmology for TED, he received Tepezza  infusions which was approved 07/01/2023, he will complete a total of 8 infusions.  Weight has been stable Has noted decreased in right eye bulging, but continues with double vision  No local neck swelling  No recent URI  No palpitations  No constipation or diarrhea    Methimazole  5 mg ,1 tablet  on Mondays       HISTORY:  Past Medical History:  Past Medical History:  Diagnosis Date    Anxiety    Arthritis    Attention deficit disorder (ADD) in adult    Adderall 30mg  tablet- half tablet twice a day.    Chicken pox    Colon polyp    dad died of colon cancer- q5 year colonoscopy. 2018   Diabetes mellitus without complication (HCC)    Diverticulosis    Encounter for blood transfusion    related to overtreatment with PCN. became very ill.    Gall stones    Heart murmur    Sees cardiology October each year due to murmur after fall- get records. Seen for heart murmur heard 2010. We discused may not need to see cardiology. Every other year stres test   Hyperglycemia    a1c 5.9 in 03/2017   Hyperlipidemia    atorvastatin  40mg    Hypertension    losartan  50, hctz 12.5mg    Hypothyroidism    levothyroxine  100 mcg 6 days a week as of mid 2019.    Seasonal allergies    prn antihistamine   Past Surgical History:  Past Surgical History:  Procedure Laterality Date   Arthroscopic surgery Left 1986-1996   Damaged cartilage Left knee   BACK SURGERY Bilateral 2008   Injury due to a fall. 2 fractured vertebrae, 3 fractured ribs fracture L scapulla, mild concussion   CHOLECYSTECTOMY  2014   Relocation surgery Right    For right elbow ulna nerve  REPAIR of torn retina Right 2014   TONSILECTOMY, ADENOIDECTOMY, BILATERAL MYRINGOTOMY AND TUBES  1958   vena Laser   2009   Remaoval of varicous veins   Social History:  reports that he has never smoked. He has never used smokeless tobacco. He reports that he does not currently use alcohol. He reports that he does not use drugs. Family History:  Family History  Problem Relation Age of Onset   Arthritis Mother        passed at age 28   Miscarriages / [place] Mother    Hearing loss Father    Colon cancer Father        died at 12   Heart disease Paternal Grandmother    Other Daughter        diabetes educator   Miscarriages / [place] Daughter    Diabetes Mellitus I Daughter        in [place]    Esophageal cancer Neg Hx     Stomach cancer Neg Hx    Colon polyps Neg Hx    Rectal cancer Neg Hx      HOME MEDICATIONS: Allergies as of 10/29/2023       Reactions   Gramineae Pollens Other (See Comments)        Medication List        Accurate as of October 29, 2023  3:27 PM. If you have any questions, ask your nurse or doctor.          STOP taking these medications    methimazole  5 MG tablet Commonly known as: TAPAZOLE  Stopped by: Amyrie Illingworth J Sydna Brodowski       TAKE these medications    atorvastatin  40 MG tablet Commonly known as: LIPITOR TAKE 1 TABLET BY MOUTH DAILY   CHONDROITIN SULFATE PO Take 200 mg by mouth daily.   diclofenac  Sodium 1 % Gel Commonly known as: VOLTAREN  APPLY 2 GRAMS TO AFFECTED AREAS EVERY NIGHT AT BEDTIME   escitalopram  10 MG tablet Commonly known as: LEXAPRO  Take 1 tablet (10 mg total) by mouth daily.   latanoprost 0.005 % ophthalmic solution Commonly known as: XALATAN Place 1 drop into both eyes at bedtime.   metFORMIN  500 MG tablet Commonly known as: GLUCOPHAGE  TAKE 1/2 TABLET BY MOUTH DAILY WITH BREAKFAST   PROBIOTIC ADVANCED PO Take 2 tablets by mouth daily. 30 billion cfu's   Tepezza  500 MG injection Generic drug: teprotumumab -trbw Inject 1,570 mg into the vein. Start taking on: November 28, 2023   Turmeric (Curcuma Longa) Powd Take 1 capsule by mouth daily.   valsartan  320 MG tablet Commonly known as: DIOVAN  TAKE 1 TABLET BY MOUTH DAILY   vitamin E 180 MG (400 UNITS) capsule Take 800 Units by mouth daily.          OBJECTIVE:   PHYSICAL EXAM: VS: BP 130/80 (BP Location: Left Arm, Patient Position: Sitting, Cuff Size: Large)   Pulse (!) 57   Ht 5' 8.5 (1.74 m)   Wt 173 lb (78.5 kg)   SpO2 96%   BMI 25.92 kg/m    EXAM: General: Pt appears well and is in NAD  Neck: General: Supple without adenopathy. Thyroid : Left thyroid  nodule appreciated  Lungs: Clear with good BS bilat   Heart: Auscultation: RRR.  Extremities:  BL  LE: No pretibial edema   Mental Status: Judgment, insight: Intact Orientation: Oriented to time, place, and person Mood and affect: No depression, anxiety, or agitation     DATA REVIEWED:    Latest Reference  Range & Units 10/22/23 11:02  Sodium 135 - 145 mEq/L 137  Potassium 3.5 - 5.1 mEq/L 4.4  Chloride 96 - 112 mEq/L 101  CO2 19 - 32 mEq/L 31  Glucose 70 - 99 mg/dL 97  BUN 6 - 23 mg/dL 20  Creatinine 9.59 - 8.49 mg/dL 9.02  Calcium  8.4 - 10.5 mg/dL 9.3  Alkaline Phosphatase 39 - 117 U/L 27 (L)  Albumin 3.5 - 5.2 g/dL 4.4  AST 0 - 37 U/L 46 (H)  ALT 0 - 53 U/L 57 (H)  Total Protein 6.0 - 8.3 g/dL 7.1  Total Bilirubin 0.2 - 1.2 mg/dL 0.8  GFR >39.99 mL/min 74.43  Total CHOL/HDL Ratio  3  Cholesterol 0 - 200 mg/dL 866  HDL Cholesterol >60.99 mg/dL 57.89  LDL (calc) 0 - 99 mg/dL 75  NonHDL  09.24  Triglycerides 0.0 - 149.0 mg/dL 19.9  VLDL 0.0 - 59.9 mg/dL 83.9    Latest Reference Range & Units 10/22/23 11:02  TSH 0.35 - 5.50 uIU/mL 2.82  Triiodothyronine,Free,Serum 2.3 - 4.2 pg/mL 2.8  T4,Free(Direct) 0.60 - 1.60 ng/dL 9.29       Results for JAIYDEN, LAUR (MRN [id number]) as of 06/12/2020 09:57  Ref. Range 03/15/2020 12:16  TRAB Latest Ref Range: <=2.00 IU/L 18.65 (H)   Thyroid  ULtrasound 04/04/2020    Parenchymal Echotexture: Moderately heterogenous   Isthmus: 0.4 cm   Right lobe: 4.8 x 2.3 x 2.0 cm   Left lobe: 4.1 x 2.2 x 1.7 cm   _________________________________________________________   Estimated total number of nodules >/= 1 cm: 0   Number of spongiform nodules >/=  2 cm not described below (TR1): 0   Number of mixed cystic and solid nodules >/= 1.5 cm not described below (TR2): 0   _________________________________________________________   Nodule # 1:   Location: Left; Superior   Maximum size: 0.7 cm; Other 2 dimensions: 0.6 x 0.5 cm   Composition: solid/almost completely solid (2)   Echogenicity: isoechoic (1)   Shape: not  taller-than-wide (0)   Margins: smooth (0)   Echogenic foci: none (0)   ACR TI-RADS total points: 3.   ACR TI-RADS risk category: TR3 (3 points).   ACR TI-RADS recommendations:   Given size (<1.4 cm) and appearance, this nodule does NOT meet TI-RADS criteria for biopsy or dedicated follow-up.   _________________________________________________________   IMPRESSION: 1. Moderately heterogeneous, normal-sized thyroid  gland. 2. Single solid left upper thyroid  nodule (labeled 1, 0.7 cm) which does not meet criteria (TI-RADS category 3) for dedicated ultrasound follow-up or tissue sampling.   The above is in keeping with the ACR TI-RADS recommendations - J Am Coll Radiol 2017;14:587-595.   Ester Sides, MD   [facility]   [facility]     ASSESSMENT / PLAN / RECOMMENDATIONS:   Hyperthyroidism:   -Patient with Graves' disease  - Patient is clinically euthyroid - Most recent TFTs have been normal, he is currently on methimazole  5 mg 1 tablet a week, I have advised the patient to discontinue methimazole , he will have his lab to recheck on November, 25, 2025 through his PCPs office as he is already scheduled for more labs there - Patient was provided with printed orders to take to his PCPs lab appointment, to see if they can order it.    Medications  Stop methimazole  5 mg, 1  tablet once a week    2. Graves' Disease :  - Patient continues to follow-up with ophthalmology for  Graves' orbitopathy, he still has 2 more infusions of Tepezza     3. Left Thyroid  Nodule:  - No local neck symptoms - No follow-up was recommended  Follow-up in 4 months   Labs in 2 months   Signed electronically by: Stefano Redgie Butts, MD  [facility] Endocrinology  [facility] [address] [place] [place], [place] [address] Phone: [phone] FAX: [phone]      CC: Katrinka Garnette KIDD, MD [address]  [place] [address] Phone: [phone]  Fax: [phone]   Return to Endocrinology clinic as below: Future Appointments  Date Time Provider Department Center  12/16/2023  8:30 AM [facility] LAB [facility]ian  02/25/2024  9:50 AM Aalaysia Liggins, Donell Redgie, MD [facility] None  04/22/2024  1:00 PM [facility] ANNUAL WELLNESS VISIT 1 [facility]  04/28/2024 10:20 AM Katrinka Garnette KIDD, MD [facility]  09/30/2024  9:15 AM Alvia Norleen BIRCH, MD [facility] None

## 2023-10-29 NOTE — Patient Instructions (Signed)
 Please discontinue methimazole 

## 2023-11-03 ENCOUNTER — Other Ambulatory Visit: Payer: Self-pay | Admitting: Medical Genetics

## 2023-11-03 DIAGNOSIS — Z006 Encounter for examination for normal comparison and control in clinical research program: Secondary | ICD-10-CM

## 2023-11-28 DIAGNOSIS — E0500 Thyrotoxicosis with diffuse goiter without thyrotoxic crisis or storm: Secondary | ICD-10-CM | POA: Diagnosis not present

## 2023-11-30 LAB — GENECONNECT MOLECULAR SCREEN: Genetic Analysis Overall Interpretation: NEGATIVE

## 2023-12-03 ENCOUNTER — Encounter: Payer: Self-pay | Admitting: Family Medicine

## 2023-12-14 ENCOUNTER — Other Ambulatory Visit: Payer: Self-pay | Admitting: Family Medicine

## 2023-12-15 DIAGNOSIS — D2262 Melanocytic nevi of left upper limb, including shoulder: Secondary | ICD-10-CM | POA: Diagnosis not present

## 2023-12-15 DIAGNOSIS — L853 Xerosis cutis: Secondary | ICD-10-CM | POA: Diagnosis not present

## 2023-12-15 DIAGNOSIS — D1801 Hemangioma of skin and subcutaneous tissue: Secondary | ICD-10-CM | POA: Diagnosis not present

## 2023-12-15 DIAGNOSIS — L821 Other seborrheic keratosis: Secondary | ICD-10-CM | POA: Diagnosis not present

## 2023-12-16 ENCOUNTER — Other Ambulatory Visit

## 2023-12-23 DIAGNOSIS — E0500 Thyrotoxicosis with diffuse goiter without thyrotoxic crisis or storm: Secondary | ICD-10-CM | POA: Diagnosis not present

## 2023-12-30 ENCOUNTER — Encounter: Payer: Self-pay | Admitting: Family Medicine

## 2023-12-30 ENCOUNTER — Other Ambulatory Visit (HOSPITAL_COMMUNITY): Payer: Self-pay

## 2023-12-30 MED ORDER — COVID-19 MRNA VAC-TRIS(PFIZER) 30 MCG/0.3ML IM SUSY
0.3000 mL | PREFILLED_SYRINGE | Freq: Once | INTRAMUSCULAR | 0 refills | Status: AC
  Filled 2023-12-30: qty 0.3, 1d supply, fill #0

## 2023-12-30 NOTE — Telephone Encounter (Signed)
 Already updated

## 2024-02-17 ENCOUNTER — Ambulatory Visit: Admitting: Internal Medicine

## 2024-02-18 ENCOUNTER — Ambulatory Visit: Admitting: Internal Medicine

## 2024-02-20 ENCOUNTER — Telehealth: Admitting: Internal Medicine

## 2024-02-20 ENCOUNTER — Encounter: Payer: Self-pay | Admitting: Internal Medicine

## 2024-02-20 VITALS — Ht 68.5 in | Wt 173.0 lb

## 2024-02-20 DIAGNOSIS — E0500 Thyrotoxicosis with diffuse goiter without thyrotoxic crisis or storm: Secondary | ICD-10-CM

## 2024-02-20 DIAGNOSIS — E0590 Thyrotoxicosis, unspecified without thyrotoxic crisis or storm: Secondary | ICD-10-CM | POA: Diagnosis not present

## 2024-02-20 NOTE — Progress Notes (Signed)
 " Virtual Visit via Video Note  I connected with Darius Long. on 02/20/24 at  7:30 AM EST by a video enabled telemedicine application and verified that I am speaking with the correct person using two identifiers.   I discussed the limitations of evaluation and management by telemedicine and the availability of in person appointments. The patient expressed understanding and agreed to proceed.   -Location of the patient : home  -Location of the provider : Office -The names of all persons participating in the telemedicine service : Pt and myself        Name: Darius Long.  MRN/ DOB: [id number], Nov 21, 1945    Age/ Sex: 79 y.o., male     PCP: Katrinka Garnette KIDD, MD   Reason for Endocrinology Evaluation: Hyperthyroidism     Initial Endocrinology Clinic Visit: 03/15/2020    PATIENT IDENTIFIER: Darius Long. is a 79 y.o., male with a past medical history of HTN, dyslipidemia and OCD. He has followed with [facility] since 03/15/2020 for consultative assistance with management of his Hyperthyroidism      HISTORICAL SUMMARY:  They moved from [facility], [place] in May 2019  He was diagnosed with hypothyroidism ~ 15 yrs ago and was on LT-4  Until 02/03/2019     Over the summer/fall  of 2021 he has noted weight loss as well as decrease muscle mass. His LT-4 replacement has been gradually reduced until fully discontinued  On 02/03/2019   No amiodarone use   Methimazole  started 02/2020  TRAB elevated 18.65 iu/L    Thyroid  ultrasound showed sub centimeter left nodule that did not meet criteria for follow up 03/2020   No FH of thyroid  disease     He was referred by Dr. Octavia to [facility] (Dr. Addie Redfern) for further evaluation of diplopia.  CT of the orbits demonstrated medial and lateral rectus right eye much greater than left eye. The patient was diagnosed with TED.  Dr. Redfern, received Tepezza  infusions  Completed 8/8  infusion 12/2023    Discontinued Methimazole  12/23/2023   SUBJECTIVE:     Today (02/20/2024):  Darius Long is here for Hyperthyroidism and Graves' disease.   Patient continues to follow-up with ophthalmology for TED, he completed Tepezza  infusions in 12/2023  He has been off methimazole  since 10/2023  He has noted decrease in right eye swelling, but continues with double vision, he has another follow-up and 4 months, he recently received new eyeglasses   No significant change in weight No local neck symptoms No palpitations No tremors, but has noted fingernail changes with being brittle and vertical grooving, he had seen dermatology when he had it only in his thumb, but now spreading over the rest of his fingers, this has been noted after his last visit from [place] , patient may use OTC nail strengthening polish No changes in bowel movements    HISTORY:  Past Medical History:  Past Medical History:  Diagnosis Date   Anxiety    Arthritis    Attention deficit disorder (ADD) in adult    Adderall 30mg  tablet- half tablet twice a day.    Chicken pox    Colon polyp    dad died of colon cancer- q5 year colonoscopy. 2018   Diabetes mellitus without complication (HCC)    Diverticulosis    Encounter for blood transfusion    related to overtreatment with PCN. became very ill.    Gall stones    Heart murmur  Sees cardiology October each year due to murmur after fall- get records. Seen for heart murmur heard 2010. We discused may not need to see cardiology. Every other year stres test   Hyperglycemia    a1c 5.9 in 03/2017   Hyperlipidemia    atorvastatin  40mg    Hypertension    losartan  50, hctz 12.5mg    Hypothyroidism    levothyroxine  100 mcg 6 days a week as of mid 2019.    Seasonal allergies    prn antihistamine   Past Surgical History:  Past Surgical History:  Procedure Laterality Date   Arthroscopic surgery Left 1986-1996   Damaged cartilage Left knee   BACK SURGERY  Bilateral 2008   Injury due to a fall. 2 fractured vertebrae, 3 fractured ribs fracture L scapulla, mild concussion   CHOLECYSTECTOMY  2014   Relocation surgery Right    For right elbow ulna nerve    REPAIR of torn retina Right 2014   TONSILECTOMY, ADENOIDECTOMY, BILATERAL MYRINGOTOMY AND TUBES  1958   vena Laser   2009   Remaoval of varicous veins   Social History:  reports that he has never smoked. He has never used smokeless tobacco. He reports that he does not currently use alcohol. He reports that he does not use drugs. Family History:  Family History  Problem Relation Age of Onset   Arthritis Mother        passed at age 27   Miscarriages / Stillbirths Mother    Hearing loss Father    Colon cancer Father        died at 30   Heart disease Paternal Grandmother    Other Daughter        diabetes educator   Miscarriages / Stillbirths Daughter    Diabetes Mellitus I Daughter        in [place]    Esophageal cancer Neg Hx    Stomach cancer Neg Hx    Colon polyps Neg Hx    Rectal cancer Neg Hx      HOME MEDICATIONS: Allergies as of 02/20/2024       Reactions   Gramineae Pollens Other (See Comments)        Medication List        Accurate as of February 20, 2024  6:59 AM. If you have any questions, ask your nurse or doctor.          atorvastatin  40 MG tablet Commonly known as: LIPITOR TAKE 1 TABLET BY MOUTH DAILY   CHONDROITIN SULFATE PO Take 200 mg by mouth daily.   diclofenac  Sodium 1 % Gel Commonly known as: VOLTAREN  APPLY 2 GRAMS TO AFFECTED AREAS EVERY NIGHT AT BEDTIME   escitalopram  10 MG tablet Commonly known as: LEXAPRO  Take 1 tablet (10 mg total) by mouth daily.   latanoprost 0.005 % ophthalmic solution Commonly known as: XALATAN Place 1 drop into both eyes at bedtime.   metFORMIN  500 MG tablet Commonly known as: GLUCOPHAGE  TAKE 1/2 TABLET BY MOUTH DAILY WITH BREAKFAST   PROBIOTIC ADVANCED PO Take 2 tablets by mouth daily. 30 billion  cfu's   Tepezza  500 MG injection Generic drug: teprotumumab -trbw Inject 1,570 mg into the vein.   Turmeric (Curcuma Longa) Powd Take 1 capsule by mouth daily.   valsartan  320 MG tablet Commonly known as: DIOVAN  TAKE 1 TABLET BY MOUTH DAILY   vitamin E 180 MG (400 UNITS) capsule Take 800 Units by mouth daily.          OBJECTIVE:   PHYSICAL  EXAM: VS: There were no vitals taken for this visit.   EXAM: General: Pt appears well and is in NAD  Mental Status: Judgment, insight: Intact Orientation: Oriented to time, place, and person Mood and affect: No depression, anxiety, or agitation     DATA REVIEWED:    Latest Reference Range & Units 10/22/23 11:02  Sodium 135 - 145 mEq/L 137  Potassium 3.5 - 5.1 mEq/L 4.4  Chloride 96 - 112 mEq/L 101  CO2 19 - 32 mEq/L 31  Glucose 70 - 99 mg/dL 97  BUN 6 - 23 mg/dL 20  Creatinine 9.59 - 8.49 mg/dL 9.02  Calcium  8.4 - 10.5 mg/dL 9.3  Alkaline Phosphatase 39 - 117 U/L 27 (L)  Albumin 3.5 - 5.2 g/dL 4.4  AST 0 - 37 U/L 46 (H)  ALT 0 - 53 U/L 57 (H)  Total Protein 6.0 - 8.3 g/dL 7.1  Total Bilirubin 0.2 - 1.2 mg/dL 0.8  GFR >39.99 mL/min 74.43  Total CHOL/HDL Ratio  3  Cholesterol 0 - 200 mg/dL 866  HDL Cholesterol >60.99 mg/dL 57.89  LDL (calc) 0 - 99 mg/dL 75  NonHDL  09.24  Triglycerides 0.0 - 149.0 mg/dL 19.9  VLDL 0.0 - 59.9 mg/dL 83.9    Latest Reference Range & Units 10/22/23 11:02  TSH 0.35 - 5.50 uIU/mL 2.82  Triiodothyronine,Free,Serum 2.3 - 4.2 pg/mL 2.8  T4,Free(Direct) 0.60 - 1.60 ng/dL 9.29       Results for EBONY, RICKEL (MRN [id number]) as of 06/12/2020 09:57  Ref. Range 03/15/2020 12:16  TRAB Latest Ref Range: <=2.00 IU/L 18.65 (H)   Thyroid  ULtrasound 04/04/2020    Parenchymal Echotexture: Moderately heterogenous   Isthmus: 0.4 cm   Right lobe: 4.8 x 2.3 x 2.0 cm   Left lobe: 4.1 x 2.2 x 1.7 cm   _________________________________________________________   Estimated total  number of nodules >/= 1 cm: 0   Number of spongiform nodules >/=  2 cm not described below (TR1): 0   Number of mixed cystic and solid nodules >/= 1.5 cm not described below (TR2): 0   _________________________________________________________   Nodule # 1:   Location: Left; Superior   Maximum size: 0.7 cm; Other 2 dimensions: 0.6 x 0.5 cm   Composition: solid/almost completely solid (2)   Echogenicity: isoechoic (1)   Shape: not taller-than-wide (0)   Margins: smooth (0)   Echogenic foci: none (0)   ACR TI-RADS total points: 3.   ACR TI-RADS risk category: TR3 (3 points).   ACR TI-RADS recommendations:   Given size (<1.4 cm) and appearance, this nodule does NOT meet TI-RADS criteria for biopsy or dedicated follow-up.   _________________________________________________________   IMPRESSION: 1. Moderately heterogeneous, normal-sized thyroid  gland. 2. Single solid left upper thyroid  nodule (labeled 1, 0.7 cm) which does not meet criteria (TI-RADS category 3) for dedicated ultrasound follow-up or tissue sampling.   The above is in keeping with the ACR TI-RADS recommendations - J Am Coll Radiol 2017;14:587-595.   Darius Sides, MD   [facility]   [facility]     ASSESSMENT / PLAN / RECOMMENDATIONS:   Hyperthyroidism:   -Patient with Graves' disease  - Patient is clinically euthyroid - He has been off methimazole  since October, 2025 - Patient will return for TFT checkup next Tuesday    2. Graves' Disease :  - Patient continues to follow-up with ophthalmology for Graves' orbitopathy, he completed Tepezza  in December, 2025    Signed electronically by: Stefano  Redgie Butts, MD  [facility]  [facility] [address] [place], [place] [address] Phone: [phone] FAX: [phone]      CC: Katrinka Garnette KIDD, MD [address] [place] [address] Phone:  [phone]  Fax: [phone]   Return to Endocrinology clinic as below: Future Appointments  Date Time Provider Department Center  02/20/2024  7:30 AM Capria Cartaya, Donell Redgie, MD [facility] None  04/22/2024  1:00 PM [facility] ANNUAL WELLNESS VISIT 1 [facility] Darius Long  04/28/2024 10:20 AM Katrinka Garnette KIDD, MD [facility] Darius Long  09/30/2024  9:15 AM Alvia Norleen BIRCH, MD [facility] None    "

## 2024-02-24 ENCOUNTER — Other Ambulatory Visit

## 2024-02-25 ENCOUNTER — Ambulatory Visit: Payer: Self-pay | Admitting: Internal Medicine

## 2024-02-25 ENCOUNTER — Ambulatory Visit: Admitting: Internal Medicine

## 2024-02-25 ENCOUNTER — Encounter: Payer: Self-pay | Admitting: Internal Medicine

## 2024-02-25 LAB — TSH: TSH: 2.15 m[IU]/L (ref 0.40–4.50)

## 2024-02-25 LAB — T3, FREE: T3, Free: 3 pg/mL (ref 2.3–4.2)

## 2024-02-25 LAB — T4, FREE: Free T4: 1 ng/dL (ref 0.8–1.8)

## 2024-02-26 NOTE — Progress Notes (Signed)
 Darius Long.                                          MRN: [id number]   02/26/2024   The [facility] Quality Team Specialist reviewed this patient medical record for the purposes of chart review for care gap closure. The following were reviewed: chart review for care gap closure-kidney health evaluation for diabetes:eGFR  and uACR.    [facility]BCI Quality Team

## 2024-04-22 ENCOUNTER — Ambulatory Visit

## 2024-04-28 ENCOUNTER — Ambulatory Visit: Admitting: Family Medicine

## 2024-08-18 ENCOUNTER — Ambulatory Visit: Admitting: Internal Medicine

## 2024-09-30 ENCOUNTER — Encounter (INDEPENDENT_AMBULATORY_CARE_PROVIDER_SITE_OTHER): Admitting: Ophthalmology
